# Patient Record
Sex: Male | Born: 1945 | Race: Black or African American | Hispanic: No | Marital: Single | Smoking: Current every day smoker
Health system: Southern US, Community
[De-identification: ages and names within clinical notes are randomized; demographics above are authoritative.]

## PROBLEM LIST (undated history)

## (undated) DIAGNOSIS — N170 Acute kidney failure with tubular necrosis: Secondary | ICD-10-CM

## (undated) DIAGNOSIS — R0602 Shortness of breath: Secondary | ICD-10-CM

## (undated) DIAGNOSIS — I469 Cardiac arrest, cause unspecified: Secondary | ICD-10-CM

## (undated) DIAGNOSIS — G43909 Migraine, unspecified, not intractable, without status migrainosus: Secondary | ICD-10-CM

## (undated) DIAGNOSIS — J9621 Acute and chronic respiratory failure with hypoxia: Secondary | ICD-10-CM

## (undated) DIAGNOSIS — S62109A Fracture of unspecified carpal bone, unspecified wrist, initial encounter for closed fracture: Secondary | ICD-10-CM

## (undated) DIAGNOSIS — B2790 Infectious mononucleosis, unspecified without complication: Secondary | ICD-10-CM

## (undated) DIAGNOSIS — D229 Melanocytic nevi, unspecified: Secondary | ICD-10-CM

## (undated) DIAGNOSIS — K635 Polyp of colon: Secondary | ICD-10-CM

## (undated) DIAGNOSIS — R413 Other amnesia: Secondary | ICD-10-CM

## (undated) DIAGNOSIS — I482 Chronic atrial fibrillation, unspecified: Secondary | ICD-10-CM

## (undated) DIAGNOSIS — R21 Rash and other nonspecific skin eruption: Secondary | ICD-10-CM

## (undated) DIAGNOSIS — I10 Essential (primary) hypertension: Secondary | ICD-10-CM

## (undated) DIAGNOSIS — G931 Anoxic brain damage, not elsewhere classified: Secondary | ICD-10-CM

## (undated) HISTORY — DX: Melanocytic nevi, unspecified: D22.9

## (undated) HISTORY — DX: Other amnesia: R41.3

## (undated) HISTORY — DX: Essential (primary) hypertension: I10

## (undated) HISTORY — DX: Infectious mononucleosis, unspecified without complication: B27.90

## (undated) HISTORY — DX: Fracture of unspecified carpal bone, unspecified wrist, initial encounter for closed fracture: S62.109A

## (undated) HISTORY — DX: Migraine, unspecified, not intractable, without status migrainosus: G43.909

## (undated) HISTORY — PX: TRIGGER FINGER RELEASE: SHX641

## (undated) HISTORY — DX: Shortness of breath: R06.02

## (undated) HISTORY — DX: Polyp of colon: K63.5

## (undated) HISTORY — DX: Rash and other nonspecific skin eruption: R21

---

## 2000-10-24 ENCOUNTER — Emergency Department (HOSPITAL_COMMUNITY): Admission: EM | Admit: 2000-10-24 | Discharge: 2000-10-24 | Payer: Self-pay | Admitting: Emergency Medicine

## 2000-10-25 ENCOUNTER — Ambulatory Visit (HOSPITAL_COMMUNITY): Admission: RE | Admit: 2000-10-25 | Discharge: 2000-10-25 | Payer: Self-pay

## 2002-06-22 ENCOUNTER — Ambulatory Visit (HOSPITAL_COMMUNITY): Admission: RE | Admit: 2002-06-22 | Discharge: 2002-06-22 | Payer: Self-pay | Admitting: Gastroenterology

## 2002-06-22 ENCOUNTER — Encounter (INDEPENDENT_AMBULATORY_CARE_PROVIDER_SITE_OTHER): Payer: Self-pay | Admitting: *Deleted

## 2004-06-05 ENCOUNTER — Encounter: Admission: RE | Admit: 2004-06-05 | Discharge: 2004-06-05 | Payer: Self-pay | Admitting: Gastroenterology

## 2005-01-13 ENCOUNTER — Ambulatory Visit (HOSPITAL_COMMUNITY): Admission: RE | Admit: 2005-01-13 | Discharge: 2005-01-13 | Payer: Self-pay | Admitting: Gastroenterology

## 2005-01-13 ENCOUNTER — Encounter (INDEPENDENT_AMBULATORY_CARE_PROVIDER_SITE_OTHER): Payer: Self-pay | Admitting: *Deleted

## 2007-04-02 ENCOUNTER — Emergency Department (HOSPITAL_COMMUNITY): Admission: EM | Admit: 2007-04-02 | Discharge: 2007-04-02 | Payer: Self-pay | Admitting: Emergency Medicine

## 2007-07-31 ENCOUNTER — Ambulatory Visit: Payer: Self-pay | Admitting: Pulmonary Disease

## 2007-08-15 ENCOUNTER — Ambulatory Visit (HOSPITAL_BASED_OUTPATIENT_CLINIC_OR_DEPARTMENT_OTHER): Admission: RE | Admit: 2007-08-15 | Discharge: 2007-08-15 | Payer: Self-pay | Admitting: Pulmonary Disease

## 2007-08-18 ENCOUNTER — Ambulatory Visit: Payer: Self-pay | Admitting: Pulmonary Disease

## 2009-07-25 ENCOUNTER — Encounter: Payer: Self-pay | Admitting: Internal Medicine

## 2009-08-23 DIAGNOSIS — E785 Hyperlipidemia, unspecified: Secondary | ICD-10-CM

## 2009-08-23 DIAGNOSIS — G43909 Migraine, unspecified, not intractable, without status migrainosus: Secondary | ICD-10-CM | POA: Insufficient documentation

## 2009-08-23 DIAGNOSIS — I10 Essential (primary) hypertension: Secondary | ICD-10-CM | POA: Insufficient documentation

## 2009-09-03 ENCOUNTER — Encounter (INDEPENDENT_AMBULATORY_CARE_PROVIDER_SITE_OTHER): Payer: Self-pay | Admitting: *Deleted

## 2010-12-28 ENCOUNTER — Encounter: Payer: Self-pay | Admitting: Family Medicine

## 2011-04-20 NOTE — Letter (Signed)
July 31, 2007    Gabriel Earing, M.D.  [address]  [place], [place] [address]   RE:  Christopher [name], Christopher [name]  MRN:  [id number]  /  DOB:  Dec 06, 1946   Dear Dr. Andi Devon:   Thank you for this referral.  As you are aware, Christopher [name] is a pleasant  65 year old African-American gentleman who works as a Administrator at the  [employer].  He suffers from terrible migraines for the past several  years and he recently had an episode where he almost passed out.  He  describes choking episodes that have, on occasion, woken him up from  sleep.  These have been witnessed by his girlfriend.  However, she has  never described apneas.  He reports that his Epworth sleepiness score is  12 to 15, which means that he is a very sleepy person.  His usual  bedtime is anywhere from 10 p.m. to midnight.  Sleep latency is about 15  minutes.  He awakens a couple of times during the night, including  bathroom visits without any post-void sleep latency.  He wakes up at  5:15 a.m. feeling tired and groggy, but is able to get going within an  hour's time.  On weekends, he will stay in bed until 7 a.m.  On  occasions, migraines have woken him up.  He drinks about 2 cups of  coffee per day and is trying to decrease this.  He describes vivid  dreams after he started taking the Neurontin and has now cut down to 1/2  tablet a day.  He says that he works outdoors all day.  He will come  home and take a nap for about an hour or 2 every day.  He has gained  about 5 pounds in the past 2 years.   PAST MEDICAL HISTORY:  Includes hypertension, hyperlipidemia, migraine  headaches that seem to have returned since he came back to reside in  [place] in 1997.   ALLERGIES:  PENICILLIN.   CURRENT MEDICATIONS:  1. Gabapentin 300 mg b.i.d.  2. Valtrex 400 mg daily.  3. Lisinopril hydrochlorothiazide 10/12.5 mg daily.  4. Omeprazole 20 mg daily.  5. Aspirin 81 mg daily.  6. Levitra 20 mg p.r.n.  7. Baclofen 10 mg daily.   SOCIAL HISTORY:  He now smokes about a pack per day.  Has smoked about  50 pack years.  He is divorced and lives with his girlfriend.   FAMILY HISTORY:  Heart disease in his mother, emphysema in his maternal  uncle.   REVIEW OF SYSTEMS:  Includes loud snoring, sleepiness during various  activities and during driving.  There is no indigestion, teeth grinding  or jaw tightening in his sleep.   PHYSICAL EXAM:  Weight 182 pounds, blood pressure 150/82.  Heart rate is  75 per minute, oxygen saturation is 94% on room air.  HEENT:  Normal oropharyngeal space.  NECK:  Circumference 17 inches.  CVS:  S1, S2 normal.  CHEST:  Faint rhonchi in the left upper lobe.  Otherwise, clear to  auscultation.  ABDOMEN:  Soft.  NEUROLOGIC:  Nonfocal.  EXTREMITIES:  No edema.   MRI showed small vessel ischemic changes.   IMPRESSION AND PLAN:  1. Given his hypersomnolence and chocking episodes in his sleep,      obstructive sleep apnea is probable and an overnight polysomnogram      will be scheduled.  The pathophysiology of sleep apnea, its      cardiovascular consequences,  and modes of treatment, including CPAP      were discussed with the patient in detail.  Some reading material      was provided.  2. Given his extensive smoking, spirometry will be scheduled at a      future date.  Smoking cessation was discussed.  He will try to      decrease by 1 cigarette every week, hoping to quit by Christmas.    Sincerely,      Christopher Milch, MD  Electronically Signed    RVA/MedQ  DD: 07/31/2007  DT: 08/01/2007  Job #: [id number]   CC:    Gabriel Earing, M.D.

## 2011-04-20 NOTE — Procedures (Signed)
NAMEANTHONEY, Christopher [name]              ACCOUNT NO.:  [id number]   MEDICAL RECORD NO.:  [id number]          PATIENT TYPE:  OUT   LOCATION:  SLEEP CENTER                 FACILITY:  [facility]   PHYSICIAN:  Oretha Milch, MD      DATE OF BIRTH:  [birth year]   DATE OF STUDY:  08/15/2007                            NOCTURNAL POLYSOMNOGRAM   REFERRING PHYSICIAN:   SUBJECTIVE DATA:  Christopher [name] is a 65 year old smoker with excessive  daytime somnolence, loud snoring and choking episodes during sleep.  Height is 5 feet 8 inches, weight is 176 pounds.   EPWORTH SLEEPINESS SCORE:  12 and neck size 16 inches.   MEDICATIONS:  At home include lisinopril, Valtrex, aspirin.   This intervention polysomnogram was performed with a sleep technologist  in attendance.  EEG, EOG, EMG, EKG, respiratory parameters, body  position and oximetry date were recorded.  Sleep stages, arousals and  limb movements were scored according to criteria from the American  Academy of Sleep Medicine.   SLEEP ARCHITECTURE:  Lights out was at 10:34 p.m., and lights on was  5:36 a.m.  Sleep period time was 363 minutes, with a total sleep time of  318 minutes, leading to a sleep maintenance efficiency of 88% and awake  during sleep of 46.5 minutes.  Sleep onset latency was 37.5 minutes.  REM latency was 48 minutes.  Sleep states as a percentage of total sleep  time was 9% in one sleep, 66% in two, zero percent in three and 25% of  REM sleep.  Cyclic REM periods were seen throughout the night.   AROUSAL DATA:  There were a total of 49 arousals, of which 42 were  nonspecific, and 7 were associated with a respiratory event.   RESPIRATORY PARAMETERS:  During the baseline period of 180.5 minutes,  132 minutes of sleep were recorded, including 26 minutes of REM sleep  and 121 minutes of supine sleep.  There were a total of 11 obstructive  apneas, 4 central apneas, zero mixed apneas and 34 hypopneas, leading to  a total AHI of 22.3 per  hour, with an arousal index of 14.5 per hour and  a lowest desaturation of 83% during non-REM sleep and 55% during REM  sleep.   Due to this degree of respiratory disturbance, CPAP was initiated at +5  centimeters, at +6 centimeters for 21.9 minutes of non-REM sleep.  The  patient did not have any events.  CPAP was titrated due to snoring, to  +9 centimeters.  At this level, he achieved 37 minutes of REM sleep and  52 minutes of non-REM sleep and spent 15.8 minutes supine.  No events  were recorded.   OXYGEN DATA:  The lowest saturation was 84% during REM sleep with a  respiratory event.  He spent a total 53.2 minutes, with a saturation  less than 90%.   CARDIAC DATA:  The lowest heart rate during non-REM sleep was 45 beats  per minute and 50 beats per minute during REM sleep.  Highest heart rate  was 83 beats per minutes during non-REM sleep and REM sleep.   PERIODIC LIMB MOVEMENT DATA:  Four limb  movements were noted, and these  are not associated with arousal.   DISCUSSION:  He seemed to tolerate the CPAP well, after an initial  period of restlessness.  A full facial Fisher Paykel mask was preferred.  ST depression was noted in the EKG lead.   IMPRESSION:  1. Moderate obstructive sleep apnea and hypopnea, causing arousals and      sleep fragmentation.  This was corrected with a CPAP of +9 cm,      which he seemed to tolerate quite well.  2. No significant periodic limb movements are seen.  3. No arrhythmias are noted.  ST depression was noted on the EKG lead.      Please correlate with history of cardiac ischemia and a 12-lead      EKG.   RECOMMENDATION:  1. CPAP of +9 centimeters with full face Fisher Paykel mask.  He      should be followed up to ensure compliance.  2. Maintain lean body weight.      Oretha Milch, MD  Electronically Signed     RVA/MEDQ  D:  08/18/2007 13:26:05  T:  08/19/2007 09:39:29  Job:  [id number]   cc:   Gabriel Earing, M.D.  Fax: [phone]

## 2011-04-23 NOTE — Op Note (Signed)
[name], [name]              ACCOUNT NO.:  [id number]   MEDICAL RECORD NO.:  [id number]          PATIENT TYPE:  AMB   LOCATION:  ENDO                         FACILITY:  [facility]   PHYSICIAN:  Anselmo Rod, M.D.  DATE OF BIRTH:  07-18-1946   DATE OF PROCEDURE:  01/13/2005  DATE OF DISCHARGE:                                 OPERATIVE REPORT   PROCEDURE PERFORMED:  Esophagogastroduodenoscopy with multiple biopsies.   ENDOSCOPIST:  Charna Elizabeth, M.D.   INSTRUMENT USED:  Olympus video panendoscope.   INDICATIONS FOR PROCEDURE:  The patient is a 65 year old African-American  male with a history of dysphagia and a history of reflux, rule out peptic  stricture, esophagitis, etc.  The patient had a barium swallow that showed  some decreased motility in the proximal esophagus.  Manometry today revealed  slightly elevated upper esophageal sphincter pressures but no evidence of  achalasia.   PREPROCEDURE PREPARATION:  Informed consent was procured from the patient.  The patient was fasted for eight hours prior to the procedure.   PREPROCEDURE PHYSICAL:  The patient had stable vital signs.  Neck supple,  chest clear to auscultation.  S1, S2 regular.  Abdomen soft with normal  bowel sounds.   DESCRIPTION OF PROCEDURE:  The patient was placed in the left lateral  decubitus position and sedated with 40 mg of Demerol and 4 mg of Versed in  slow incremental doses.  Once the patient was adequately sedated and  maintained on low-flow oxygen and continuous cardiac monitoring, the Olympus  video panendoscope was advanced through the mouth piece over the tongue into  the esophagus under direct vision.  The entire esophagus appeared widely  patent with no evidence of stricture, or esophagitis.  There was Barrett's  like mucosa noted in the distal esophagus.  It was biopsied for pathology.  There was diffuse gastritis noted throughout the gastric mucosa with more  prominent changes in the  proximal stomach that was biopsied for pathology as  well to rule out Helicobacter pylori.  Two small sessile  polyps were seen  in the antrum and were biopsied as well.  Proximal small bowel showed severe  duodenitis in the duodenal bulb with small linear ulceration.  Small bowel  distal to the bulb appeared normal up to 60 cm.   IMPRESSION:  1.  Widely patent esophagus.  2.  Barrett's like changes in the distal esophagus, biopsies done, results      pending.  3.  Diffuse gastritis, biopsies done to rule out Helicobacter pylori from      proximal stomach.  4.  Two small sessile polyps from the antrum biopsied for pathology.  5.  Severe duodenitis in the duodenal bulb.  6.  Normal small bowel distal to the bulb up to 60 cm.   RECOMMENDATIONS:  1.  Await pathology results.  2.  Avoid all nonsteroidals including aspirin for now.  3.  Outpatient followup in the next two weeks for further recommendation.      JNM/MEDQ  D:  01/13/2005  T:  01/13/2005  Job:  [id number]   cc:  Gabriel Earing, M.D.  [address]  [place]  [place] [address]  Fax: [phone]   Kathy Breach, M.D.  [address] [place]  [place] [address]  Fax: [phone]

## 2011-04-23 NOTE — Procedures (Signed)
[place]  Patient:    Christopher [name], Christopher [name] Visit Number: [id number] MRN: [id number]          Service Type: END Location: ENDO Attending Physician:  Charna Elizabeth Dictated by:   Anselmo Rod, M.D. Proc. Date: 06/22/02 Admit Date:  06/22/2002 Discharge Date: 06/22/2002   CC:         Earlene Plater L. Cloward, M.D.   Procedure Report  DATE OF BIRTH:  [birth year].  PROCEDURE:  Colonoscopy with snare polypectomy x6.  ENDOSCOPIST:  Anselmo Rod, M.D.  INSTRUMENT USED:  Olympus video colonoscope.  INDICATION FOR PROCEDURE:  A 65 year old African-American male with a history of rectal bleeding and a personal history of polyps removed in 1996.  Rule out recurrent polyps.  PREPROCEDURE PREPARATION:  Informed consent was procured from the patient. The patient was fasted for eight hours prior to the procedure and prepped with a bottle of magnesium citrate and a gallon of NuLytely the night prior to the procedure.  PREPROCEDURE PHYSICAL:  VITAL SIGNS:  The patient had stable vital signs.  NECK:  Supple.  CHEST:  Clear to auscultation.  S1, S2 regular.  ABDOMEN:  Soft with normal bowel sounds.  DESCRIPTION OF PROCEDURE:  The patient was placed in the left lateral decubitus position and sedated with 50 mg of Demerol and 6 mg of Versed intravenously.  Once the patient was adequately sedate and maintained on low-flow oxygen and continuous cardiac monitoring, the Olympus video colonoscope was advanced from the rectum to the cecum.  The patient had prominent bleeding internal hemorrhoids appreciated on retroflexion.  There was evidence of left-sided diverticulosis.  Multiple small sessile polyps were snared from 20-25 cm.  The rest of the colon up to the cecum appeared normal.  IMPRESSION: 1. Prominent bleeding internal hemorrhoids. 2. Left-sided diverticulosis. 3. Multiple polyps removed from 20-25 cm. 4. Otherwise normal-appearing colon,  including transverse colon, right    colon, cecum, and terminal ileum.  RECOMMENDATIONS: 1. Await pathology results. 2. Avoid all nonsteroidals, including aspirin, for the next three weeks. 3. Anusol-HC 2.5% suppositories one p.r. q.h.s., #30 with one refill have    been prescribed. 4. Sitz baths have been advocated. 5. Outpatient follow-up in the next seven to 10 days. Dictated by:   Anselmo Rod, M.D. Attending Physician:  Charna Elizabeth DD:  06/22/02 TD:  06/27/02 Job: [id number]

## 2011-06-07 ENCOUNTER — Encounter (INDEPENDENT_AMBULATORY_CARE_PROVIDER_SITE_OTHER): Payer: Self-pay | Admitting: General Surgery

## 2011-06-07 ENCOUNTER — Ambulatory Visit (INDEPENDENT_AMBULATORY_CARE_PROVIDER_SITE_OTHER): Payer: 59 | Admitting: General Surgery

## 2011-06-07 VITALS — BP 146/82 | HR 62 | Temp 96.3°F | Ht 68.0 in | Wt 183.6 lb

## 2011-06-07 DIAGNOSIS — D229 Melanocytic nevi, unspecified: Secondary | ICD-10-CM

## 2011-06-07 DIAGNOSIS — K649 Unspecified hemorrhoids: Secondary | ICD-10-CM

## 2011-06-07 DIAGNOSIS — D239 Other benign neoplasm of skin, unspecified: Secondary | ICD-10-CM

## 2011-06-07 NOTE — Progress Notes (Signed)
Subjective:     Patient ID: Christopher [name], male   DOB: [birth year], 65 y.o.   MRN: [id number]    BP 146/82  Pulse 62  Temp 96.3 F (35.7 C)  Ht 5\' 8"  (1.727 m)  Wt 183 lb 9.6 oz (83.28 kg)  BMI 27.92 kg/m2    HPI Comments: He is a 65 year old male referred by Dr. Andi Devon or evaluation of moles and hemorrhoids. He has a mole on his left shoulder that is getting larger. He has a mole on his left axilla is getting larger as well. He also noted a mole in his right groin area.  He has a history of hemorrhoidal disease. He had rubber band ligation of an internal hemorrhoid and 2009. He states when he strains to have a bowel movement he sometimes has bleeding, itching, and discomfort. If he does not strain they are asymptomatic.    Review of Systems  Constitutional: Negative.   Respiratory: Positive for shortness of breath.   Cardiovascular: Negative.   Genitourinary: Positive for genital sores.       Mole  Hematological: Negative.        Objective:   Physical Exam  Constitutional: He appears well-developed. No distress.       Overweight.  Genitourinary:       External hemorrhoid present with no thrombosis.  Anoscopy:  Small right anterolateral internal hemorrhoid; moderated size left internal hemorrhoid.  No fissure.  Skin: Skin is warm and dry.       1 cm dark irregular moles on left shoulder and axilla.  2 cm dark, irregular mole in right groin crease.       Assessment:     #1. Atypical skin moles.  #2. Hemorrhoidal disease only symptomatic when he has to strain to have a bowel movement.    Plan:     #1. Increase fiber in diet.  #2. Removal of atypical mole on left shoulder and axilla. Punch biopsy of atypical mole in the right groin. Will arrange for office surgery.

## 2011-07-01 ENCOUNTER — Ambulatory Visit (INDEPENDENT_AMBULATORY_CARE_PROVIDER_SITE_OTHER): Payer: 59 | Admitting: General Surgery

## 2014-05-27 ENCOUNTER — Encounter: Payer: Self-pay | Admitting: Podiatry

## 2014-05-27 ENCOUNTER — Ambulatory Visit: Payer: Medicare Other | Admitting: Podiatry

## 2014-05-27 VITALS — BP 138/60 | HR 70 | Resp 16 | Ht 68.0 in | Wt 178.0 lb

## 2014-05-27 DIAGNOSIS — L600 Ingrowing nail: Secondary | ICD-10-CM

## 2014-05-27 DIAGNOSIS — L84 Corns and callosities: Secondary | ICD-10-CM

## 2014-05-27 DIAGNOSIS — M216X9 Other acquired deformities of unspecified foot: Secondary | ICD-10-CM

## 2014-05-27 NOTE — Progress Notes (Signed)
Subjective:     Patient ID: Christopher [name], male   DOB: August 19, 1946, 68 y.o.   MRN: [id number]  Toe Pain    patient presents with ingrown toenail borders the medial and lateral hallux of both feet that can become painful and keratotic lesions distal lateral aspect of the fifth toes which she cannot cut and also can become tender along with the head of the fifth metatarsal bones   Review of Systems  All other systems reviewed and are negative.      Objective:   Physical Exam  Nursing note and vitals reviewed. Constitutional: He is oriented to person, place, and time.  Cardiovascular: Intact distal pulses.   Musculoskeletal: Normal range of motion.  Neurological: He is oriented to person, place, and time.  Skin: Skin is dry.   neurovascular status found to be intact with mild varicosities in the ankle region of both feet. Range of motion subtalar midtarsal joint adequate and muscle strength within normal limits. Patient is found to have incurvated hallux nails of both feet with procedure that looks like he was doing years ago with moderate spicule regrowth and also keratotic lesions fifth metatarsal both feet and distal lateral fifth toe both feet     Assessment:     Ingrown toenail deformity hallux of both feet along with plantarflexed metatarsal with keratotic lesion and distal lateral lesion secondary to digital rotation    Plan:     H&P and conditions. Patient wants to have procedures done ingrown toenails we are scheduling him next week and today I debrided all remaining tissue which she tolerated well and will be seen back as needed

## 2014-05-27 NOTE — Progress Notes (Signed)
   Subjective:    Patient ID: Christopher [name], male    DOB: 1946/07/18, 68 y.o.   MRN: [id number]  HPI Comments: "I have ingrown toenails"  Patient c/o tender 1st toes bilateral, both borders, right over left, for several months. Also, the 5th toes bilateral, lateral borders too. Usually gets regular pedicures to have them trim out the corners. Would like to have procedures to permanently fix, but maybe next week. Works at [employer] and has to work this whole week.  Toe Pain       Review of Systems  HENT: Positive for sinus pressure.   Respiratory: Positive for wheezing.   Skin: Positive for rash.  All other systems reviewed and are negative.      Objective:   Physical Exam        Assessment & Plan:

## 2014-06-24 ENCOUNTER — Encounter: Payer: Self-pay | Admitting: Podiatry

## 2014-06-24 ENCOUNTER — Ambulatory Visit (INDEPENDENT_AMBULATORY_CARE_PROVIDER_SITE_OTHER): Payer: Medicare Other | Admitting: Podiatry

## 2014-06-24 VITALS — BP 138/75 | HR 73 | Resp 16

## 2014-06-24 DIAGNOSIS — L600 Ingrowing nail: Secondary | ICD-10-CM

## 2014-06-24 MED ORDER — HYDROCODONE-ACETAMINOPHEN 10-325 MG PO TABS: 1.0000 | ORAL_TABLET | Freq: Three times a day (TID) | ORAL | Status: DC | PRN

## 2014-06-24 NOTE — Patient Instructions (Addendum)

## 2014-06-24 NOTE — Progress Notes (Signed)
Subjective:     Patient ID: Christopher [name], male   DOB: 22-Jun-1946, 68 y.o.   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 Patient wants procedures and today I infiltrated each hallux 60 mg Xylocaine Marcaine mixture remove the medial and lateral corners exposed matrix and apply chemical phenol 3 applications 30 seconds to each border followed by alcohol lavaged and sterile dressing. Reappoint to recheck

## 2016-07-15 ENCOUNTER — Other Ambulatory Visit: Payer: Self-pay | Admitting: Orthopedic Surgery

## 2016-07-15 DIAGNOSIS — M5417 Radiculopathy, lumbosacral region: Secondary | ICD-10-CM

## 2016-07-20 ENCOUNTER — Ambulatory Visit
Admission: RE | Admit: 2016-07-20 | Discharge: 2016-07-20 | Disposition: A | Discharge status: Home / Self Care | Payer: Medicare Other | Source: Ambulatory Visit | Attending: Orthopedic Surgery | Admitting: Orthopedic Surgery

## 2016-07-20 DIAGNOSIS — M5417 Radiculopathy, lumbosacral region: Secondary | ICD-10-CM

## 2016-12-09 ENCOUNTER — Ambulatory Visit: Payer: Medicare Other | Admitting: Neurology

## 2016-12-17 ENCOUNTER — Ambulatory Visit: Payer: Medicare Other | Admitting: Neurology

## 2017-01-04 ENCOUNTER — Ambulatory Visit (INDEPENDENT_AMBULATORY_CARE_PROVIDER_SITE_OTHER): Payer: Medicare Other | Admitting: Neurology

## 2017-01-04 ENCOUNTER — Encounter: Payer: Self-pay | Admitting: Neurology

## 2017-01-04 VITALS — BP 167/76 | HR 68 | Ht 68.0 in | Wt 181.5 lb

## 2017-01-04 DIAGNOSIS — R413 Other amnesia: Secondary | ICD-10-CM | POA: Insufficient documentation

## 2017-01-04 DIAGNOSIS — E538 Deficiency of other specified B group vitamins: Secondary | ICD-10-CM | POA: Diagnosis not present

## 2017-01-04 DIAGNOSIS — R5382 Chronic fatigue, unspecified: Secondary | ICD-10-CM | POA: Diagnosis not present

## 2017-01-04 HISTORY — DX: Other amnesia: R41.3

## 2017-01-04 NOTE — Patient Instructions (Signed)
   We will get blood work today, and get MRI of the brain. Let me know if you wish a medication for memory.

## 2017-01-04 NOTE — Progress Notes (Signed)
Reason for visit: Memory disturbance  Referring physician: Dr. Don Perking is a 71 y.o. male  History of present illness:  Christopher [name] is a 71 year old right-handed black male with a history of a memory disturbance that goes back 6-9 months. Christopher [name] has had increasing problems with repeating himself with questions. He has some short-term memory issues. Christopher [name] denies any issues with driving with directions, he is having some problems with focusing on where he is going, however. He does not get lost. Christopher [name] does have some mild word finding problems. He reports no issues with keeping up with finances. Sometimes he will have troubles remembering whether or not he took his medications, he denies any issues keeping up with appointments. Christopher [name] gets about 4 hours of sleep at night, he will often times wake up and cannot get back to sleep. Christopher [name] may sleep occasionally during Christopher daytime. Christopher [name] does not know whether or not he snores at night. He reports no numbness or weakness of Christopher extremities, he has some urgency of Christopher bladder, he denies problems controlling Christopher bowels. Both of his parents lived into Christopher late 35s or 78s with mild memory problems prior to their deaths. Otherwise, no significant history of memory issues are in Christopher family. He is sent to this office for an evaluation.  Past Medical History:  Diagnosis Date  . Colon polyp   . Hypertension   . 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 Migraine   . Mononucleosis 1990s  . Rash   . Shortness of breath ~1998   Pneumonia   . Skin moles    abnormal  . Wrist fracture    Childhood    Past Surgical History:  Procedure Laterality Date  . TRIGGER FINGER RELEASE Right     Family History  Problem Relation Age of Onset  . Heart disease Mother     Died at age 59  . Parkinson's disease Mother   . Parkinson's disease Brother     Social history:  reports that he has been smoking.  He has been smoking  about 0.75 packs per day. He has never used smokeless tobacco. He reports that he drinks alcohol. He reports that he does not use drugs.  Medications:  Prior to Admission medications   Medication Sig Start Date End Date Taking? Authorizing Provider  albuterol (PROAIR HFA) 108 (90 Base) MCG/ACT inhaler INHALE 2 PUFFS INTO Christopher LUNGS EVERY 6 (SIX) HOURS AS NEEDED FOR WHEEZING. 12/22/13  Yes Historical Provider, MD  aspirin 81 MG tablet Take 81 mg by mouth daily.     Yes Historical Provider, MD  Docusate Calcium (STOOL SOFTENER PO) Take by mouth.   Yes Historical Provider, MD  losartan-hydrochlorothiazide (HYZAAR) 100-12.5 MG per tablet Take 1 tablet by mouth daily.     Yes Historical Provider, MD  Multiple Vitamin (MULTIVITAMIN) tablet Take 1 tablet by mouth daily.   Yes Historical Provider, MD  Multiple Vitamins-Minerals ([employer]S SPECTRAVITE PO) Take by mouth.   Yes Historical Provider, MD  Omega-3 Fatty Acids (FISH OIL PO) Take by mouth.   Yes Historical Provider, MD  valACYclovir (VALTREX) 1000 MG tablet Take 500 mg by mouth daily.     Yes Historical Provider, MD  SYMBICORT 80-4.5 MCG/ACT inhaler INHALE TWO PUFFS INTO Christopher LUNGS 2 (TWO) TIMES DAILY. 12/05/16   Historical Provider, MD      Allergies  Allergen Reactions  . Penicillins Rash    ROS:  Out of a  complete 14 system review of symptoms, Christopher [name] complains only of Christopher following symptoms, and all other reviewed systems are negative.  Fatigue Ringing in Christopher ears Skin rash, moles Shortness of breath, cough, wheezing Increased thirst Joint pain  Blood pressure (!) 167/76, pulse 68, height 5\' 8"  (1.727 m), weight 181 lb 8 oz (82.3 kg).  Physical Exam  General: Christopher [name] is alert and cooperative at Christopher time of Christopher examination.  Eyes: Pupils are equal, round, and reactive to light. Discs are flat bilaterally.  Neck: Christopher neck is supple, no carotid bruits are noted.  Respiratory: Christopher respiratory examination is  clear.  Cardiovascular: Christopher cardiovascular examination reveals a regular rate and rhythm, no obvious murmurs or rubs are noted.  Skin: Extremities are without significant edema.  Neurologic Exam  Mental status: Christopher [name] is alert and oriented x 3 at Christopher time of Christopher examination. Christopher [name] has apparent normal recent and remote memory, with an apparently normal attention span and concentration ability. Mini-Mental Status Examination done today shows total score 28/30.  Cranial nerves: Facial symmetry is present. There is good sensation of Christopher face to pinprick and soft touch bilaterally. Christopher strength of Christopher facial muscles and Christopher muscles to head turning and shoulder shrug are normal bilaterally. Speech is well enunciated, no aphasia or dysarthria is noted. Extraocular movements are full. Visual fields are full. Christopher tongue is midline, and Christopher [name] has symmetric elevation of Christopher soft palate. No obvious hearing deficits are noted. A mild vocal tremor is noted.  Motor: Christopher motor testing reveals 5 over 5 strength of all 4 extremities. Good symmetric motor tone is noted throughout.  Sensory: Sensory testing is intact to pinprick, soft touch, vibration sensation, and position sense on all 4 extremities. No evidence of extinction is noted.  Coordination: Cerebellar testing reveals good finger-nose-finger and heel-to-shin bilaterally.  Gait and station: Gait is normal. Tandem gait is normal. Romberg is negative. No drift is seen.  Reflexes: Deep tendon reflexes are symmetric and normal bilaterally. Toes are downgoing bilaterally.   Assessment/Plan:  1. Mild memory disturbance  Christopher [name] will be evaluated for Christopher memory issue. Christopher [name] has noted changes in Christopher last 6-9 months. Christopher [name] does not wish to initiate medication for memory at this time. We will check MRI of Christopher brain, and have blood work done today. I will contact Christopher [name] regarding Christopher results. Christopher [name] will follow-up in  6 months.  Jill Alexanders MD 01/04/2017 9:11 AM  [facility] [address] [place], [place] [address]  Phone [phone] Fax [phone]

## 2017-01-05 LAB — HIV ANTIBODY (ROUTINE TESTING W REFLEX): HIV Screen 4th Generation wRfx: NONREACTIVE

## 2017-01-05 LAB — RPR: RPR Ser Ql: NONREACTIVE

## 2017-01-05 LAB — VITAMIN B12: Vitamin B-12: 947 pg/mL (ref 232–1245)

## 2017-01-05 LAB — TSH: TSH: 1.24 u[IU]/mL (ref 0.450–4.500)

## 2017-01-05 LAB — SEDIMENTATION RATE: Sed Rate: 7 mm/hr (ref 0–30)

## 2017-01-13 ENCOUNTER — Ambulatory Visit
Admission: RE | Admit: 2017-01-13 | Discharge: 2017-01-13 | Disposition: A | Discharge status: Home / Self Care | Payer: Medicare Other | Source: Ambulatory Visit | Attending: Neurology | Admitting: Neurology

## 2017-01-13 DIAGNOSIS — R413 Other amnesia: Secondary | ICD-10-CM

## 2017-01-15 ENCOUNTER — Telehealth: Payer: Self-pay | Admitting: Neurology

## 2017-01-15 NOTE — Telephone Encounter (Signed)
Mild SVD noted. If the patient wishes to go on aricept, he is to call.   MRI brain 01/12/17:  IMPRESSION:  This MRI of the brain without contrast shows the following: 1.   Scattered T2/FLAIR hyperintense foci in the pons and bilateral hemispheres consistent with chronic microvascular ischemic change.  This has progressed when compared to the 06/16/2007 MRI. 2.   Brain volume is normal for age.  3.   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   There are no acute findings.

## 2017-07-19 ENCOUNTER — Ambulatory Visit: Payer: Medicare Other | Admitting: Adult Health

## 2017-10-06 ENCOUNTER — Encounter: Payer: Self-pay | Admitting: Adult Health

## 2017-10-06 ENCOUNTER — Ambulatory Visit (INDEPENDENT_AMBULATORY_CARE_PROVIDER_SITE_OTHER): Payer: Medicare Other | Admitting: Adult Health

## 2017-10-06 ENCOUNTER — Ambulatory Visit: Payer: Medicare Other | Admitting: Adult Health

## 2017-10-06 VITALS — BP 158/67 | HR 78 | Wt 180.0 lb

## 2017-10-06 DIAGNOSIS — R413 Other amnesia: Secondary | ICD-10-CM

## 2017-10-06 NOTE — Progress Notes (Signed)
PATIENT: Christopher [name] DOB: 1946-04-24  REASON FOR VISIT: follow up- memory HISTORY FROM: patient  HISTORY OF PRESENT ILLNESS: Today 10/06/17 Christopher [name] is a 71 year old male with a history of memory disturbance.  He returns today for follow-up.  He reports that his memory has remained approximately the same.  He lives with his 3 year old daughter.  He is able to complete most ADLs independently.  He denies any difficulty driving with the exception that he has some trouble at night.  He states that he tends to use the GPS a lot.  Denies any trouble sleeping.  Denies any changes with his appetite.  Denies any changes in his mood or behavior.  He reports they recently went on a cruise and his fiance had bought tickets for an excursion however he did not recall her telling him this and he bought tickets to do something different with his daughters.  He reports in the past he would have never done anything like that.  He reports that this is one isolated event that he recalls.  He denies any new neurological symptoms he returns today for an evaluation peer  HISTORY: Christopher [name] is a 56 year old right-handed black male with a history of a memory disturbance that goes back 6-9 months. The patient has had increasing problems with repeating himself with questions. He has some short-term memory issues. The patient denies any issues with driving with directions, he is having some problems with focusing on where he is going, however. He does not get lost. The patient does have some mild word finding problems. He reports no issues with keeping up with finances. Sometimes he will have troubles remembering whether or not he took his medications, he denies any issues keeping up with appointments. The patient gets about 4 hours of sleep at night, he will often times wake up and cannot get back to sleep. The patient may sleep occasionally during the daytime. The patient does not know whether or not he snores at  night. He reports no numbness or weakness of the extremities, he has some urgency of the bladder, he denies problems controlling the bowels. Both of his parents lived into the late 2s or 20s with mild memory problems prior to their deaths. Otherwise, no significant history of memory issues are in the family. He is sent to this office for an evaluation.  REVIEW OF SYSTEMS: Out of a complete 14 system review of symptoms, the patient complains only of the following symptoms, and all other reviewed systems are negative.  Apnea, testicular pain, rash, moles, itching  ALLERGIES: Allergies  Allergen Reactions  . Penicillins Rash    HOME MEDICATIONS: Outpatient Medications Prior to Visit  Medication Sig Dispense Refill  . albuterol (PROAIR HFA) 108 (90 Base) MCG/ACT inhaler INHALE 2 PUFFS INTO THE LUNGS EVERY 6 (SIX) HOURS AS NEEDED FOR WHEEZING.    Marland Kitchen aspirin 81 MG tablet Take 81 mg by mouth daily.      Mariane Baumgarten Calcium (STOOL SOFTENER PO) Take by mouth.    . losartan-hydrochlorothiazide (HYZAAR) 100-12.5 MG per tablet Take 1 tablet by mouth daily.      . Multiple Vitamin (MULTIVITAMIN) tablet Take 1 tablet by mouth daily.    . Multiple Vitamins-Minerals (CVS SPECTRAVITE PO) Take by mouth.    . Omega-3 Fatty Acids (FISH OIL PO) Take by mouth.    . SYMBICORT 80-4.5 MCG/ACT inhaler INHALE TWO PUFFS INTO THE LUNGS 2 (TWO) TIMES DAILY.  3  . valACYclovir (VALTREX) 1000  MG tablet Take 500 mg by mouth daily.       No facility-administered medications prior to visit.     PAST MEDICAL HISTORY: Past Medical History:  Diagnosis Date  . Colon polyp   . Hypertension   . 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 Migraine   . Mononucleosis 1990s  . Rash   . Shortness of breath ~1998   Pneumonia   . Skin moles    abnormal  . Wrist fracture    Childhood    PAST SURGICAL HISTORY: Past Surgical History:  Procedure Laterality Date  . TRIGGER FINGER RELEASE Right     FAMILY HISTORY: Family History    Problem Relation Age of Onset  . Heart disease Mother        Died at age 5  . Parkinson's disease Mother   . Parkinson's disease Brother     SOCIAL HISTORY: Social History   Social History  . Marital status: Single    Spouse name: N/A  . Number of children: 2  . Years of education: 16   Occupational History  . N/A    Social History Main Topics  . Smoking status: Current Every Day Smoker    Packs/day: 0.75  . Smokeless tobacco: Never Used  . Alcohol use Yes     Comment: Occasional  . Drug use: No  . Sexual activity: Not on file   Other Topics Concern  . Not on file   Social History Narrative   Lives at home w/ his daughter   Right-handed   Caffeine: 1 cup of coffee, 1 tea and 1 soda per day      PHYSICAL EXAM  Vitals:   10/06/17 0817  BP: (!) 158/67  Pulse: 78  Weight: 180 lb (81.6 kg)   Body mass index is 27.37 kg/m.  MMSE - Mini Mental State Exam 10/06/2017 01/04/2017  Orientation to time 5 4  Orientation to Place 5 5  Registration 3 3  Attention/ Calculation 4 5  Recall 1 2  Language- name 2 objects 2 2  Language- repeat 0 1  Language- follow 3 step command 3 3  Language- read & follow direction 1 1  Write a sentence 1 1  Copy design 1 1  Total score 26 28     Generalized: Well developed, in no acute distress   Neurological examination  Mentation: Alert oriented to time, place, history taking. Follows all commands speech and language fluent Cranial nerve II-XII: Pupils were equal round reactive to light. Extraocular movements were full, visual field were full on confrontational test. Facial sensation and strength were normal. Uvula tongue midline. Head turning and shoulder shrug  were normal and symmetric. Motor: The motor testing reveals 5 over 5 strength of all 4 extremities. Good symmetric motor tone is noted throughout.  Sensory: Sensory testing is intact to soft touch on all 4 extremities. No evidence of extinction is noted.   Coordination: Cerebellar testing reveals good finger-nose-finger and heel-to-shin bilaterally.  Gait and station: Gait is normal. Tandem gait is normal. Romberg is negative. No drift is seen.  Reflexes: Deep tendon reflexes are symmetric and normal bilaterally.   DIAGNOSTIC DATA (LABS, IMAGING, TESTING) - I reviewed patient records, labs, notes, testing and imaging myself where available.   Lab Results  Component Value Date   [id number] 01/04/2017   Lab Results  Component Value Date   TSH 1.240 01/04/2017      ASSESSMENT AND PLAN 71 y.o. year old male  has  a past medical history of Colon polyp; Hypertension; Memory disorder (01/04/2017); Migraine; Mononucleosis (1990s); Rash; Shortness of breath (~1998); Skin moles; and Wrist fracture. here with :  1.  Memory disturbance  The patient's memory score has declined slightly.  We discussed potentially starting Aricept.  I reviewed the medication and potential side effects with the patient.  At this time he would like to wait.  He states that he will reviewed this medication with his daughter and sister.  He will follow-up in 4 months or sooner if needed.  I spent 15 minutes with the patient. 50% of this time was spent discussing his memory score and the medication Aricept.      Ward Givens, MSN, NP-C 10/06/2017, 7:49 AM [facility] [address] [place], [place] [address] [phone]

## 2017-10-06 NOTE — Patient Instructions (Signed)
Your Plan:  Continue to monitor memory  Score today is 26/30 Consider Aricept  If your symptoms worsen or you develop new symptoms please let us know.   Thank you for coming to see [place] at [facility]. I hope we have been able to provide you high quality care today.  You may receive a patient satisfaction survey over the next few weeks. We would appreciate your feedback and comments so that we may continue to improve ourselves and the health of our patients.  Donepezil tablets What is this medicine? DONEPEZIL (doe NEP e zil) is used to treat mild to moderate dementia caused by Alzheimer's disease. This medicine may be used for other purposes; ask your health care provider or pharmacist if you have questions. COMMON BRAND NAME(S): Aricept What should I tell my health care provider before I take this medicine? They need to know if you have any of these conditions: -asthma or other lung disease -difficulty passing urine -head injury -heart disease -history of irregular heartbeat -liver disease -seizures (convulsions) -stomach or intestinal disease, ulcers or stomach bleeding -an unusual or allergic reaction to donepezil, other medicines, foods, dyes, or preservatives -pregnant or trying to get pregnant -breast-feeding How should I use this medicine? Take this medicine by mouth with a glass of water. Follow the directions on the prescription label. You may take this medicine with or without food. Take this medicine at regular intervals. This medicine is usually taken before bedtime. Do not take it more often than directed. Continue to take your medicine even if you feel better. Do not stop taking except on your doctor's advice. If you are taking the 23 mg donepezil tablet, swallow it whole; do not cut, crush, or chew it. Talk to your pediatrician regarding the use of this medicine in children. Special care may be needed. Overdosage: If you think you have taken too much  of this medicine contact a poison control center or emergency room at once. NOTE: This medicine is only for you. Do not share this medicine with others. What if I miss a dose? If you miss a dose, take it as soon as you can. If it is almost time for your next dose, take only that dose, do not take double or extra doses. What may interact with this medicine? Do not take this medicine with any of the following medications: -certain medicines for fungal infections like itraconazole, fluconazole, posaconazole, and voriconazole -cisapride -dextromethorphan; quinidine -dofetilide -dronedarone -pimozide -quinidine -thioridazine -ziprasidone This medicine may also interact with the following medications: -antihistamines for allergy, cough and cold -atropine -bethanechol -carbamazepine -certain medicines for bladder problems like oxybutynin, tolterodine -certain medicines for Parkinson's disease like benztropine, trihexyphenidyl -certain medicines for stomach problems like dicyclomine, hyoscyamine -certain medicines for travel sickness like scopolamine -dexamethasone -ipratropium -NSAIDs, medicines for pain and inflammation, like ibuprofen or naproxen -other medicines for Alzheimer's disease -other medicines that prolong the QT interval (cause an abnormal heart rhythm) -phenobarbital -phenytoin -rifampin, rifabutin or rifapentine This list may not describe all possible interactions. Give your health care provider a list of all the medicines, herbs, non-prescription drugs, or dietary supplements you use. Also tell them if you smoke, drink alcohol, or use illegal drugs. Some items may interact with your medicine. What should I watch for while using this medicine? Visit your doctor or health care professional for regular checks on your progress. Check with your doctor or health care professional if your symptoms do not get better or if they get worse. You  may get drowsy or dizzy. Do not drive,  use machinery, or do anything that needs mental alertness until you know how this drug affects you. What side effects may I notice from receiving this medicine? Side effects that you should report to your doctor or health care professional as soon as possible: -allergic reactions like skin rash, itching or hives, swelling of the face, lips, or tongue -feeling faint or lightheaded, falls -loss of bladder control -seizures -signs and symptoms of a dangerous change in heartbeat or heart rhythm like chest pain; dizziness; fast or irregular heartbeat; palpitations; feeling faint or lightheaded, falls; breathing problems -signs and symptoms of infection like fever or chills; cough; sore throat; pain or trouble passing urine -signs and symptoms of liver injury like dark yellow or brown urine; general ill feeling or flu-like symptoms; light-colored stools; loss of appetite; nausea; right upper belly pain; unusually weak or tired; yellowing of the eyes or skin -slow heartbeat or palpitations -unusual bleeding or bruising -vomiting Side effects that usually do not require medical attention (report to your doctor or health care professional if they continue or are bothersome): -diarrhea, especially when starting treatment -headache -loss of appetite -muscle cramps -nausea -stomach upset This list may not describe all possible side effects. Call your doctor for medical advice about side effects. You may report side effects to FDA at 1-800-FDA-1088. Where should I keep my medicine? Keep out of reach of children. Store at room temperature between 15 and 30 degrees C (59 and 86 degrees F). Throw away any unused medicine after the expiration date. NOTE: This sheet is a summary. It may not cover all possible information. If you have questions about this medicine, talk to your doctor, pharmacist, or health care provider.  2018 Elsevier/Gold Standard (2016-05-10 21:00:42)

## 2017-10-06 NOTE — Progress Notes (Signed)
I have read the note, and I agree with the clinical assessment and plan.  Christopher [name]   

## 2018-02-06 ENCOUNTER — Ambulatory Visit: Payer: Medicare Other | Admitting: Adult Health

## 2018-02-06 ENCOUNTER — Encounter: Payer: Self-pay | Admitting: Adult Health

## 2018-02-06 ENCOUNTER — Telehealth: Payer: Self-pay | Admitting: *Deleted

## 2018-02-06 NOTE — Telephone Encounter (Signed)
Patient was no show for FU with NP today. 

## 2018-09-06 ENCOUNTER — Other Ambulatory Visit: Payer: Self-pay | Admitting: Gastroenterology

## 2018-09-06 DIAGNOSIS — R194 Change in bowel habit: Secondary | ICD-10-CM

## 2018-09-08 ENCOUNTER — Other Ambulatory Visit: Payer: Self-pay | Admitting: Gastroenterology

## 2018-09-08 DIAGNOSIS — K5730 Diverticulosis of large intestine without perforation or abscess without bleeding: Secondary | ICD-10-CM

## 2018-09-08 DIAGNOSIS — K5900 Constipation, unspecified: Secondary | ICD-10-CM

## 2018-10-02 ENCOUNTER — Ambulatory Visit
Admission: RE | Admit: 2018-10-02 | Discharge: 2018-10-02 | Disposition: A | Discharge status: Home / Self Care | Payer: Medicare Other | Source: Ambulatory Visit | Attending: Gastroenterology | Admitting: Gastroenterology

## 2018-10-02 DIAGNOSIS — K5900 Constipation, unspecified: Secondary | ICD-10-CM

## 2018-10-02 DIAGNOSIS — K5730 Diverticulosis of large intestine without perforation or abscess without bleeding: Secondary | ICD-10-CM

## 2019-12-27 ENCOUNTER — Ambulatory Visit: Payer: Medicare Other | Attending: Internal Medicine

## 2019-12-27 DIAGNOSIS — Z23 Encounter for immunization: Secondary | ICD-10-CM | POA: Insufficient documentation

## 2019-12-27 NOTE — Progress Notes (Signed)
   Covid-19 Vaccination Clinic  Name:  Christopher [name]    MRN: [id number] DOB: May 19, 1946  12/27/2019  [name] was observed post Covid-19 immunization for 15 minutes without incidence. He was provided with Vaccine Information Sheet and instruction to access the V-Safe system.   [name] was instructed to call 911 with any severe reactions post vaccine: Marland Kitchen Difficulty breathing  . Swelling of your face and throat  . A fast heartbeat  . A bad rash all over your body  . Dizziness and weakness    Immunizations Administered    Name Date Dose VIS Date Route   Pfizer COVID-19 Vaccine 12/27/2019  6:32 PM 0.3 mL 11/16/2019 Intramuscular   Manufacturer: [place]   Lot: [id number]   [place]: [id number]

## 2020-01-17 ENCOUNTER — Ambulatory Visit: Payer: Medicare Other | Attending: Internal Medicine

## 2020-01-17 DIAGNOSIS — Z23 Encounter for immunization: Secondary | ICD-10-CM

## 2020-01-17 NOTE — Progress Notes (Signed)
   Covid-19 Vaccination Clinic  Name:  Christopher [name]    MRN: [id number] DOB: [birth year]  01/17/2020  Christopher [name] was observed post Covid-19 immunization for 15 minutes without incidence. He was provided with Vaccine Information Sheet and instruction to access the V-Safe system.   Christopher [name] was instructed to call 911 with any severe reactions post vaccine: Marland Kitchen Difficulty breathing  . Swelling of your face and throat  . A fast heartbeat  . A bad rash all over your body  . Dizziness and weakness    Immunizations Administered    Name Date Dose VIS Date Route   Pfizer COVID-19 Vaccine 01/17/2020 11:57 AM 0.3 mL 11/16/2019 Intramuscular   Manufacturer: [place]   Lot: [id number]   [place]: [id number]

## 2020-06-28 ENCOUNTER — Inpatient Hospital Stay (HOSPITAL_COMMUNITY)
Admission: EM | Admit: 2020-06-28 | Discharge: 2020-07-16 | DRG: 004 | Disposition: A | Discharge status: Other Acute Inpatient Hospital | Payer: Medicare Other | Attending: Pulmonary Disease | Admitting: Pulmonary Disease

## 2020-06-28 DIAGNOSIS — N179 Acute kidney failure, unspecified: Secondary | ICD-10-CM

## 2020-06-28 DIAGNOSIS — R4020 Unspecified coma: Secondary | ICD-10-CM | POA: Diagnosis not present

## 2020-06-28 DIAGNOSIS — R1310 Dysphagia, unspecified: Secondary | ICD-10-CM

## 2020-06-28 DIAGNOSIS — E785 Hyperlipidemia, unspecified: Secondary | ICD-10-CM | POA: Diagnosis present

## 2020-06-28 DIAGNOSIS — I480 Paroxysmal atrial fibrillation: Secondary | ICD-10-CM

## 2020-06-28 DIAGNOSIS — J690 Pneumonitis due to inhalation of food and vomit: Secondary | ICD-10-CM | POA: Diagnosis not present

## 2020-06-28 DIAGNOSIS — Z20822 Contact with and (suspected) exposure to covid-19: Secondary | ICD-10-CM | POA: Diagnosis present

## 2020-06-28 DIAGNOSIS — Y92230 Patient room in hospital as the place of occurrence of the external cause: Secondary | ICD-10-CM | POA: Diagnosis not present

## 2020-06-28 DIAGNOSIS — Z79899 Other long term (current) drug therapy: Secondary | ICD-10-CM

## 2020-06-28 DIAGNOSIS — Z7982 Long term (current) use of aspirin: Secondary | ICD-10-CM

## 2020-06-28 DIAGNOSIS — I4901 Ventricular fibrillation: Secondary | ICD-10-CM | POA: Diagnosis not present

## 2020-06-28 DIAGNOSIS — I130 Hypertensive heart and chronic kidney disease with heart failure and stage 1 through stage 4 chronic kidney disease, or unspecified chronic kidney disease: Secondary | ICD-10-CM | POA: Diagnosis present

## 2020-06-28 DIAGNOSIS — I462 Cardiac arrest due to underlying cardiac condition: Secondary | ICD-10-CM | POA: Diagnosis present

## 2020-06-28 DIAGNOSIS — I469 Cardiac arrest, cause unspecified: Secondary | ICD-10-CM | POA: Diagnosis not present

## 2020-06-28 DIAGNOSIS — F17210 Nicotine dependence, cigarettes, uncomplicated: Secondary | ICD-10-CM | POA: Diagnosis present

## 2020-06-28 DIAGNOSIS — Z515 Encounter for palliative care: Secondary | ICD-10-CM

## 2020-06-28 DIAGNOSIS — Z8249 Family history of ischemic heart disease and other diseases of the circulatory system: Secondary | ICD-10-CM

## 2020-06-28 DIAGNOSIS — Z820 Family history of epilepsy and other diseases of the nervous system: Secondary | ICD-10-CM

## 2020-06-28 DIAGNOSIS — J189 Pneumonia, unspecified organism: Secondary | ICD-10-CM

## 2020-06-28 DIAGNOSIS — J9600 Acute respiratory failure, unspecified whether with hypoxia or hypercapnia: Secondary | ICD-10-CM

## 2020-06-28 DIAGNOSIS — G931 Anoxic brain damage, not elsewhere classified: Secondary | ICD-10-CM

## 2020-06-28 DIAGNOSIS — I255 Ischemic cardiomyopathy: Secondary | ICD-10-CM | POA: Diagnosis present

## 2020-06-28 DIAGNOSIS — E872 Acidosis: Secondary | ICD-10-CM | POA: Diagnosis present

## 2020-06-28 DIAGNOSIS — J80 Acute respiratory distress syndrome: Secondary | ICD-10-CM

## 2020-06-28 DIAGNOSIS — N170 Acute kidney failure with tubular necrosis: Secondary | ICD-10-CM | POA: Diagnosis present

## 2020-06-28 DIAGNOSIS — E7220 Disorder of urea cycle metabolism, unspecified: Secondary | ICD-10-CM | POA: Diagnosis not present

## 2020-06-28 DIAGNOSIS — Z4659 Encounter for fitting and adjustment of other gastrointestinal appliance and device: Secondary | ICD-10-CM

## 2020-06-28 DIAGNOSIS — G9341 Metabolic encephalopathy: Secondary | ICD-10-CM | POA: Diagnosis present

## 2020-06-28 DIAGNOSIS — Z880 Allergy status to penicillin: Secondary | ICD-10-CM

## 2020-06-28 DIAGNOSIS — G43909 Migraine, unspecified, not intractable, without status migrainosus: Secondary | ICD-10-CM | POA: Diagnosis present

## 2020-06-28 DIAGNOSIS — I214 Non-ST elevation (NSTEMI) myocardial infarction: Secondary | ICD-10-CM

## 2020-06-28 DIAGNOSIS — J9601 Acute respiratory failure with hypoxia: Secondary | ICD-10-CM

## 2020-06-28 DIAGNOSIS — Z7189 Other specified counseling: Secondary | ICD-10-CM

## 2020-06-28 DIAGNOSIS — N1831 Chronic kidney disease, stage 3a: Secondary | ICD-10-CM | POA: Diagnosis present

## 2020-06-28 DIAGNOSIS — I5043 Acute on chronic combined systolic (congestive) and diastolic (congestive) heart failure: Secondary | ICD-10-CM | POA: Diagnosis not present

## 2020-06-28 DIAGNOSIS — J440 Chronic obstructive pulmonary disease with acute lower respiratory infection: Secondary | ICD-10-CM | POA: Diagnosis not present

## 2020-06-28 DIAGNOSIS — J9811 Atelectasis: Secondary | ICD-10-CM

## 2020-06-28 DIAGNOSIS — Z930 Tracheostomy status: Secondary | ICD-10-CM

## 2020-06-28 DIAGNOSIS — Z7951 Long term (current) use of inhaled steroids: Secondary | ICD-10-CM

## 2020-06-28 DIAGNOSIS — Y288XXA Contact with other sharp object, undetermined intent, initial encounter: Secondary | ICD-10-CM | POA: Diagnosis not present

## 2020-06-28 DIAGNOSIS — J150 Pneumonia due to Klebsiella pneumoniae: Secondary | ICD-10-CM | POA: Diagnosis not present

## 2020-06-28 DIAGNOSIS — I452 Bifascicular block: Secondary | ICD-10-CM | POA: Diagnosis present

## 2020-06-28 DIAGNOSIS — E1122 Type 2 diabetes mellitus with diabetic chronic kidney disease: Secondary | ICD-10-CM | POA: Diagnosis present

## 2020-06-28 DIAGNOSIS — E876 Hypokalemia: Secondary | ICD-10-CM | POA: Diagnosis present

## 2020-06-29 ENCOUNTER — Inpatient Hospital Stay (HOSPITAL_COMMUNITY): Payer: Medicare Other

## 2020-06-29 ENCOUNTER — Emergency Department (HOSPITAL_COMMUNITY): Payer: Medicare Other

## 2020-06-29 ENCOUNTER — Other Ambulatory Visit: Payer: Self-pay

## 2020-06-29 ENCOUNTER — Encounter (HOSPITAL_COMMUNITY): Payer: Self-pay | Admitting: Emergency Medicine

## 2020-06-29 DIAGNOSIS — Z20822 Contact with and (suspected) exposure to covid-19: Secondary | ICD-10-CM | POA: Diagnosis present

## 2020-06-29 DIAGNOSIS — I480 Paroxysmal atrial fibrillation: Secondary | ICD-10-CM | POA: Diagnosis not present

## 2020-06-29 DIAGNOSIS — Z515 Encounter for palliative care: Secondary | ICD-10-CM | POA: Diagnosis not present

## 2020-06-29 DIAGNOSIS — J9601 Acute respiratory failure with hypoxia: Secondary | ICD-10-CM

## 2020-06-29 DIAGNOSIS — G931 Anoxic brain damage, not elsewhere classified: Secondary | ICD-10-CM | POA: Diagnosis not present

## 2020-06-29 DIAGNOSIS — J150 Pneumonia due to Klebsiella pneumoniae: Secondary | ICD-10-CM | POA: Diagnosis not present

## 2020-06-29 DIAGNOSIS — Y288XXA Contact with other sharp object, undetermined intent, initial encounter: Secondary | ICD-10-CM | POA: Diagnosis not present

## 2020-06-29 DIAGNOSIS — Y92230 Patient room in hospital as the place of occurrence of the external cause: Secondary | ICD-10-CM | POA: Diagnosis not present

## 2020-06-29 DIAGNOSIS — I255 Ischemic cardiomyopathy: Secondary | ICD-10-CM | POA: Diagnosis present

## 2020-06-29 DIAGNOSIS — J690 Pneumonitis due to inhalation of food and vomit: Secondary | ICD-10-CM | POA: Diagnosis not present

## 2020-06-29 DIAGNOSIS — F17210 Nicotine dependence, cigarettes, uncomplicated: Secondary | ICD-10-CM | POA: Diagnosis present

## 2020-06-29 DIAGNOSIS — N179 Acute kidney failure, unspecified: Secondary | ICD-10-CM

## 2020-06-29 DIAGNOSIS — E872 Acidosis: Secondary | ICD-10-CM | POA: Diagnosis present

## 2020-06-29 DIAGNOSIS — I469 Cardiac arrest, cause unspecified: Secondary | ICD-10-CM | POA: Diagnosis not present

## 2020-06-29 DIAGNOSIS — I462 Cardiac arrest due to underlying cardiac condition: Secondary | ICD-10-CM | POA: Diagnosis present

## 2020-06-29 DIAGNOSIS — N1831 Chronic kidney disease, stage 3a: Secondary | ICD-10-CM | POA: Diagnosis present

## 2020-06-29 DIAGNOSIS — R569 Unspecified convulsions: Secondary | ICD-10-CM | POA: Diagnosis not present

## 2020-06-29 DIAGNOSIS — Z930 Tracheostomy status: Secondary | ICD-10-CM | POA: Diagnosis not present

## 2020-06-29 DIAGNOSIS — R4020 Unspecified coma: Secondary | ICD-10-CM | POA: Diagnosis not present

## 2020-06-29 DIAGNOSIS — E1122 Type 2 diabetes mellitus with diabetic chronic kidney disease: Secondary | ICD-10-CM | POA: Diagnosis present

## 2020-06-29 DIAGNOSIS — I4901 Ventricular fibrillation: Secondary | ICD-10-CM | POA: Diagnosis not present

## 2020-06-29 DIAGNOSIS — J9600 Acute respiratory failure, unspecified whether with hypoxia or hypercapnia: Secondary | ICD-10-CM | POA: Diagnosis not present

## 2020-06-29 DIAGNOSIS — I214 Non-ST elevation (NSTEMI) myocardial infarction: Secondary | ICD-10-CM

## 2020-06-29 DIAGNOSIS — G43909 Migraine, unspecified, not intractable, without status migrainosus: Secondary | ICD-10-CM | POA: Diagnosis present

## 2020-06-29 DIAGNOSIS — J9621 Acute and chronic respiratory failure with hypoxia: Secondary | ICD-10-CM | POA: Diagnosis not present

## 2020-06-29 DIAGNOSIS — G9341 Metabolic encephalopathy: Secondary | ICD-10-CM | POA: Diagnosis present

## 2020-06-29 DIAGNOSIS — I5043 Acute on chronic combined systolic (congestive) and diastolic (congestive) heart failure: Secondary | ICD-10-CM | POA: Diagnosis not present

## 2020-06-29 DIAGNOSIS — J440 Chronic obstructive pulmonary disease with acute lower respiratory infection: Secondary | ICD-10-CM | POA: Diagnosis not present

## 2020-06-29 DIAGNOSIS — J9602 Acute respiratory failure with hypercapnia: Secondary | ICD-10-CM | POA: Diagnosis not present

## 2020-06-29 DIAGNOSIS — I130 Hypertensive heart and chronic kidney disease with heart failure and stage 1 through stage 4 chronic kidney disease, or unspecified chronic kidney disease: Secondary | ICD-10-CM | POA: Diagnosis present

## 2020-06-29 DIAGNOSIS — R1310 Dysphagia, unspecified: Secondary | ICD-10-CM | POA: Diagnosis not present

## 2020-06-29 DIAGNOSIS — N170 Acute kidney failure with tubular necrosis: Secondary | ICD-10-CM | POA: Diagnosis present

## 2020-06-29 DIAGNOSIS — E7220 Disorder of urea cycle metabolism, unspecified: Secondary | ICD-10-CM | POA: Diagnosis not present

## 2020-06-29 DIAGNOSIS — I471 Supraventricular tachycardia: Secondary | ICD-10-CM | POA: Diagnosis not present

## 2020-06-29 DIAGNOSIS — I452 Bifascicular block: Secondary | ICD-10-CM | POA: Diagnosis present

## 2020-06-29 DIAGNOSIS — I482 Chronic atrial fibrillation, unspecified: Secondary | ICD-10-CM | POA: Diagnosis not present

## 2020-06-29 LAB — I-STAT ARTERIAL BLOOD GAS, ED
Acid-base deficit: 2 mmol/L (ref 0.0–2.0)
Acid-base deficit: 7 mmol/L — ABNORMAL HIGH (ref 0.0–2.0)
Bicarbonate: 20.1 mmol/L (ref 20.0–28.0)
Bicarbonate: 26.8 mmol/L (ref 20.0–28.0)
Calcium, Ion: 1.09 mmol/L — ABNORMAL LOW (ref 1.15–1.40)
Calcium, Ion: 1.2 mmol/L (ref 1.15–1.40)
HCT: 48 % (ref 39.0–52.0)
HCT: 51 % (ref 39.0–52.0)
Hemoglobin: 16.3 g/dL (ref 13.0–17.0)
Hemoglobin: 17.3 g/dL — ABNORMAL HIGH (ref 13.0–17.0)
O2 Saturation: 100 %
O2 Saturation: 89 %
Patient temperature: 96.1
Patient temperature: 96.9
Potassium: 3.1 mmol/L — ABNORMAL LOW (ref 3.5–5.1)
Potassium: 3.1 mmol/L — ABNORMAL LOW (ref 3.5–5.1)
Sodium: 145 mmol/L (ref 135–145)
Sodium: 146 mmol/L — ABNORMAL HIGH (ref 135–145)
TCO2: 21 mmol/L — ABNORMAL LOW (ref 22–32)
TCO2: 29 mmol/L (ref 22–32)
pCO2 arterial: 41.5 mmHg (ref 32.0–48.0)
pCO2 arterial: 60.1 mmHg — ABNORMAL HIGH (ref 32.0–48.0)
pH, Arterial: 7.253 — ABNORMAL LOW (ref 7.350–7.450)
pH, Arterial: 7.286 — ABNORMAL LOW (ref 7.350–7.450)
pO2, Arterial: 411 mmHg — ABNORMAL HIGH (ref 83.0–108.0)
pO2, Arterial: 59 mmHg — ABNORMAL LOW (ref 83.0–108.0)

## 2020-06-29 LAB — GLUCOSE, CAPILLARY
Glucose-Capillary: 252 mg/dL — ABNORMAL HIGH (ref 70–99)
Glucose-Capillary: 257 mg/dL — ABNORMAL HIGH (ref 70–99)
Glucose-Capillary: 265 mg/dL — ABNORMAL HIGH (ref 70–99)
Glucose-Capillary: 116 mg/dL — ABNORMAL HIGH (ref 70–99)
Glucose-Capillary: 115 mg/dL — ABNORMAL HIGH (ref 70–99)
Glucose-Capillary: 135 mg/dL — ABNORMAL HIGH (ref 70–99)
Glucose-Capillary: 124 mg/dL — ABNORMAL HIGH (ref 70–99)

## 2020-06-29 LAB — COMPREHENSIVE METABOLIC PANEL
ALT: 101 U/L — ABNORMAL HIGH (ref 0–44)
AST: 102 U/L — ABNORMAL HIGH (ref 15–41)
Albumin: 3.1 g/dL — ABNORMAL LOW (ref 3.5–5.0)
Alkaline Phosphatase: 110 U/L (ref 38–126)
Anion gap: 12 (ref 5–15)
BUN: 19 mg/dL (ref 8–23)
CO2: 22 mmol/L (ref 22–32)
Calcium: 8.3 mg/dL — ABNORMAL LOW (ref 8.9–10.3)
Chloride: 108 mmol/L (ref 98–111)
Creatinine, Ser: 1.96 mg/dL — ABNORMAL HIGH (ref 0.61–1.24)
GFR calc Af Amer: 38 mL/min — ABNORMAL LOW (ref >60)
GFR calc non Af Amer: 33 mL/min — ABNORMAL LOW (ref >60)
Glucose, Bld: 221 mg/dL — ABNORMAL HIGH (ref 70–99)
Potassium: 3.2 mmol/L — ABNORMAL LOW (ref 3.5–5.1)
Sodium: 142 mmol/L (ref 135–145)
Total Bilirubin: 0.7 mg/dL (ref 0.3–1.2)
Total Protein: 6.1 g/dL — ABNORMAL LOW (ref 6.5–8.1)

## 2020-06-29 LAB — TROPONIN I (HIGH SENSITIVITY)
Troponin I (High Sensitivity): 344 ng/L (ref <18)
Troponin I (High Sensitivity): 1322 ng/L (ref <18)

## 2020-06-29 LAB — CBC WITH DIFFERENTIAL/PLATELET
Abs Immature Granulocytes: 1.01 10*3/uL — ABNORMAL HIGH (ref 0.00–0.07)
Basophils Absolute: 0.1 10*3/uL (ref 0.0–0.1)
Basophils Relative: 1 %
Eosinophils Absolute: 0.5 10*3/uL (ref 0.0–0.5)
Eosinophils Relative: 3 %
HCT: 49.6 % (ref 39.0–52.0)
Hemoglobin: 15.4 g/dL (ref 13.0–17.0)
Immature Granulocytes: 6 %
Lymphocytes Relative: 27 %
Lymphs Abs: 4.3 10*3/uL — ABNORMAL HIGH (ref 0.7–4.0)
MCH: 28.9 pg (ref 26.0–34.0)
MCHC: 31 g/dL (ref 30.0–36.0)
MCV: 93.1 fL (ref 80.0–100.0)
Monocytes Absolute: 0.5 10*3/uL (ref 0.1–1.0)
Monocytes Relative: 3 %
Neutro Abs: 9.4 10*3/uL — ABNORMAL HIGH (ref 1.7–7.7)
Neutrophils Relative %: 60 %
Platelets: 236 10*3/uL (ref 150–400)
RBC: 5.33 MIL/uL (ref 4.22–5.81)
RDW: 13.2 % (ref 11.5–15.5)
WBC: 15.8 10*3/uL — ABNORMAL HIGH (ref 4.0–10.5)
nRBC: 0 % (ref 0.0–0.2)

## 2020-06-29 LAB — HEPARIN LEVEL (UNFRACTIONATED)
Heparin Unfractionated: 0.15 IU/mL — ABNORMAL LOW (ref 0.30–0.70)
Heparin Unfractionated: 0.35 IU/mL (ref 0.30–0.70)

## 2020-06-29 LAB — CBC
HCT: 50.3 % (ref 39.0–52.0)
Hemoglobin: 15.9 g/dL (ref 13.0–17.0)
MCH: 29.2 pg (ref 26.0–34.0)
MCHC: 31.6 g/dL (ref 30.0–36.0)
MCV: 92.5 fL (ref 80.0–100.0)
Platelets: 249 10*3/uL (ref 150–400)
RBC: 5.44 MIL/uL (ref 4.22–5.81)
RDW: 13.5 % (ref 11.5–15.5)
WBC: 18.2 10*3/uL — ABNORMAL HIGH (ref 4.0–10.5)
nRBC: 0 % (ref 0.0–0.2)

## 2020-06-29 LAB — LACTIC ACID, PLASMA
Lactic Acid, Venous: 5.6 mmol/L (ref 0.5–1.9)
Lactic Acid, Venous: 4.5 mmol/L (ref 0.5–1.9)

## 2020-06-29 LAB — BASIC METABOLIC PANEL
Anion gap: 12 (ref 5–15)
BUN: 24 mg/dL — ABNORMAL HIGH (ref 8–23)
CO2: 18 mmol/L — ABNORMAL LOW (ref 22–32)
Calcium: 7.5 mg/dL — ABNORMAL LOW (ref 8.9–10.3)
Chloride: 108 mmol/L (ref 98–111)
Creatinine, Ser: 1.87 mg/dL — ABNORMAL HIGH (ref 0.61–1.24)
GFR calc Af Amer: 40 mL/min — ABNORMAL LOW (ref >60)
GFR calc non Af Amer: 35 mL/min — ABNORMAL LOW (ref >60)
Glucose, Bld: 292 mg/dL — ABNORMAL HIGH (ref 70–99)
Potassium: 3.9 mmol/L (ref 3.5–5.1)
Sodium: 138 mmol/L (ref 135–145)

## 2020-06-29 LAB — POCT I-STAT 7, (LYTES, BLD GAS, ICA,H+H)
Acid-base deficit: 6 mmol/L — ABNORMAL HIGH (ref 0.0–2.0)
Bicarbonate: 21.1 mmol/L (ref 20.0–28.0)
Calcium, Ion: 1.08 mmol/L — ABNORMAL LOW (ref 1.15–1.40)
HCT: 49 % (ref 39.0–52.0)
Hemoglobin: 16.7 g/dL (ref 13.0–17.0)
O2 Saturation: 100 %
Patient temperature: 35.7
Potassium: 3.9 mmol/L (ref 3.5–5.1)
Sodium: 142 mmol/L (ref 135–145)
TCO2: 22 mmol/L (ref 22–32)
pCO2 arterial: 42.3 mmHg (ref 32.0–48.0)
pH, Arterial: 7.298 — ABNORMAL LOW (ref 7.350–7.450)
pO2, Arterial: 250 mmHg — ABNORMAL HIGH (ref 83.0–108.0)

## 2020-06-29 LAB — RAPID URINE DRUG SCREEN, HOSP PERFORMED
Amphetamines: NOT DETECTED
Barbiturates: NOT DETECTED
Benzodiazepines: NOT DETECTED
Cocaine: NOT DETECTED
Opiates: NOT DETECTED
Tetrahydrocannabinol: NOT DETECTED

## 2020-06-29 LAB — SARS CORONAVIRUS 2 BY RT PCR (HOSPITAL ORDER, PERFORMED IN ~~LOC~~ HOSPITAL LAB): SARS Coronavirus 2: NEGATIVE

## 2020-06-29 LAB — PHOSPHORUS
Phosphorus: 4.9 mg/dL — ABNORMAL HIGH (ref 2.5–4.6)
Phosphorus: 3.4 mg/dL (ref 2.5–4.6)
Phosphorus: 4.8 mg/dL — ABNORMAL HIGH (ref 2.5–4.6)

## 2020-06-29 LAB — MAGNESIUM
Magnesium: 1.9 mg/dL (ref 1.7–2.4)
Magnesium: 2.2 mg/dL (ref 1.7–2.4)
Magnesium: 2.9 mg/dL — ABNORMAL HIGH (ref 1.7–2.4)
Magnesium: 2.5 mg/dL — ABNORMAL HIGH (ref 1.7–2.4)

## 2020-06-29 LAB — BRAIN NATRIURETIC PEPTIDE: B Natriuretic Peptide: 412 pg/mL — ABNORMAL HIGH (ref 0.0–100.0)

## 2020-06-29 LAB — HEMOGLOBIN A1C
Hgb A1c MFr Bld: 6.1 % — ABNORMAL HIGH (ref 4.8–5.6)
Mean Plasma Glucose: 128.37 mg/dL

## 2020-06-29 LAB — CBG MONITORING, ED: Glucose-Capillary: 215 mg/dL — ABNORMAL HIGH (ref 70–99)

## 2020-06-29 LAB — MRSA PCR SCREENING: MRSA by PCR: NEGATIVE

## 2020-06-29 MED ORDER — SODIUM CHLORIDE 0.9 % IV SOLN
INTRAVENOUS | Status: AC | PRN
  Administered 2020-06-29: 1000 mL via INTRAVENOUS

## 2020-06-29 MED ORDER — CHLORHEXIDINE GLUCONATE CLOTH 2 % EX PADS
6.0000 | MEDICATED_PAD | Freq: Every day | CUTANEOUS | Status: DC
  Administered 2020-06-30 – 2020-07-16 (×16): 6 via TOPICAL

## 2020-06-29 MED ORDER — LABETALOL HCL 5 MG/ML IV SOLN
5.0000 mg | INTRAVENOUS | Status: DC | PRN
  Administered 2020-06-29 – 2020-07-01 (×4): 5 mg via INTRAVENOUS
  Filled 2020-06-29 (×4): qty 4

## 2020-06-29 MED ORDER — POLYETHYLENE GLYCOL 3350 17 G PO PACK
17.0000 g | PACK | Freq: Every day | ORAL | Status: DC
  Administered 2020-06-29: 17 g via ORAL
  Filled 2020-06-29: qty 1

## 2020-06-29 MED ORDER — NOREPINEPHRINE 4 MG/250ML-% IV SOLN
0.0000 ug/min | INTRAVENOUS | Status: DC
  Administered 2020-06-29: 2 ug/min via INTRAVENOUS
  Administered 2020-06-29: 10 ug/min via INTRAVENOUS
  Filled 2020-06-29: qty 250

## 2020-06-29 MED ORDER — IPRATROPIUM-ALBUTEROL 0.5-2.5 (3) MG/3ML IN SOLN
3.0000 mL | Freq: Four times a day (QID) | RESPIRATORY_TRACT | Status: DC
  Administered 2020-06-29 – 2020-07-01 (×9): 3 mL via RESPIRATORY_TRACT
  Filled 2020-06-29 (×8): qty 3

## 2020-06-29 MED ORDER — ETOMIDATE 2 MG/ML IV SOLN
INTRAVENOUS | Status: AC | PRN
  Administered 2020-06-29: 20 mg via INTRAVENOUS

## 2020-06-29 MED ORDER — HEPARIN (PORCINE) 25000 UT/250ML-% IV SOLN
1600.0000 [IU]/h | INTRAVENOUS | Status: AC
  Administered 2020-06-29: 1000 [IU]/h via INTRAVENOUS
  Administered 2020-06-30: 1200 [IU]/h via INTRAVENOUS
  Administered 2020-07-01: 1500 [IU]/h via INTRAVENOUS
  Administered 2020-07-03: 2100 [IU]/h via INTRAVENOUS
  Administered 2020-07-03: 1500 [IU]/h via INTRAVENOUS
  Administered 2020-07-04: 2250 [IU]/h via INTRAVENOUS
  Administered 2020-07-04: 2100 [IU]/h via INTRAVENOUS
  Administered 2020-07-05 (×2): 2250 [IU]/h via INTRAVENOUS
  Administered 2020-07-06: 2100 [IU]/h via INTRAVENOUS
  Administered 2020-07-07 – 2020-07-09 (×5): 1600 [IU]/h via INTRAVENOUS
  Filled 2020-06-29 (×19): qty 250

## 2020-06-29 MED ORDER — DOCUSATE SODIUM 100 MG PO CAPS: 100.0000 mg | ORAL_CAPSULE | Freq: Two times a day (BID) | ORAL | Status: DC | PRN

## 2020-06-29 MED ORDER — FENTANYL 2500MCG IN NS 250ML (10MCG/ML) PREMIX INFUSION
0.0000 ug/h | INTRAVENOUS | Status: DC
  Administered 2020-06-29 – 2020-06-30 (×2): 50 ug/h via INTRAVENOUS
  Filled 2020-06-29 (×2): qty 250

## 2020-06-29 MED ORDER — PROPOFOL 1000 MG/100ML IV EMUL
INTRAVENOUS | Status: AC
  Administered 2020-06-29: 10 ug/kg/min via INTRAVENOUS
  Filled 2020-06-29: qty 100

## 2020-06-29 MED ORDER — ORAL CARE MOUTH RINSE
15.0000 mL | OROMUCOSAL | Status: DC
  Administered 2020-06-29 – 2020-07-16 (×167): 15 mL via OROMUCOSAL

## 2020-06-29 MED ORDER — SODIUM CHLORIDE 0.9 % IV BOLUS
500.0000 mL | Freq: Once | INTRAVENOUS | Status: AC
  Administered 2020-06-29: 500 mL via INTRAVENOUS

## 2020-06-29 MED ORDER — AMIODARONE HCL IN DEXTROSE 360-4.14 MG/200ML-% IV SOLN
30.0000 mg/h | INTRAVENOUS | Status: DC
  Administered 2020-06-29: 30 mg/h via INTRAVENOUS
  Filled 2020-06-29: qty 200

## 2020-06-29 MED ORDER — ACETAMINOPHEN 325 MG PO TABS: 650.0000 mg | ORAL_TABLET | Freq: Four times a day (QID) | ORAL | Status: DC | PRN

## 2020-06-29 MED ORDER — FUROSEMIDE 10 MG/ML IJ SOLN
80.0000 mg | Freq: Once | INTRAMUSCULAR | Status: AC
  Administered 2020-06-29: 80 mg via INTRAVENOUS
  Filled 2020-06-29: qty 8

## 2020-06-29 MED ORDER — FUROSEMIDE 10 MG/ML IJ SOLN
40.0000 mg | Freq: Once | INTRAMUSCULAR | Status: AC
  Administered 2020-06-29: 40 mg via INTRAVENOUS
  Filled 2020-06-29: qty 4

## 2020-06-29 MED ORDER — POLYETHYLENE GLYCOL 3350 17 G PO PACK: 17.0000 g | PACK | Freq: Every day | ORAL | Status: DC | PRN

## 2020-06-29 MED ORDER — HEPARIN BOLUS VIA INFUSION
2000.0000 [IU] | Freq: Once | INTRAVENOUS | Status: AC
  Administered 2020-06-29: 2000 [IU] via INTRAVENOUS
  Filled 2020-06-29: qty 2000

## 2020-06-29 MED ORDER — FENTANYL CITRATE (PF) 100 MCG/2ML IJ SOLN
INTRAMUSCULAR | Status: AC
  Administered 2020-06-29: 25 ug
  Filled 2020-06-29: qty 2

## 2020-06-29 MED ORDER — PANTOPRAZOLE SODIUM 40 MG IV SOLR
40.0000 mg | Freq: Every day | INTRAVENOUS | Status: DC
  Administered 2020-06-29 – 2020-06-30 (×2): 40 mg via INTRAVENOUS
  Filled 2020-06-29 (×2): qty 40

## 2020-06-29 MED ORDER — PHENYLEPHRINE HCL-NACL 10-0.9 MG/250ML-% IV SOLN
25.0000 ug/min | INTRAVENOUS | Status: DC
  Administered 2020-06-29 – 2020-06-30 (×2): 25 ug/min via INTRAVENOUS
  Filled 2020-06-29: qty 250

## 2020-06-29 MED ORDER — SODIUM CHLORIDE 0.9 % IV SOLN
250.0000 mL | INTRAVENOUS | Status: DC
  Administered 2020-06-29 – 2020-07-09 (×2): 250 mL via INTRAVENOUS

## 2020-06-29 MED ORDER — ACETAMINOPHEN 325 MG PO TABS
650.0000 mg | ORAL_TABLET | ORAL | Status: DC
  Administered 2020-06-29: 650 mg via ORAL
  Filled 2020-06-29: qty 2

## 2020-06-29 MED ORDER — PROPOFOL 1000 MG/100ML IV EMUL
5.0000 ug/kg/min | INTRAVENOUS | Status: DC
  Administered 2020-06-29: 11 ug/kg/min via INTRAVENOUS
  Filled 2020-06-29 (×2): qty 100

## 2020-06-29 MED ORDER — SODIUM CHLORIDE 0.9 % IV SOLN
INTRAVENOUS | Status: DC | PRN
  Administered 2020-06-29: 800 mL via INTRA_ARTERIAL

## 2020-06-29 MED ORDER — PROSOURCE TF PO LIQD
45.0000 mL | Freq: Two times a day (BID) | ORAL | Status: DC
  Administered 2020-06-29 – 2020-06-30 (×3): 45 mL
  Filled 2020-06-29 (×3): qty 45

## 2020-06-29 MED ORDER — ASPIRIN 300 MG RE SUPP
300.0000 mg | Freq: Once | RECTAL | Status: AC
  Administered 2020-06-29: 300 mg via RECTAL
  Filled 2020-06-29: qty 1

## 2020-06-29 MED ORDER — ARTIFICIAL TEARS OPHTHALMIC OINT
TOPICAL_OINTMENT | OPHTHALMIC | Status: DC | PRN
  Administered 2020-07-02: 1 via OPHTHALMIC
  Filled 2020-06-29: qty 3.5

## 2020-06-29 MED ORDER — INSULIN ASPART 100 UNIT/ML ~~LOC~~ SOLN
0.0000 [IU] | SUBCUTANEOUS | Status: DC
  Administered 2020-06-29: 8 [IU] via SUBCUTANEOUS
  Administered 2020-06-29: 2 [IU] via SUBCUTANEOUS
  Administered 2020-06-29: 5 [IU] via SUBCUTANEOUS
  Administered 2020-06-29 – 2020-07-03 (×14): 2 [IU] via SUBCUTANEOUS
  Administered 2020-07-03 (×2): 3 [IU] via SUBCUTANEOUS
  Administered 2020-07-04: 2 [IU] via SUBCUTANEOUS
  Administered 2020-07-04 (×3): 3 [IU] via SUBCUTANEOUS
  Administered 2020-07-04 – 2020-07-05 (×4): 2 [IU] via SUBCUTANEOUS
  Administered 2020-07-05: 3 [IU] via SUBCUTANEOUS
  Administered 2020-07-05 – 2020-07-06 (×4): 2 [IU] via SUBCUTANEOUS
  Administered 2020-07-06: 3 [IU] via SUBCUTANEOUS
  Administered 2020-07-06 (×2): 2 [IU] via SUBCUTANEOUS
  Administered 2020-07-06: 3 [IU] via SUBCUTANEOUS
  Administered 2020-07-06 – 2020-07-08 (×10): 2 [IU] via SUBCUTANEOUS
  Administered 2020-07-08: 3 [IU] via SUBCUTANEOUS
  Administered 2020-07-08 – 2020-07-09 (×7): 2 [IU] via SUBCUTANEOUS
  Administered 2020-07-10: 3 [IU] via SUBCUTANEOUS
  Administered 2020-07-10 (×3): 2 [IU] via SUBCUTANEOUS
  Administered 2020-07-10: 3 [IU] via SUBCUTANEOUS
  Administered 2020-07-11 (×4): 2 [IU] via SUBCUTANEOUS
  Administered 2020-07-12 (×3): 3 [IU] via SUBCUTANEOUS
  Administered 2020-07-12: 2 [IU] via SUBCUTANEOUS
  Administered 2020-07-12 – 2020-07-13 (×4): 3 [IU] via SUBCUTANEOUS
  Administered 2020-07-13 (×2): 2 [IU] via SUBCUTANEOUS
  Administered 2020-07-13 (×2): 3 [IU] via SUBCUTANEOUS
  Administered 2020-07-14 (×2): 2 [IU] via SUBCUTANEOUS
  Administered 2020-07-14: 3 [IU] via SUBCUTANEOUS
  Administered 2020-07-14 (×2): 2 [IU] via SUBCUTANEOUS
  Administered 2020-07-15: 3 [IU] via SUBCUTANEOUS
  Administered 2020-07-15 (×2): 2 [IU] via SUBCUTANEOUS
  Administered 2020-07-15: 3 [IU] via SUBCUTANEOUS
  Administered 2020-07-15: 2 [IU] via SUBCUTANEOUS
  Administered 2020-07-16: 3 [IU] via SUBCUTANEOUS
  Administered 2020-07-16 (×3): 2 [IU] via SUBCUTANEOUS

## 2020-06-29 MED ORDER — CHLORHEXIDINE GLUCONATE 0.12% ORAL RINSE (MEDLINE KIT)
15.0000 mL | Freq: Two times a day (BID) | OROMUCOSAL | Status: DC
  Administered 2020-06-29 – 2020-07-16 (×33): 15 mL via OROMUCOSAL

## 2020-06-29 MED ORDER — ACETAMINOPHEN 650 MG RE SUPP
650.0000 mg | RECTAL | Status: DC
  Administered 2020-06-29: 650 mg via RECTAL
  Filled 2020-06-29 (×3): qty 1

## 2020-06-29 MED ORDER — ROCURONIUM BROMIDE 50 MG/5ML IV SOLN
INTRAVENOUS | Status: AC | PRN
  Administered 2020-06-29: 100 mg via INTRAVENOUS

## 2020-06-29 MED ORDER — CHLORHEXIDINE GLUCONATE CLOTH 2 % EX PADS
6.0000 | MEDICATED_PAD | Freq: Every day | CUTANEOUS | Status: DC
  Administered 2020-06-30: 6 via TOPICAL

## 2020-06-29 MED ORDER — DOCUSATE SODIUM 50 MG/5ML PO LIQD
100.0000 mg | Freq: Two times a day (BID) | ORAL | Status: DC
  Administered 2020-06-29 (×2): 100 mg via ORAL
  Filled 2020-06-29 (×2): qty 10

## 2020-06-29 MED ORDER — FENTANYL CITRATE (PF) 100 MCG/2ML IJ SOLN: 25.0000 ug | INTRAMUSCULAR | Status: DC | PRN

## 2020-06-29 MED ORDER — ACETAMINOPHEN 160 MG/5ML PO SOLN
650.0000 mg | ORAL | Status: DC
  Administered 2020-06-29 – 2020-07-16 (×98): 650 mg
  Filled 2020-06-29 (×97): qty 20.3

## 2020-06-29 MED ORDER — AMIODARONE HCL IN DEXTROSE 360-4.14 MG/200ML-% IV SOLN
60.0000 mg/h | INTRAVENOUS | Status: AC
  Administered 2020-06-29: 60 mg/h via INTRAVENOUS
  Filled 2020-06-29: qty 200

## 2020-06-29 MED ORDER — VITAL HIGH PROTEIN PO LIQD
1000.0000 mL | ORAL | Status: DC
  Administered 2020-06-29 – 2020-06-30 (×2): 1000 mL

## 2020-06-29 MED ORDER — FENTANYL CITRATE (PF) 100 MCG/2ML IJ SOLN
25.0000 ug | INTRAMUSCULAR | Status: DC | PRN
  Administered 2020-06-29 – 2020-06-30 (×9): 50 ug via INTRAVENOUS
  Filled 2020-06-29: qty 2

## 2020-06-29 MED ORDER — POTASSIUM CHLORIDE 10 MEQ/100ML IV SOLN
10.0000 meq | INTRAVENOUS | Status: AC
  Administered 2020-06-29 (×4): 10 meq via INTRAVENOUS
  Filled 2020-06-29 (×4): qty 100

## 2020-06-29 MED ORDER — INSULIN ASPART 100 UNIT/ML ~~LOC~~ SOLN
2.0000 [IU] | SUBCUTANEOUS | Status: DC
  Administered 2020-06-29: 2 [IU] via SUBCUTANEOUS

## 2020-06-29 MED ORDER — MAGNESIUM SULFATE 4 GM/100ML IV SOLN
4.0000 g | Freq: Once | INTRAVENOUS | Status: AC
  Administered 2020-06-29: 4 g via INTRAVENOUS
  Filled 2020-06-29: qty 100

## 2020-06-29 MED ORDER — SODIUM CHLORIDE 0.9 % IV BOLUS
1000.0000 mL | Freq: Once | INTRAVENOUS | Status: AC
  Administered 2020-06-29: 1000 mL via INTRAVENOUS

## 2020-06-29 NOTE — H&P (Signed)
NAME:  Christopher [name], MRN:  [id number], DOB:  03-01-1946, LOS: 0 ADMISSION DATE:  06/28/2020, CONSULTATION DATE:  06/29/20 REFERRING MD:  EDP, CHIEF COMPLAINT:  Cardiac arrest   Brief History   74 y.o. M with PMH of memory loss, HTN who had a witness arrest while sitting on the couch, 5 minutes down time without CPR.  EMS found patient in Vfib and pt was defibrillated 6 times and received 4 rounds EPI.  Intubated in the ED and PCCM consulted for admission  History of present illness   74 y.o. M with PMH of HTN, tobacco use and COPD who was in his usual state of health today, friend at the bedside said they ate dinner together and patient was well with no complaints.  Later was sitting on the couch and had a witnessed arrest, daughter called 911, approximately 5 minutes without CPR then EMS EMS found patient in Vfib and pt was defibrillated 6 times and received 4 rounds EPI.    Pt was Intubated in the ED, CT head was negative for acute findings, he was initially hypotensive and started on Levophed.  Blood pressure improved and pressors were stopped and he became hypotensive.  Patient was not given any sedation or induction medications for intubation, he did not show signs of responsiveness throughout ED course.  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   Significant Hospital Events   7/25 Admit to PCCM  Consults:  Cardiology  Procedures:  7/25 ETT  Significant Diagnostic Tests:  7/25 CXR>>Diffuse interstitial prominence, of uncertain acuity. This could reflect mild interstitial edema versus chronic interstitial lung disease. 7/25 CT head>> no acute findings, white matter disease, greatest in the left frontal lobe  Micro Data:  7/25 SARS-CoV-2>> negative  Antimicrobials:     Interim history/subjective:  Patient  had a transient desaturation in the ED, O2 increased to 100% with improvement  Objective   Blood pressure (!) 180/101, pulse (!) 32, temperature (!) 96.2 F (35.7 C), resp. rate (!) 25, height 5\' 8"  (1.727 m), weight 81.6 kg, SpO2 (!) 88 %.    Vent Mode: PRVC FiO2 (%):  [100 %] 100 % Set Rate:  [18 bmp-22 bmp] 22 bmp Vt Set:  [550 mL] 550 mL PEEP:  [5 cmH20] 5 cmH20 Plateau Pressure:  [16 cmH20] 16 cmH20  No intake or output data in the 24 hours ending 06/29/20 0408 Filed Weights   06/29/20 0007  Weight: 81.6 kg   General:   Well-nourished male, intubated and nonresponsive HEENT: MM pink/moist ET tube in place Neuro: Unresponsive to pain, no spontaneous movement, not breathing over the ventilator, no gag, no corneal reflex, pupils 2 mm and responsive to light CV: s1s2 regular, no m/r/g PULM: Coarse breath sounds throughout GI: soft, bsx4 active  Extremities: warm/dry,  edema  Skin: no rashes or lesions   Resolved Hospital Problem list     Assessment & Plan:   Witnessed out of hospital V. fib cardiac arrest with hypoxic respiratory failure Approximately 5 minutes down time without CPR, then 15 minutes ACLS shocked 6 times and 4 doses EPI by EMS  -Per TTM2 trial target normothermia 36.5-37.5 with shivering avoidance -CT head negative for bleed -Evaluated by Cardiology, no STEMI on EKG, rising troponin, on heparin gtt, ASA and cards will consider LHC if pt shows signs of neurologic recovery -Echo -EEG --  Maintain full vent support with SAT/SBT as tolerated -titrate Vent setting to maintain SpO2 greater than or equal to 90%. -HOB elevated 30 degrees. -Plateau pressures less than 30 cm H20.  -Follow chest x-ray, ABG prn.   -Bronchial hygiene and RT/bronchodilator protocol. -Pt did not receive RSI medications or sedation and is unresponsive, poor initial prognosis, I updated pt's friend Elenore Rota at the bedside, pt's daughter in the hospital but was too upset to discuss patient's  status   Acute Kidney Injury Most recent creatinine 1.1 last month, now 1.9 likely secondary to hypoperfusion -place foley, trend UOP and renal indices  Hypokalemia -3.2 on arrival, given 27meq's and mag repleted    COPD and tobacco abuse Current smoker -Scheduled duonebs    Best practice:  Diet: NPO Pain/Anxiety/Delirium protocol (if indicated): Fentany; VAP protocol (if indicated): HOB  DVT prophylaxis: Heparin GI prophylaxis: protonix Glucose control: SSI Mobility: bed rest Code Status: full code Family Communication: friend called daughter while I was at the bedside, she did not want to discuss patient's prognosis Disposition: ICU  Labs   CBC: Recent Labs  Lab 06/29/20 0009 06/29/20 0037  WBC 15.8*  --   NEUTROABS 9.4*  --   HGB 15.4 16.3  HCT 49.6 48.0  MCV 93.1  --   PLT 236  --     Basic Metabolic Panel: Recent Labs  Lab 06/29/20 0009 06/29/20 0037 06/29/20 0314  NA 142 145  --   K 3.2* 3.1*  --   CL 108  --   --   CO2 22  --   --   GLUCOSE 221*  --   --   BUN 19  --   --   CREATININE 1.96*  --   --   CALCIUM 8.3*  --   --   MG  --   --  1.9   GFR: Estimated Creatinine Clearance: 32.5 mL/min (A) (by C-G formula based on SCr of 1.96 mg/dL (H)). Recent Labs  Lab 06/29/20 0009  WBC 15.8*  LATICACIDVEN 5.6*    Liver Function Tests: Recent Labs  Lab 06/29/20 0009  AST 102*  ALT 101*  ALKPHOS 110  BILITOT 0.7  PROT 6.1*  ALBUMIN 3.1*   No results for input(s): LIPASE, AMYLASE in the last 168 hours. No results for input(s): AMMONIA in the last 168 hours.  ABG    Component Value Date/Time   PHART 7.253 (L) 06/29/2020 0037   PCO2ART 60.1 (H) 06/29/2020 0037   PO2ART 411 (H) 06/29/2020 0037   HCO3 26.8 06/29/2020 0037   TCO2 29 06/29/2020 0037   ACIDBASEDEF 2.0 06/29/2020 0037   O2SAT 100.0 06/29/2020 0037     Coagulation Profile: No results for input(s): INR, PROTIME in the last 168 hours.  Cardiac Enzymes: No results  for input(s): CKTOTAL, CKMB, CKMBINDEX, TROPONINI in the last 168 hours.  HbA1C: No results found for: HGBA1C  CBG: Recent Labs  Lab 06/29/20 0340  GLUCAP 215*    Review of Systems:   Unable to obtain secondary to mental status  Past Medical History  He,  has a past medical history of Colon polyp, Hypertension, Memory disorder (01/04/2017), Migraine, Mononucleosis (1990s), Rash, Shortness of breath (~1998), Skin moles, and Wrist fracture.   Surgical History    Past Surgical History:  Procedure Laterality Date  . TRIGGER FINGER RELEASE Right      Social History   reports that he has been smoking. He has been smoking about 0.75 packs per day. He  has never used smokeless tobacco. He reports current alcohol use. He reports that he does not use drugs.   Family History   His family history includes Heart disease in his mother; Parkinson's disease in his brother and mother.   Allergies Allergies  Allergen Reactions  . Penicillins Rash     Home Medications  Prior to Admission medications   Medication Sig Start Date End Date Taking? Authorizing Provider  albuterol (PROAIR HFA) 108 (90 Base) MCG/ACT inhaler INHALE 2 PUFFS INTO THE LUNGS EVERY 6 (SIX) HOURS AS NEEDED FOR WHEEZING. 12/22/13   [provider]  aspirin 81 MG tablet Take 81 mg by mouth daily.      [provider]  Docusate Calcium (STOOL SOFTENER PO) Take by mouth.    [provider]  losartan-hydrochlorothiazide (HYZAAR) 100-12.5 MG per tablet Take 1 tablet by mouth daily.      [provider]  Multiple Vitamin (MULTIVITAMIN) tablet Take 1 tablet by mouth daily.    [provider]  Multiple Vitamins-Minerals ([employer] SPECTRAVITE PO) Take by mouth.    [provider]  Omega-3 Fatty Acids (FISH OIL PO) Take by mouth.    [provider]  SYMBICORT 80-4.5 MCG/ACT inhaler INHALE TWO PUFFS INTO THE LUNGS 2 (TWO) TIMES DAILY. 12/05/16   [provider]    valACYclovir (VALTREX) 1000 MG tablet Take 500 mg by mouth daily.      [provider]  varenicline (CHANTIX CONTINUING MONTH PAK) 1 MG tablet USE AS DIRECTED 09/07/17   [provider]     Critical care time: 65 minutes    CRITICAL CARE Performed by: Otilio Carpen Garald Rhew   Total critical care time: 65 minutes  Critical care time was exclusive of separately billable procedures and treating other patients.  Critical care was necessary to treat or prevent imminent or life-threatening deterioration.  Critical care was time spent personally by me on the following activities: development of treatment plan with patient and/or surrogate as well as nursing, discussions with consultants, evaluation of patient's response to treatment, examination of patient, obtaining history from patient or surrogate, ordering and performing treatments and interventions, ordering and review of laboratory studies, ordering and review of radiographic studies, pulse oximetry and re-evaluation of patient's condition.  Otilio Carpen Albertus Chiarelli, PA-C

## 2020-06-29 NOTE — Progress Notes (Addendum)
LTM EEG hooked up and running - no initial skin breakdown - push button tested - neuro notified.  

## 2020-06-29 NOTE — Progress Notes (Signed)
ANTICOAGULATION CONSULT NOTE   Pharmacy Consult for heparin Indication: r/o ACS s/p cardiac arrest  Allergies  Allergen Reactions  . Penicillins Rash    Patient Measurements: Height: 5\' 8"  (172.7 cm) Weight: 80.1 kg (176 lb 9.4 oz) IBW/kg (Calculated) : 68.4  Vital Signs: Temp: 100 F (37.8 C) (07/25 1800) BP: 106/63 (07/25 1927) Pulse Rate: 84 (07/25 1927)  Labs: Recent Labs    06/29/20 0009 06/29/20 0037 06/29/20 5621 06/29/20 0427 06/29/20 0427 06/29/20 0637 06/29/20 0805 06/29/20 0932 06/29/20 2000  HGB 15.4   < >  --  17.3*   < > 15.9 16.7  --   --   HCT 49.6   < >  --  51.0  --  50.3 49.0  --   --   PLT 236  --   --   --   --  249  --   --   --   HEPARINUNFRC  --   --   --   --   --   --   --  0.15* 0.35  CREATININE 1.96*  --   --   --   --  1.87*  --   --   --   TROPONINIHS 344*  --  1,322*  --   --   --   --   --   --    < > = values in this interval not displayed.    Estimated Creatinine Clearance: 34 mL/min (A) (by C-G formula based on SCr of 1.87 mg/dL (H)).   Medical History: Past Medical History:  Diagnosis Date  . Colon polyp   . Hypertension   . 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 Migraine   . Mononucleosis 1990s  . Rash   . Shortness of breath ~1998   Pneumonia   . Skin moles    abnormal  . Wrist fracture    Childhood    Assessment: 74yo male had witnessed arrest at home, EMS found pt to be in Vfib and gave shocks, epi, and amiodarone with ROSC, now w/ troponin elevated, to begin heparin.  Heparin level therapeutic at 0.35. No reported bleeding.   Goal of Therapy:  Heparin level 0.3-0.7 units/ml Monitor platelets by anticoagulation protocol: Yes   Plan:  Continue heparin infusion to 1200 units/hr  Monitor HL, CBC, and for s/sx of bleeding  Benetta Spar, PharmD, BCPS, BCCP Clinical Pharmacist  Please check AMION for all [place] phone numbers After 10:00 PM, call Anon Raices [phone]

## 2020-06-29 NOTE — ED Notes (Signed)
Provider to update family.  RN and Respiratory to CT w/ PT

## 2020-06-29 NOTE — Progress Notes (Signed)
NAME:  Christopher [name], MRN:  [id number], DOB:  10-Apr-1946, LOS: 0 ADMISSION DATE:  06/28/2020, CONSULTATION DATE:  06/29/20 REFERRING MD:  EDP, CHIEF COMPLAINT:  Cardiac arrest   Brief History   74 y.o. M with PMH of memory loss, HTN who had a witness arrest while sitting on the couch, 5 minutes down time without CPR.  EMS found patient in Vfib and pt was defibrillated 6 times and received 4 rounds EPI.  Intubated in the ED and PCCM consulted for admission  History of present illness   74 y.o. M with PMH of HTN, tobacco use and COPD who was in his usual state of health today, friend at the bedside said they ate dinner together and patient was well with no complaints.  Later was sitting on the couch and had a witnessed arrest, daughter called 911, approximately 5 minutes without CPR then EMS EMS found patient in Vfib and pt was defibrillated 6 times and received 4 rounds EPI.    Pt was Intubated in the ED, CT head was negative for acute findings, he was initially hypotensive and started on Levophed.  Blood pressure improved and pressors were stopped and he became hypotensive.  Patient was not given any sedation or induction medications for intubation, he did not show signs of responsiveness throughout ED course.  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   Significant Hospital Events   7/25 Admit to PCCM  Consults:  Cardiology  Procedures:  7/25 ETT  Significant Diagnostic Tests:  7/25 CXR>>Diffuse interstitial prominence, of uncertain acuity. This could reflect mild interstitial edema versus chronic interstitial lung disease. 7/25 CT head>> no acute findings, white matter disease, greatest in the left frontal lobe  Micro Data:  7/25 SARS-CoV-2>> negative  Antimicrobials:     Interim history/subjective:   Remains  normothermic.   Objective   Blood pressure (!) 163/69, pulse 82, temperature 98.4 F (36.9 C), resp. rate (!) 35, height _0  (1.727 m), weight 80.1 kg, SpO2 96 %.    Vent Mode: PRVC FiO2 (%):  [40 %-100 %] 40 % Set Rate:  [18 bmp-22 bmp] 22 bmp Vt Set:  [550 mL] 550 mL PEEP:  [5 cmH20] 5 cmH20 Plateau Pressure:  [16 cmH20-20 cmH20] 20 cmH20   Intake/Output Summary (Last 24 hours) at 06/29/2020 1414 Last data filed at 06/29/2020 1200 Gross per 24 hour  Intake 1000.77 ml  Output 1000 ml  Net 0.77 ml   Filed Weights   06/29/20 0007 06/29/20 0521  Weight: 81.6 kg 80.1 kg   General:   Well-nourished male, intubated and nonresponsive HEENT: MM pink/moist ET tube in place Neuro: Unresponsive to pain, no spontaneous movement, not breathing over the ventilator, no gag, no corneal reflex, pupils 2 mm and responsive to light. Winces to central pain.  CV: s1s2 regular, no m/r/g PULM: Coarse breath sounds throughout GI: soft, bsx4 active  Extremities: warm/dry,  edema  Skin: no rashes or lesions  Bedside echo 7/25:  LVH, LV normal size, LV systolic function reduced with EF 30% and lateral wall hypokinesis. Elevated left sided filling pressures. Mild AV calcification without stenosis. Mild MAC with mild regurgitation. Normal RV size and function. Was not able to calculate RVSP. IVC dilated with no respiratory variation. RAP estimated at [facility] Problem list     Assessment & Plan:  Critically ill due to Witnessed out of hospital V. fib cardiac arrest due to ischemic cardiomyopathy.  No further VF. - Stop amiodarone per cardiology - TTM to 37.5 -Continue sedation for 48h - Neuroprognostication at 96h  Acute decompensated systolic heart failure  Elevated filling pressures.  - Diuresis - Secondary prevention.   Critically ill due to hypoxic respiratory failure - Daily SBT but mental status will preclude extubation.   Acute Kidney Injury Most recent creatinine 1.1  last month, now 1.9 likely secondary to hypoperfusion -place foley, trend UOP and renal indices  COPD and tobacco abuse Current smoker -Scheduled duonebs  Best practice:  Diet: NPO - started tube feeds Pain/Anxiety/Delirium protocol (if indicated): Fentany and propofol infusions.  VAP protocol (if indicated): HOB  DVT prophylaxis: Heparin GI prophylaxis: protonix Glucose control: SSI Mobility: bed rest Code Status: full code Family Communication: will update today. Disposition: ICU  Labs   CBC: Recent Labs  Lab 06/29/20 0009 06/29/20 0037 06/29/20 0427 06/29/20 0637 06/29/20 0805  WBC 15.8*  --   --  18.2*  --   NEUTROABS 9.4*  --   --   --   --   HGB 15.4 16.3 17.3* 15.9 16.7  HCT 49.6 48.0 51.0 50.3 49.0  MCV 93.1  --   --  92.5  --   PLT 236  --   --  249  --     Basic Metabolic Panel: Recent Labs  Lab 06/29/20 0009 06/29/20 0037 06/29/20 0314 06/29/20 0427 06/29/20 0636 06/29/20 0637 06/29/20 0805 06/29/20 0932  NA 142 145  --  146*  --  138 142  --   K 3.2* 3.1*  --  3.1*  --  3.9 3.9  --   CL 108  --   --   --   --  108  --   --   CO2 22  --   --   --   --  18*  --   --   GLUCOSE 221*  --   --   --   --  292*  --   --   BUN 19  --   --   --   --  24*  --   --   CREATININE 1.96*  --   --   --   --  1.87*  --   --   CALCIUM 8.3*  --   --   --   --  7.5*  --   --   MG  --   --  1.9  --  2.2  --   --  2.9*  PHOS  --   --   --   --   --  4.9*  --  3.4   GFR: Estimated Creatinine Clearance: 34 mL/min (A) (by C-G formula based on SCr of 1.87 mg/dL (H)). Recent Labs  Lab 06/29/20 0009 06/29/20 0314 06/29/20 0637  WBC 15.8*  --  18.2*  LATICACIDVEN 5.6* 4.5*  --     Liver Function Tests: Recent Labs  Lab 06/29/20 0009  AST 102*  ALT 101*  ALKPHOS 110  BILITOT 0.7  PROT 6.1*  ALBUMIN 3.1*   No results for input(s): LIPASE, AMYLASE in the last 168 hours. No results for input(s): AMMONIA in the last 168 hours.  ABG    Component Value  Date/Time   PHART 7.298 (L) 06/29/2020 0805   PCO2ART 42.3 06/29/2020 0805   PO2ART 250 (H) 06/29/2020 0805   HCO3  21.1 06/29/2020 0805   TCO2 22 06/29/2020 0805   ACIDBASEDEF 6.0 (H) 06/29/2020 0805   O2SAT 100.0 06/29/2020 0805     Coagulation Profile: No results for input(s): INR, PROTIME in the last 168 hours.  Cardiac Enzymes: No results for input(s): CKTOTAL, CKMB, CKMBINDEX, TROPONINI in the last 168 hours.  HbA1C: Hgb A1c MFr Bld  Date/Time Value Ref Range Status  06/29/2020 03:24 AM 6.1 (H) 4.8 - 5.6 % Final    Comment:    (NOTE) Pre diabetes:          5.7%-6.4%  Diabetes:              >6.4%  Glycemic control for   <7.0% adults with diabetes     CBG: Recent Labs  Lab 06/29/20 0340 06/29/20 0529 06/29/20 0655 06/29/20 0729 06/29/20 1216  GLUCAP 215* 252* 265* 257* 116*     CRITICAL CARE Performed by: Kipp Brood   Total critical care time: 40 minutes of additional critical care time.  Critical care time was exclusive of separately billable procedures and treating other patients.  Critical care was necessary to treat or prevent imminent or life-threatening deterioration.  Critical care was time spent personally by me on the following activities: development of treatment plan with patient and/or surrogate as well as nursing, discussions with consultants, evaluation of patient's response to treatment, examination of patient, obtaining history from patient or surrogate, ordering and performing treatments and interventions, ordering and review of laboratory studies, ordering and review of radiographic studies, pulse oximetry and re-evaluation of patient's condition.  Kipp Brood, MD

## 2020-06-29 NOTE — ED Notes (Signed)
RN stopped levo, provider aware of blood pressure/bedside.

## 2020-06-29 NOTE — Progress Notes (Signed)
EEG complete - results pending.  Informed by nurse pt is TTE, per doctor will be 24hr

## 2020-06-29 NOTE — ED Notes (Addendum)
Provider notified of critical lab values and blood pressure

## 2020-06-29 NOTE — Progress Notes (Signed)
   06/29/20 1814  Vitals  ECG Heart Rate 88  Resp (!) 35  Oxygen Therapy  SpO2 96 %  Art Line  Arterial Line BP 217/98  Arterial Line MAP (mmHg) 132 mmHg  MEWS Score  MEWS Temp 0  MEWS Systolic 0  MEWS Pulse 0  MEWS RR 2  MEWS LOC 3  MEWS Score 5  MEWS Score Color Red   Labile blood pressure SBP 90-200. Higher pressures correlate to increased respiration rate, mild increase in heart rate (from 50s at rest to 80s during these episodes), and possible posturing (pt's head was difficult to lift off the pillow when assisting in EEG lead placement). Concern for sympathetic storming communicated to CCM.  Picked pt up around 1300; sister at bedside. Visitor list updated to include pt's sister, [place], and his daughter, Orson Slick. Core temperature slowly increasing (cool extremities); tylenol ineffective. Arctic sun initiated. Copious oral secretions

## 2020-06-29 NOTE — Consult Note (Signed)
Cardiology Consultation:   Patient ID: Christopher [name]; [id number]; Mar 28, 1946   Admit date: 06/28/2020 Date of Consult: 06/29/2020  Primary Care Provider: Thurman Coyer, MD Primary Cardiologist: No primary care provider on file. Primary Electrophysiologist:  None  Chief Complaint: cardiac arrest  Patient Profile:   Christopher [name] is a 74 y.o. male with a hx of COPD who presents with cardiac arrest.  History of Present Illness:   Patient presents to the emergency department after collapsing at home.  Family report that he became unresponsive while sitting on the couch.  There was approximately a 5-minute delay before first responders arrived on the scene and found him to be pulseless.  He had CPR initiated by the first responders upon their arrival.  EMS placed a King's airway, continued CPR.  He was found to be in V. fib, was shocked 6 times and given 4 doses of epi before pulses were regained.  Patient administered amiodarone by EMS.  CPR lasted for 15 minutes total.  On arrival to the ED, patient was intubated for airway protection.  ECG demonstrated sinus rhythm without STEMI.  Labs with troponin 344 -> 1322, lactate 5.6 -> 4.5, WBC 15.8. Cr 2.0.  CT head negative for bleed.  Discussed with Dr. Martinique who agreed no indication for [facility] given lack of STEMI on ECG and uncertain neurologic status.   Past Medical History:  Diagnosis Date  . Colon polyp   . Hypertension   . 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 Migraine   . Mononucleosis 1990s  . Rash   . Shortness of breath ~1998   Pneumonia   . Skin moles    abnormal  . Wrist fracture    Childhood    Past Surgical History:  Procedure Laterality Date  . TRIGGER FINGER RELEASE Right      Inpatient Medications: Scheduled Meds: . acetaminophen  650 mg Oral Q4H   Or  . acetaminophen (TYLENOL) oral liquid 160 mg/5 mL  650 mg Per Tube Q4H   Or  . acetaminophen  650 mg Rectal Q4H  . insulin aspart  0-15 Units Subcutaneous Q4H    . ipratropium-albuterol  3 mL Nebulization Q6H  . pantoprazole (PROTONIX) IV  40 mg Intravenous QHS   Continuous Infusions: . sodium chloride    . amiodarone 60 mg/hr (06/29/20 0039)  . amiodarone    . heparin 1,000 Units/hr (06/29/20 0237)  . magnesium sulfate bolus IVPB    . norepinephrine (LEVOPHED) Adult infusion 2 mcg/min (06/29/20 0025)  . potassium chloride 10 mEq (06/29/20 0357)  . propofol (DIPRIVAN) infusion    . propofol     PRN Meds: Place/Maintain arterial line **AND** sodium chloride, acetaminophen, docusate sodium, labetalol, polyethylene glycol  Home Meds: Prior to Admission medications   Medication Sig Start Date End Date Taking? Authorizing Provider  albuterol (PROAIR HFA) 108 (90 Base) MCG/ACT inhaler INHALE 2 PUFFS INTO THE LUNGS EVERY 6 (SIX) HOURS AS NEEDED FOR WHEEZING. 12/22/13   [provider]  aspirin 81 MG tablet Take 81 mg by mouth daily.      [provider]  Docusate Calcium (STOOL SOFTENER PO) Take by mouth.    [provider]  losartan-hydrochlorothiazide (HYZAAR) 100-12.5 MG per tablet Take 1 tablet by mouth daily.      [provider]  Multiple Vitamin (MULTIVITAMIN) tablet Take 1 tablet by mouth daily.    [provider]  Multiple Vitamins-Minerals ([employer] SPECTRAVITE PO) Take by mouth.    [provider]  Omega-3 Fatty Acids (FISH OIL PO) Take by mouth.    [provider]  SYMBICORT 80-4.5 MCG/ACT inhaler INHALE TWO PUFFS INTO THE LUNGS 2 (TWO) TIMES DAILY. 12/05/16   [provider]  valACYclovir (VALTREX) 1000 MG tablet Take 500 mg by mouth daily.      [provider]  varenicline (CHANTIX CONTINUING MONTH PAK) 1 MG tablet USE AS DIRECTED 09/07/17   [provider]    Allergies:    Allergies  Allergen Reactions  . Penicillins Rash    Social History:   Social History   Socioeconomic History  . Marital status: Single    Spouse name: Not on file  .  Number of children: 2  . Years of education: 61  . Highest education level: Not on file  Occupational History  . Occupation: N/A  Tobacco Use  . Smoking status: Current Every Day Smoker    Packs/day: 0.75  . Smokeless tobacco: Never Used  . Tobacco comment: 10/06/17 1 PPD  Substance and Sexual Activity  . Alcohol use: Yes    Comment: Occasional  . Drug use: No  . Sexual activity: Not on file  Other Topics Concern  . Not on file  Social History Narrative   Lives at home w/ his daughter   Right-handed   Caffeine: 1 cup of coffee, 1 tea and 1 soda per day   Social Determinants of Health   Financial Resource Strain:   . Difficulty of Paying Living Expenses:   Food Insecurity:   . Worried About Charity fundraiser in the Last Year:   . Arboriculturist in the Last Year:   Transportation Needs:   . Film/video editor (Medical):   Marland Kitchen Lack of Transportation (Non-Medical):   Physical Activity:   . Days of Exercise per Week:   . Minutes of Exercise per Session:   Stress:   . Feeling of Stress :   Social Connections:   . Frequency of Communication with Friends and Family:   . Frequency of Social Gatherings with Friends and Family:   . Attends Religious Services:   . Active Member of Clubs or Organizations:   . Attends Archivist Meetings:   Marland Kitchen Marital Status:   Intimate Partner Violence:   . Fear of Current or Ex-Partner:   . Emotionally Abused:   Marland Kitchen Physically Abused:   . Sexually Abused:      Family History:    Family History  Problem Relation Age of Onset  . Heart disease Mother        Died at age 60  . Parkinson's disease Mother   . Parkinson's disease Brother       ROS:  Please see the history of present illness.   All other ROS reviewed and negative.     Physical Exam/Data:   Vitals:   06/29/20 0315 06/29/20 0330 06/29/20 0345 06/29/20 0425  BP: (!) 196/101 (!) 196/103 (!) 180/101   Pulse:    78  Resp: (!) 24 (!) 26 (!) 25 (!) 24  Temp:  (!) 96.7 F (35.9 C) (!) 96.3 F (35.7 C) (!) 96.2 F (35.7 C) (!) 96.1 F (35.6 C)  TempSrc:      SpO2: (!) 88%   90%  Weight:      Height:       No intake or output data in the 24 hours ending 06/29/20 0426 Last 3 Weights 06/29/2020 10/06/2017 01/04/2017  Weight (lbs) 179 lb  14.3 oz 180 lb 181 lb 8 oz  Weight (kg) 81.6 kg 81.647 kg 82.328 kg     Body mass index is 27.35 kg/m.  General: intubated Head: Normocephalic, atraumatic, sclera non-icteric, no xanthomas, nares are without discharge.  Neck: Negative for carotid bruits. JVD not elevated. Lungs: Clear bilaterally to auscultation without wheezes, rales, or rhonchi. Heart: RRR with S1 S2. No murmurs, rubs, or gallops appreciated. Abdomen: Soft, non-tender, non-distended with normoactive bowel sounds. No hepatomegaly. No rebound/guarding. No obvious abdominal masses. Msk:  Strength and tone appear normal for age. Extremities: No clubbing or cyanosis. No edema.  Distal pedal pulses are 2+ and equal bilaterally. Neuro: intubated/sedated   EKG:  The EKG was personally reviewed and demonstrates:  Sinus rhythm, RBBB Telemetry:  Telemetry was personally reviewed and demonstrates:  Sinus rhythm  Relevant CV Studies: None  Laboratory Data:  High Sensitivity Troponin:   Recent Labs  Lab 06/29/20 0009 06/29/20 0209  TROPONINIHS 344* 1,322*     Cardiac EnzymesNo results for input(s): TROPONINI in the last 168 hours. No results for input(s): TROPIPOC in the last 168 hours.  Chemistry Recent Labs  Lab 06/29/20 0009 06/29/20 0037  NA 142 145  K 3.2* 3.1*  CL 108  --   CO2 22  --   GLUCOSE 221*  --   BUN 19  --   CREATININE 1.96*  --   CALCIUM 8.3*  --   GFRNONAA 33*  --   GFRAA 38*  --   ANIONGAP 12  --     Recent Labs  Lab 06/29/20 0009  PROT 6.1*  ALBUMIN 3.1*  AST 102*  ALT 101*  ALKPHOS 110  BILITOT 0.7   Hematology Recent Labs  Lab 06/29/20 0009 06/29/20 0037  WBC 15.8*  --   RBC 5.33  --   HGB  15.4 16.3  HCT 49.6 48.0  MCV 93.1  --   MCH 28.9  --   MCHC 31.0  --   RDW 13.2  --   PLT 236  --    BNP Recent Labs  Lab 06/29/20 0009  BNP 412.0*    DDimer No results for input(s): DDIMER in the last 168 hours.   Radiology/Studies:  DG Abdomen 1 View  Result Date: 06/29/2020 CLINICAL DATA:  Tube placement EXAM: ABDOMEN - 1 VIEW COMPARISON:  None. FINDINGS: The enteric tube tip projects over the gastric body. The visualized bowel gas pattern is unremarkable. IMPRESSION: Enteric tube tip projects over the gastric body. Electronically Signed   By: Constance Holster M.D.   On: 06/29/2020 00:31   CT HEAD WO CONTRAST  Result Date: 06/29/2020 CLINICAL DATA:  Mental status change with unknown cause. EXAM: CT HEAD WITHOUT CONTRAST TECHNIQUE: Contiguous axial images were obtained from the base of the skull through the vertex without intravenous contrast. COMPARISON:  MRI dated 01/13/2017 FINDINGS: Brain: No hemorrhage. No extraaxial collection.No midline shift. There is mild atrophy.The basal cisterns are unremarkable. Asymmetric relatively confluent white matter disease is noted, greatest in the left frontal lobe. This has progressed since the patient's prior MRI given differences and technique. 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 The brainstem is unremarkable. The cerebellum is unremarkable. The sella is unremarkable. Vascular: No hyperdense vessel or unexpected calcification. Skull: The calvarium is unremarkable. The skull base is unremarkable. The visualized upper cervical spine is unremarkable. Sinuses/Orbits: The visualized orbits are unremarkable. The paranasal sinuses are unremarkable. The mastoid air cells are clear. Other: The visualized parotid gland  is unremarkable. There is no scalp soft tissue swelling. IMPRESSION: 1. No acute intracranial abnormality. 2. Asymmetric relatively confluent white matter disease, greatest in the left frontal lobe. This has progressed since  the patient's prior MRI given differences and technique. This is nonspecific but can be seen in the setting of chronic microvascular ischemia. Electronically Signed   By: Constance Holster M.D.   On: 06/29/2020 01:47   DG Chest Portable 1 View  Result Date: 06/29/2020 CLINICAL DATA:  Hypoxia, decreased breath sounds on left EXAM: PORTABLE CHEST 1 VIEW COMPARISON:  06/29/2020 at 12:11 a.m. FINDINGS: Single frontal view of the chest demonstrates stable endotracheal tube, enteric catheter, and external defibrillator pads. The cardiac silhouette is stable. There is persistent diffuse interstitial prominence. There is developing consolidation within the left lung base, which may reflect airspace disease or atelectasis. No effusion or pneumothorax. No acute bony abnormality. IMPRESSION: 1. Stable support devices. 2. Developing left basilar consolidation which may reflect airspace disease, aspiration, or atelectasis. Electronically Signed   By: Randa Ngo M.D.   On: 06/29/2020 03:15   DG Chest Portable 1 View  Result Date: 06/29/2020 CLINICAL DATA:  Intubated status post CPR EXAM: PORTABLE CHEST 1 VIEW COMPARISON:  04/02/2007 FINDINGS: Single frontal view of the chest demonstrates endotracheal tube overlying tracheal air column tip just below thoracic inlet. Enteric catheter passes below diaphragm tip excluded by collimation. Defibrillator pads overlie the left lower chest. Cardiac silhouette is unremarkable. There is diffuse interstitial prominence without airspace disease, effusion, or pneumothorax. No acute bony abnormalities. IMPRESSION: 1. Support devices as above. 2. Diffuse interstitial prominence, of uncertain acuity. This could reflect mild interstitial edema versus chronic interstitial lung disease. Electronically Signed   By: Randa Ngo M.D.   On: 06/29/2020 00:30    Assessment and Plan:   1. VF Arrest 2. NSTEMI VF arrest without STEMI on ECG.  Troponin elevated, which may be due to CPR  or type 1 NSTEMI.  Currently intubated/sedated and hemodynamically stable. -- IV amiodarone for VT/VF suppression -- IV heparin for NSTEMI -- echocardiogram today -- will consider LHC if patient has neurologic recovery      For questions or updates, please contact [facility] Please consult [email or web address] for contact info under     Signed, Arika Mainer S, MD  06/29/2020 4:26 AM

## 2020-06-29 NOTE — ED Notes (Signed)
Ice applied to pt. Per provider bedside

## 2020-06-29 NOTE — Progress Notes (Signed)
Pharmacy Note Magnesium level 1.9mg /dl  Will give magn 4gm IV x 1 then recheck mag level at 10:30 Excell Seltzer, PharmD

## 2020-06-29 NOTE — Progress Notes (Signed)
[facility] Progress Note Patient Name: Christopher [name] DOB: 04/06/1946 MRN: [id number]   Date of Service  06/29/2020  HPI/Events of Note  Neurostorm - Complicated by hypotension when Propofol IV infusion is increased. No CVL or CVP.   eICU Interventions  Plan: 1. Bolus with 0.9 NaCl 500 mL IV over 30 minutes now.  2. Phenylephrine IV infusion via PIV. Titrate to MAP > 70.  3. Titrate Propofol as tolerated to control neurostorm.         Vollie Brunty Cornelia Copa 06/29/2020, 7:53 PM

## 2020-06-29 NOTE — ED Triage Notes (Signed)
Per EMS, pt had a witnessed arrest as he was on the couch.  Estimated 5 minute down time before fire arrived started CPR.  He was found in VFIB, shocked 6 times, 4 epi, ameo.  15 minutes of CPR before ROS at 2325. EMS started EPI drip due to bp in the 70s, stopped before arrival.  [facility] airway was in place, pt breathing on own.    CBG120 114/78 upon arrival 90 heart rate 40cap.

## 2020-06-29 NOTE — ED Provider Notes (Signed)
[place] EMERGENCY DEPARTMENT Provider Note   CSN: [id number] Arrival date & time: 06/28/20  2359     History Chief Complaint  Patient presents with  . POST CPR    SHLOME BALDREE is a 74 y.o. male.  Patient presents to the emergency department after collapsing at home.  Family report that he became unresponsive while sitting on the couch.  There was approximately a 5-minute delay before first responders arrived on the scene and found him to be pulseless.  He had CPR initiated by the first responders upon their arrival.  EMS placed a King's airway, continued CPR.  He was found to be in V. fib, was shocked 6 times and given 4 doses of epi before pulses were regained.  Patient administered amiodarone by EMS.  CPR lasted for 15 minutes total. Level V Caveat due to acuity.        Past Medical History:  Diagnosis Date  . Colon polyp   . Hypertension   . 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 Migraine   . Mononucleosis 1990s  . Rash   . Shortness of breath ~1998   Pneumonia   . Skin moles    abnormal  . Wrist fracture    Childhood    Patient Active Problem List   Diagnosis Date Noted  . 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 HYPERLIPIDEMIA 08/23/2009  . MIGRAINE HEADACHE 08/23/2009  . HYPERTENSION 08/23/2009    Past Surgical History:  Procedure Laterality Date  . TRIGGER FINGER RELEASE Right        Family History  Problem Relation Age of Onset  . Heart disease Mother        Died at age 3  . Parkinson's disease Mother   . Parkinson's disease Brother     Social History   Tobacco Use  . Smoking status: Current Every Day Smoker    Packs/day: 0.75  . Smokeless tobacco: Never Used  . Tobacco comment: 10/06/17 1 PPD  Substance Use Topics  . Alcohol use: Yes    Comment: Occasional  . Drug use: No    Home Medications Prior to Admission medications   Medication Sig Start Date End Date Taking? Authorizing Provider  albuterol (PROAIR HFA) 108 (90 Base)  MCG/ACT inhaler INHALE 2 PUFFS INTO THE LUNGS EVERY 6 (SIX) HOURS AS NEEDED FOR WHEEZING. 12/22/13   [provider]  aspirin 81 MG tablet Take 81 mg by mouth daily.      [provider]  Docusate Calcium (STOOL SOFTENER PO) Take by mouth.    [provider]  losartan-hydrochlorothiazide (HYZAAR) 100-12.5 MG per tablet Take 1 tablet by mouth daily.      [provider]  Multiple Vitamin (MULTIVITAMIN) tablet Take 1 tablet by mouth daily.    [provider]  Multiple Vitamins-Minerals ([employer] SPECTRAVITE PO) Take by mouth.    [provider]  Omega-3 Fatty Acids (FISH OIL PO) Take by mouth.    [provider]  SYMBICORT 80-4.5 MCG/ACT inhaler INHALE TWO PUFFS INTO THE LUNGS 2 (TWO) TIMES DAILY. 12/05/16   [provider]  valACYclovir (VALTREX) 1000 MG tablet Take 500 mg by mouth daily.      [provider]  varenicline (CHANTIX CONTINUING MONTH PAK) 1 MG tablet USE AS DIRECTED 09/07/17   [provider]    Allergies    Penicillins  Review of Systems   Review of Systems  Unable to perform ROS: Acuity of condition    Physical Exam  Updated Vital Signs BP (!) 180/80   Pulse (!) 32   Temp (!) 97 F (36.1 C)   Resp 14   Ht 5\' 8"  (1.727 m)   Wt 81.6 kg   SpO2 100%   BMI 27.35 kg/m   Physical Exam Constitutional:      Appearance: He is ill-appearing.  HENT:     Head: Normocephalic and atraumatic.  Cardiovascular:     Rate and Rhythm: Normal rate and regular rhythm.     Heart sounds: Normal heart sounds.  Pulmonary:     Breath sounds: Wheezing present.     Comments: Bagged via King's airway, diminished bilaterally Abdominal:     General: There is distension.  Musculoskeletal:     Cervical back: Neck supple.     Right lower leg: Edema present.     Left lower leg: Edema present.  Skin:    General: Skin is dry.  Neurological:     Comments: Unresponsive     ED Results / Procedures /  Treatments   Labs (all labs ordered are listed, but only abnormal results are displayed) Labs Reviewed  CBC WITH DIFFERENTIAL/PLATELET - Abnormal; Notable for the following components:      Result Value   WBC 15.8 (*)    Neutro Abs 9.4 (*)    Lymphs Abs 4.3 (*)    Abs Immature Granulocytes 1.01 (*)    All other components within normal limits  COMPREHENSIVE METABOLIC PANEL - Abnormal; Notable for the following components:   Potassium 3.2 (*)    Glucose, Bld 221 (*)    Creatinine, Ser 1.96 (*)    Calcium 8.3 (*)    Total Protein 6.1 (*)    Albumin 3.1 (*)    AST 102 (*)    ALT 101 (*)    GFR calc non Af Amer 33 (*)    GFR calc Af Amer 38 (*)    All other components within normal limits  BRAIN NATRIURETIC PEPTIDE - Abnormal; Notable for the following components:   B Natriuretic Peptide 412.0 (*)    All other components within normal limits  LACTIC ACID, PLASMA - Abnormal; Notable for the following components:   Lactic Acid, Venous 5.6 (*)    All other components within normal limits  I-STAT ARTERIAL BLOOD GAS, ED - Abnormal; Notable for the following components:   pH, Arterial 7.253 (*)    pCO2 arterial 60.1 (*)    pO2, Arterial 411 (*)    Potassium 3.1 (*)    All other components within normal limits  TROPONIN I (HIGH SENSITIVITY) - Abnormal; Notable for the following components:   Troponin I (High Sensitivity) 344 (*)    All other components within normal limits  SARS CORONAVIRUS 2 BY RT PCR (HOSPITAL ORDER, [facility])  BLOOD GAS, ARTERIAL  HEPARIN LEVEL (UNFRACTIONATED)  LACTIC ACID, PLASMA  TROPONIN I (HIGH SENSITIVITY)    EKG EKG Interpretation  Date/Time:  Sunday June 29 2020 00:06:08 EDT Ventricular Rate:  115 PR Interval:    QRS Duration: 146 QT Interval:  364 QTC Calculation: 504 R Axis:   -86 Text Interpretation: Sinus tachycardia Probable left atrial enlargement RBBB and LAFB Nonspecific ST elevation Reconfirmed by Orpah Greek [id number]) on 06/29/2020 12:55:08 AM   Radiology DG Abdomen 1 View  Result Date: 06/29/2020 CLINICAL DATA:  Tube placement EXAM: ABDOMEN - 1 VIEW COMPARISON:  None. FINDINGS: The enteric tube tip projects over the gastric body. The visualized bowel  gas pattern is unremarkable. IMPRESSION: Enteric tube tip projects over the gastric body. Electronically Signed   By: Constance Holster M.D.   On: 06/29/2020 00:31   CT HEAD WO CONTRAST  Result Date: 06/29/2020 CLINICAL DATA:  Mental status change with unknown cause. EXAM: CT HEAD WITHOUT CONTRAST TECHNIQUE: Contiguous axial images were obtained from the base of the skull through the vertex without intravenous contrast. COMPARISON:  MRI dated 01/13/2017 FINDINGS: Brain: No hemorrhage. No extraaxial collection.No midline shift. There is mild atrophy.The basal cisterns are unremarkable. Asymmetric relatively confluent white matter disease is noted, greatest in the left frontal lobe. This has progressed since the patient's prior MRI given differences and technique. 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 The brainstem is unremarkable. The cerebellum is unremarkable. The sella is unremarkable. Vascular: No hyperdense vessel or unexpected calcification. Skull: The calvarium is unremarkable. The skull base is unremarkable. The visualized upper cervical spine is unremarkable. Sinuses/Orbits: The visualized orbits are unremarkable. The paranasal sinuses are unremarkable. The mastoid air cells are clear. Other: The visualized parotid gland is unremarkable. There is no scalp soft tissue swelling. IMPRESSION: 1. No acute intracranial abnormality. 2. Asymmetric relatively confluent white matter disease, greatest in the left frontal lobe. This has progressed since the patient's prior MRI given differences and technique. This is nonspecific but can be seen in the setting of chronic microvascular ischemia. Electronically Signed   By: Constance Holster  M.D.   On: 06/29/2020 01:47   DG Chest Portable 1 View  Result Date: 06/29/2020 CLINICAL DATA:  Intubated status post CPR EXAM: PORTABLE CHEST 1 VIEW COMPARISON:  04/02/2007 FINDINGS: Single frontal view of the chest demonstrates endotracheal tube overlying tracheal air column tip just below thoracic inlet. Enteric catheter passes below diaphragm tip excluded by collimation. Defibrillator pads overlie the left lower chest. Cardiac silhouette is unremarkable. There is diffuse interstitial prominence without airspace disease, effusion, or pneumothorax. No acute bony abnormalities. IMPRESSION: 1. Support devices as above. 2. Diffuse interstitial prominence, of uncertain acuity. This could reflect mild interstitial edema versus chronic interstitial lung disease. Electronically Signed   By: Randa Ngo M.D.   On: 06/29/2020 00:30    Procedures Procedure Name: Intubation Date/Time: 06/29/2020 1:38 AM Performed by: Orpah Greek, MD Pre-anesthesia Checklist: Patient identified, Patient being monitored, Emergency Drugs available, Timeout performed and Suction available Oxygen Delivery Method: Non-rebreather mask Preoxygenation: Pre-oxygenation with 100% oxygen Induction Type: Rapid sequence Ventilation: Mask ventilation without difficulty Laryngoscope Size: Glidescope and 3 Grade View: Grade I Tube size: 7.5 mm Number of attempts: 1 Placement Confirmation: ETT inserted through vocal cords under direct vision,  CO2 detector and Breath sounds checked- equal and bilateral Secured at: 24 cm Tube secured with: ETT holder Dental Injury: Teeth and Oropharynx as per pre-operative assessment     .Critical Care Performed by: Orpah Greek, MD Authorized by: Orpah Greek, MD   Critical care provider statement:    Critical care time (minutes):  40   Critical care was necessary to treat or prevent imminent or life-threatening deterioration of the following conditions:   Cardiac failure, CNS failure or compromise and respiratory failure   Critical care was time spent personally by me on the following activities:  Ordering and performing treatments and interventions, ordering and review of laboratory studies, development of treatment plan with patient or surrogate, discussions with consultants, ordering and review of radiographic studies, pulse oximetry, re-evaluation of patient's condition, evaluation of patient's response to treatment, examination of patient and review  of old charts   I assumed direction of critical care for this patient from another provider in my specialty: no     (including critical care time)  Medications Ordered in ED Medications  propofol (DIPRIVAN) 1000 MG/100ML infusion (has no administration in time range)  amiodarone (NEXTERONE PREMIX) 360-4.14 MG/200ML-% (1.8 mg/mL) IV infusion (60 mg/hr Intravenous New Bag/Given 06/29/20 0039)  amiodarone (NEXTERONE PREMIX) 360-4.14 MG/200ML-% (1.8 mg/mL) IV infusion (has no administration in time range)  norepinephrine (LEVOPHED) 4mg  in 299mL premix infusion (2 mcg/min Intravenous New Bag/Given 06/29/20 0025)  propofol (DIPRIVAN) 1000 MG/100ML infusion (has no administration in time range)  sodium chloride 0.9 % bolus 1,000 mL (has no administration in time range)  heparin bolus via infusion 2,000 Units (has no administration in time range)  heparin ADULT infusion 100 units/mL (25000 units/259mL sodium chloride 0.45%) (has no administration in time range)  aspirin suppository 300 mg (300 mg Rectal Given 06/29/20 0034)  etomidate (AMIDATE) injection (20 mg Intravenous Given 06/29/20 0001)  rocuronium (ZEMURON) injection (100 mg Intravenous Given 06/29/20 0001)  0.9 %  sodium chloride infusion (1,000 mLs Intravenous New Bag/Given 06/29/20 0005)    ED Course  I have reviewed the triage vital signs and the nursing notes.  Pertinent labs & imaging results that were available during my care of the patient  were reviewed by me and considered in my medical decision making (see chart for details).    MDM Rules/Calculators/A&P                          Patient arrived via EMS after cardiac arrest at home.  Patient had an estimated 5 minutes of downtime prior to onset of CPR.  He then went on to have 15 minutes of CPR by EMS before pulses were regained.  EMS report V. fib requiring a total of 6 shocks and 4 doses of epi.  He was briefly started on an epi drip but it was stopped prior to arrival because blood pressures improved.  At arrival patient is initiating breaths on his own but not doing anything else neurologically.  King's airway was exchanged for endotracheal tube without difficulty.  EKG was nonspecific at arrival, does have a right bundle branch block with some nonspecific ST elevations in inferior lateral leads.  This was discussed with Dr. Martinique, on-call for interventional cardiology.  He did not recommend taking the patient to the Cath Lab at this time.  Agrees with amnio drip, heparinization, aspirin.  Recommends following along neurologically before making any decisions on cardiac interventions.  Patient did start to become hypotensive here in the ER.  He was given IV fluids and started on Levophed.  He did not require titration of the Levophed as his blood pressure became normal and then somewhat hypertensive and Levophed was stopped.  Patient does have an elevated lactic acid.  Sepsis is not considered.  This is secondary to downtime from CPR.  Troponin also elevated at 344.  Will monitor.  Cooling initiated in the emergency department.  Discussed with critical care on-call, will see patient for admission.   Final Clinical Impression(s) / ED Diagnoses Final diagnoses:  Cardiac arrest [facility])    Rx / DC Orders ED Discharge Orders    None       Josslynn Mentzer, Gwenyth Allegra, MD 06/29/20 [phone]

## 2020-06-29 NOTE — Procedures (Signed)
Patient Name: Christopher [name]  MRN: [id number]  Epilepsy Attending: Lora Havens  Referring Physician/Provider: Elease Etienne, PA Date: 06/29/2020 Duration: 22.46 mins  Patient history: 74yo M s/p cardiac arrest. EEG to evaluate for seizure  Level of alertness:  Lethargic/sedated  AEDs during EEG study: Propofol  Technical aspects: This EEG study was done with scalp electrodes positioned according to the 10-20 International system of electrode placement. Electrical activity was acquired at a sampling rate of 500Hz  and reviewed with a high frequency filter of 70Hz  and a low frequency filter of 1Hz . EEG data were recorded continuously and digitally stored.   Description: No clear posterior dominant rhythm was seen. EEG showed continuous generalized 6-9hz Hz theta-alpha activity. Hyperventilation and photic stimulation were not performed.     ABNORMALITY -Continuous slow, generalized  IMPRESSION: This study is suggestive of severe diffuse encephalopathy, nonspecific etiology but likely related to sedation. No seizures or epileptiform discharges were seen throughout the recording.  [place] Holian Barbra Sarks

## 2020-06-29 NOTE — Progress Notes (Signed)
Per Dr. Lynetta Mare: temperature intervention only if febrile. [place] Administrator, sports are not to be placed.

## 2020-06-29 NOTE — Progress Notes (Signed)
ANTICOAGULATION CONSULT NOTE - Initial Consult  Pharmacy Consult for heparin Indication: r/o ACS s/p cardiac arrest  Allergies  Allergen Reactions  . Penicillins Rash    Patient Measurements: Height: 5\' 8"  (172.7 cm) Weight: 81.6 kg (179 lb 14.3 oz) IBW/kg (Calculated) : 68.4  Vital Signs: Temp: 97.1 F (36.2 C) (07/25 0145) Temp Source: Temporal (07/25 0006) BP: 190/91 (07/25 0145) Pulse Rate: 32 (07/25 0100)  Labs: Recent Labs    06/29/20 0009 06/29/20 0037  HGB 15.4 16.3  HCT 49.6 48.0  PLT 236  --   CREATININE 1.96*  --   TROPONINIHS 344*  --     Estimated Creatinine Clearance: 32.5 mL/min (A) (by C-G formula based on SCr of 1.96 mg/dL (H)).   Medical History: Past Medical History:  Diagnosis Date  . Colon polyp   . Hypertension   . 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 Migraine   . Mononucleosis 1990s  . Rash   . Shortness of breath ~1998   Pneumonia   . Skin moles    abnormal  . Wrist fracture    Childhood    Assessment: 74yo male had witnessed arrest at home, EMS found pt to be in Vfib and gave shocks, epi, and amiodarone with ROSC, now w/ troponin elevated, to begin heparin.  Goal of Therapy:  Heparin level 0.3-0.7 units/ml Monitor platelets by anticoagulation protocol: Yes   Plan:  Will give small bolus of heparin 2000 units followed by gtt at 1000 units/hr and monitor heparin levels and CBC.  Wynona Neat, PharmD, BCPS  06/29/2020,2:02 AM

## 2020-06-29 NOTE — Progress Notes (Signed)
[place] Progress Note Patient Name: ERDEM NAAS DOB: 10-30-1946 MRN: [id number]   Date of Service  06/29/2020  HPI/Events of Note  Nursing request for BMP in AM.  eICU Interventions  Will order BMP at 5 AM.     Intervention Category Major Interventions: Other:  Lysle Dingwall 06/29/2020, 10:55 PM

## 2020-06-29 NOTE — Progress Notes (Signed)
Patient transported from ED Trauma C to 1G52 with no complications.

## 2020-06-29 NOTE — Progress Notes (Addendum)
Progress Note  Patient Name: Christopher [name] Date of Encounter: 06/29/2020  [facility] Cardiologist: No primary care provider on file.     Patient Profile     74 y.o. male admitted with cardiac arrest, syncope found in VF shocked x 6  Hypotension requiring levo.  No prior cardiac history  ECG sinus RBBB./LAFB  No STEMI so no emergent cath  Echo prelim EF 30-35% w multiple WMA  Subjective   *intubated and non responsive  Inpatient Medications    Scheduled Meds:  acetaminophen  650 mg Oral Q4H   Or   acetaminophen (TYLENOL) oral liquid 160 mg/5 mL  650 mg Per Tube Q4H   Or   acetaminophen  650 mg Rectal Q4H   docusate  100 mg Oral BID   insulin aspart  0-15 Units Subcutaneous Q4H   ipratropium-albuterol  3 mL Nebulization Q6H   pantoprazole (PROTONIX) IV  40 mg Intravenous QHS   polyethylene glycol  17 g Oral Daily   Continuous Infusions:  sodium chloride     amiodarone 30 mg/hr (06/29/20 0800)   heparin 1,000 Units/hr (06/29/20 0800)   norepinephrine (LEVOPHED) Adult infusion Stopped (06/29/20 0639)   propofol (DIPRIVAN) infusion 11 mcg/kg/min (06/29/20 0800)   PRN Meds: Place/Maintain arterial line **AND** sodium chloride, acetaminophen, docusate sodium, fentaNYL (SUBLIMAZE) injection, fentaNYL (SUBLIMAZE) injection, labetalol, polyethylene glycol   Vital Signs    Vitals:   06/29/20 0630 06/29/20 0700 06/29/20 0753 06/29/20 0800  BP: 104/79 (!) 151/91 (!) 109/55 (!) 80/58  Pulse:   61   Resp: 19 18 22 22   Temp: (!) 96.1 F (35.6 C) (!) 92.3 F (33.5 C)  (!) 96.3 F (35.7 C)  TempSrc:      SpO2: 99% 99% 99% 98%  Weight:      Height:        Intake/Output Summary (Last 24 hours) at 06/29/2020 0858 Last data filed at 06/29/2020 0800 Gross per 24 hour  Intake 738.92 ml  Output 610 ml  Net 128.92 ml   Last 3 Weights 06/29/2020 06/29/2020 10/06/2017  Weight (lbs) 176 lb 9.4 oz 179 lb 14.3 oz 180 lb  Weight (kg) 80.1 kg 81.6 kg 81.647 kg        Telemetry    Sinus  - Personally Reviewed  ECG    Sinus with deep T wave inversions and QTc > 550 msec - Personally Reviewed  Physical Exam    GEN: intubated and sedated.   Neck:  JVD> 10 Cardiac: RRR, no murmurs, rubs, or gallops.  Respiratory: Clear to auscultation bilaterally. GI: Soft, nontender, non-distended  MS: No edema; No deformity. Neuro:  As above  Psych: Normal affect   Labs    High Sensitivity Troponin:   Recent Labs  Lab 06/29/20 0009 06/29/20 0209  TROPONINIHS 344* 1,322*      Chemistry Recent Labs  Lab 06/29/20 0009 06/29/20 0037 06/29/20 0427 06/29/20 0637 06/29/20 0805  NA 142   < > 146* 138 142  K 3.2*   < > 3.1* 3.9 3.9  CL 108  --   --  108  --   CO2 22  --   --  18*  --   GLUCOSE 221*  --   --  292*  --   BUN 19  --   --  24*  --   CREATININE 1.96*  --   --  1.87*  --   CALCIUM 8.3*  --   --  7.5*  --  PROT 6.1*  --   --   --   --   ALBUMIN 3.1*  --   --   --   --   AST 102*  --   --   --   --   ALT 101*  --   --   --   --   ALKPHOS 110  --   --   --   --   BILITOT 0.7  --   --   --   --   GFRNONAA 33*  --   --  35*  --   GFRAA 38*  --   --  40*  --   ANIONGAP 12  --   --  12  --    < > = values in this interval not displayed.     Hematology Recent Labs  Lab 06/29/20 0009 06/29/20 0037 06/29/20 0427 06/29/20 0637 06/29/20 0805  WBC 15.8*  --   --  18.2*  --   RBC 5.33  --   --  5.44  --   HGB 15.4   < > 17.3* 15.9 16.7  HCT 49.6   < > 51.0 50.3 49.0  MCV 93.1  --   --  92.5  --   MCH 28.9  --   --  29.2  --   MCHC 31.0  --   --  31.6  --   RDW 13.2  --   --  13.5  --   PLT 236  --   --  249  --    < > = values in this interval not displayed.    BNP Recent Labs  Lab 06/29/20 0009  BNP 412.0*     DDimer No results for input(s): DDIMER in the last 168 hours.   Radiology    DG Abdomen 1 View  Result Date: 06/29/2020 CLINICAL DATA:  Tube placement EXAM: ABDOMEN - 1 VIEW COMPARISON:  None. FINDINGS:  The enteric tube tip projects over the gastric body. The visualized bowel gas pattern is unremarkable. IMPRESSION: Enteric tube tip projects over the gastric body. Electronically Signed   By: Constance Holster M.D.   On: 06/29/2020 00:31   CT HEAD WO CONTRAST  Result Date: 06/29/2020 CLINICAL DATA:  Mental status change with unknown cause. EXAM: CT HEAD WITHOUT CONTRAST TECHNIQUE: Contiguous axial images were obtained from the base of the skull through the vertex without intravenous contrast. COMPARISON:  MRI dated 01/13/2017 FINDINGS: Brain: No hemorrhage. No extraaxial collection.No midline shift. There is mild atrophy.The basal cisterns are unremarkable. Asymmetric relatively confluent white matter disease is noted, greatest in the left frontal lobe. This has progressed since the patient's prior MRI given differences and technique. 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 The brainstem is unremarkable. The cerebellum is unremarkable. The sella is unremarkable. Vascular: No hyperdense vessel or unexpected calcification. Skull: The calvarium is unremarkable. The skull base is unremarkable. The visualized upper cervical spine is unremarkable. Sinuses/Orbits: The visualized orbits are unremarkable. The paranasal sinuses are unremarkable. The mastoid air cells are clear. Other: The visualized parotid gland is unremarkable. There is no scalp soft tissue swelling. IMPRESSION: 1. No acute intracranial abnormality. 2. Asymmetric relatively confluent white matter disease, greatest in the left frontal lobe. This has progressed since the patient's prior MRI given differences and technique. This is nonspecific but can be seen in the setting of chronic microvascular ischemia. Electronically Signed   By: Constance Holster M.D.   On: 06/29/2020  01:47   DG Chest Portable 1 View  Result Date: 06/29/2020 CLINICAL DATA:  Hypoxia, decreased breath sounds on left EXAM: PORTABLE CHEST 1 VIEW COMPARISON:   06/29/2020 at 12:11 a.m. FINDINGS: Single frontal view of the chest demonstrates stable endotracheal tube, enteric catheter, and external defibrillator pads. The cardiac silhouette is stable. There is persistent diffuse interstitial prominence. There is developing consolidation within the left lung base, which may reflect airspace disease or atelectasis. No effusion or pneumothorax. No acute bony abnormality. IMPRESSION: 1. Stable support devices. 2. Developing left basilar consolidation which may reflect airspace disease, aspiration, or atelectasis. Electronically Signed   By: Randa Ngo M.D.   On: 06/29/2020 03:15   DG Chest Portable 1 View  Result Date: 06/29/2020 CLINICAL DATA:  Intubated status post CPR EXAM: PORTABLE CHEST 1 VIEW COMPARISON:  04/02/2007 FINDINGS: Single frontal view of the chest demonstrates endotracheal tube overlying tracheal air column tip just below thoracic inlet. Enteric catheter passes below diaphragm tip excluded by collimation. Defibrillator pads overlie the left lower chest. Cardiac silhouette is unremarkable. There is diffuse interstitial prominence without airspace disease, effusion, or pneumothorax. No acute bony abnormalities. IMPRESSION: 1. Support devices as above. 2. Diffuse interstitial prominence, of uncertain acuity. This could reflect mild interstitial edema versus chronic interstitial lung disease. Electronically Signed   By: Randa Ngo M.D.   On: 06/29/2020 00:30    Cardiac Studies   Pending   Assessment & Plan    Cardiac arrest with lactic acidosis  QT prolongation   TW inversion pseudonormalizing  RBBB transient   Cardiomyopathy ? Cause   Renal insufficiency question acute versus chronic  Diabetes Patient presents with a cardiac arrest in the setting of a known cardiomyopathic process as suggested by remote diffuse T wave inversions and QT prolongation.  The trigger of this event and a myocardial context of this event remains to be  elucidated.  Hemodynamics are currently stable.  He was previously variable but now stabilizing blood pressures.  Echocardiogram pending.  We will continue to cycle troponin.  In this regard, the pseudonormalization of his T waves and intermittent presence of his right bundle raise first the question of whether this is the right patient is ECG and if so, it is labeled correctly, and the possibility is ischemia.  Continue IV heparin  We will discontinue IV amiodarone     For questions or updates, please contact [facility] Please consult [email or web address] for contact info under        Signed, Virl Axe, MD  06/29/2020, 8:58 AM

## 2020-06-29 NOTE — Progress Notes (Signed)
ANTICOAGULATION CONSULT NOTE   Pharmacy Consult for heparin Indication: r/o ACS s/p cardiac arrest  Allergies  Allergen Reactions  . Penicillins Rash    Patient Measurements: Height: 5\' 8"  (172.7 cm) Weight: 80.1 kg (176 lb 9.4 oz) IBW/kg (Calculated) : 68.4  Vital Signs: Temp: 96.6 F (35.9 C) (07/25 0900) Temp Source: Bladder (07/25 0530) BP: 104/81 (07/25 0900) Pulse Rate: 61 (07/25 0753)  Labs: Recent Labs    06/29/20 0009 06/29/20 0037 06/29/20 8921 06/29/20 0427 06/29/20 0427 06/29/20 0637 06/29/20 0805 06/29/20 0932  HGB 15.4   < >  --  17.3*   < > 15.9 16.7  --   HCT 49.6   < >  --  51.0  --  50.3 49.0  --   PLT 236  --   --   --   --  249  --   --   HEPARINUNFRC  --   --   --   --   --   --   --  0.15*  CREATININE 1.96*  --   --   --   --  1.87*  --   --   TROPONINIHS 344*  --  1,322*  --   --   --   --   --    < > = values in this interval not displayed.    Estimated Creatinine Clearance: 34 mL/min (A) (by C-G formula based on SCr of 1.87 mg/dL (H)).   Medical History: Past Medical History:  Diagnosis Date  . Colon polyp   . Hypertension   . 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 Migraine   . Mononucleosis 1990s  . Rash   . Shortness of breath ~1998   Pneumonia   . Skin moles    abnormal  . Wrist fracture    Childhood    Assessment: 74yo male had witnessed arrest at home, EMS found pt to be in Vfib and gave shocks, epi, and amiodarone with ROSC, now w/ troponin elevated, to begin heparin.  Heparin level came back subtherapeutic at 0.15, on 1000 units/hr. Hgb 15.9, plt 249 on last check. Trop up to 1322. No s/sx of bleeding or infusion issues.  Goal of Therapy:  Heparin level 0.3-0.7 units/ml Monitor platelets by anticoagulation protocol: Yes   Plan:  Will increase heparin infusion to 1200 units/hr  Order heparin level in 6 hours  Monitor HL, CBC, and for s/sx of bleeding  Antonietta Jewel, PharmD, [place] Pharmacist  Phone:  [phone] 06/29/2020 11:43 AM  Please check AMION for all [place] phone numbers After 10:00 PM, call [place] [phone]

## 2020-06-29 NOTE — ED Notes (Signed)
Paged Stemi for Dr. Betsey Holiday by Pollie Friar

## 2020-06-29 NOTE — ED Notes (Signed)
Critical Care @ bedside °

## 2020-06-29 NOTE — Progress Notes (Signed)
   06/29/20 0140  Clinical Encounter Type  Visited With Patient;Health care provider;Family  Visit Type Initial;Critical Care;ED;Spiritual support  Referral From Nurse  Stress Factors  Family Stress Factors Health changes   Chaplain responded to a post-CPR situation in the ED. Chaplain met with patient's daughter, ex-wife, and best friend. Chaplain offered prayer and helped transition family from the consultation room to the patient's room. Chaplain extended hospitality.Chaplain introduced spiritual care services. Spiritual care services available as needed.   Jeri Lager, Chaplain

## 2020-06-29 NOTE — Procedures (Signed)
Arterial Catheter Insertion Procedure Note  Christopher [name]  [id number]  [birth year]  Date:06/29/20  Time:5:20 AM    Provider Performing: Alvera Singh    Procedure: Insertion of Arterial Line [phone]) without US guidance  Indication(s) Blood pressure monitoring and/or need for frequent ABGs  Consent Risks of the procedure as well as the alternatives and risks of each were explained to the patient and/or caregiver.  Consent for the procedure was obtained and is signed in the bedside chart  Anesthesia None   Time Out Verified patient identification, verified procedure, site/side was marked, verified correct patient position, special equipment/implants available, medications/allergies/relevant history reviewed, required imaging and test results available.   Sterile Technique Maximal sterile technique including full sterile barrier drape, hand hygiene, sterile gown, sterile gloves, mask, hair covering, sterile ultrasound probe cover (if used).   Procedure Description Area of catheter insertion was cleaned with chlorhexidine and draped in sterile fashion. Without real-time ultrasound guidance an arterial catheter was placed into the right radial artery.  Appropriate arterial tracings confirmed on monitor.     Complications/Tolerance None; patient tolerated the procedure well.   EBL Minimal   Specimen(s) None

## 2020-06-29 NOTE — Progress Notes (Signed)
Patient transported from ED Trauma C to CT and back with no complications.

## 2020-06-29 NOTE — ED Notes (Signed)
Family bedside, RN answered questions.  Gave pt's necklace to daughter.

## 2020-06-30 ENCOUNTER — Other Ambulatory Visit: Payer: Self-pay

## 2020-06-30 ENCOUNTER — Encounter (HOSPITAL_COMMUNITY): Payer: Self-pay | Admitting: Pulmonary Disease

## 2020-06-30 ENCOUNTER — Inpatient Hospital Stay (HOSPITAL_COMMUNITY): Payer: Medicare Other

## 2020-06-30 DIAGNOSIS — R569 Unspecified convulsions: Secondary | ICD-10-CM

## 2020-06-30 DIAGNOSIS — I469 Cardiac arrest, cause unspecified: Secondary | ICD-10-CM

## 2020-06-30 LAB — CBC
HCT: 47.2 % (ref 39.0–52.0)
Hemoglobin: 14.9 g/dL (ref 13.0–17.0)
MCH: 28.7 pg (ref 26.0–34.0)
MCHC: 31.6 g/dL (ref 30.0–36.0)
MCV: 90.9 fL (ref 80.0–100.0)
Platelets: 199 10*3/uL (ref 150–400)
RBC: 5.19 MIL/uL (ref 4.22–5.81)
RDW: 13.9 % (ref 11.5–15.5)
WBC: 18.8 10*3/uL — ABNORMAL HIGH (ref 4.0–10.5)
nRBC: 0 % (ref 0.0–0.2)

## 2020-06-30 LAB — ECHOCARDIOGRAM COMPLETE
Area-P 1/2: 2.93 cm2
Height: 68 in
S' Lateral: 4.15 cm
Weight: 2825.42 oz

## 2020-06-30 LAB — BASIC METABOLIC PANEL
Anion gap: 10 (ref 5–15)
BUN: 37 mg/dL — ABNORMAL HIGH (ref 8–23)
CO2: 21 mmol/L — ABNORMAL LOW (ref 22–32)
Calcium: 8.4 mg/dL — ABNORMAL LOW (ref 8.9–10.3)
Chloride: 108 mmol/L (ref 98–111)
Creatinine, Ser: 2.4 mg/dL — ABNORMAL HIGH (ref 0.61–1.24)
GFR calc Af Amer: 30 mL/min — ABNORMAL LOW (ref >60)
GFR calc non Af Amer: 26 mL/min — ABNORMAL LOW (ref >60)
Glucose, Bld: 128 mg/dL — ABNORMAL HIGH (ref 70–99)
Potassium: 3.6 mmol/L (ref 3.5–5.1)
Sodium: 139 mmol/L (ref 135–145)

## 2020-06-30 LAB — PHOSPHORUS
Phosphorus: 4.9 mg/dL — ABNORMAL HIGH (ref 2.5–4.6)
Phosphorus: 4.2 mg/dL (ref 2.5–4.6)

## 2020-06-30 LAB — GLUCOSE, CAPILLARY
Glucose-Capillary: 106 mg/dL — ABNORMAL HIGH (ref 70–99)
Glucose-Capillary: 110 mg/dL — ABNORMAL HIGH (ref 70–99)
Glucose-Capillary: 113 mg/dL — ABNORMAL HIGH (ref 70–99)
Glucose-Capillary: 118 mg/dL — ABNORMAL HIGH (ref 70–99)
Glucose-Capillary: 129 mg/dL — ABNORMAL HIGH (ref 70–99)
Glucose-Capillary: 142 mg/dL — ABNORMAL HIGH (ref 70–99)

## 2020-06-30 LAB — MAGNESIUM
Magnesium: 2.3 mg/dL (ref 1.7–2.4)
Magnesium: 2.2 mg/dL (ref 1.7–2.4)

## 2020-06-30 LAB — HEPARIN LEVEL (UNFRACTIONATED): Heparin Unfractionated: 0.4 IU/mL (ref 0.30–0.70)

## 2020-06-30 MED ORDER — VITAL AF 1.2 CAL PO LIQD
1000.0000 mL | ORAL | Status: DC
  Administered 2020-06-30 – 2020-07-06 (×8): 1000 mL

## 2020-06-30 MED ORDER — ATORVASTATIN CALCIUM 10 MG PO TABS
20.0000 mg | ORAL_TABLET | Freq: Every day | ORAL | Status: DC
  Administered 2020-06-30 – 2020-07-16 (×17): 20 mg
  Filled 2020-06-30 (×18): qty 2

## 2020-06-30 MED ORDER — DEXMEDETOMIDINE HCL IN NACL 400 MCG/100ML IV SOLN
0.4000 ug/kg/h | INTRAVENOUS | Status: DC
  Administered 2020-06-30: 1.2 ug/kg/h via INTRAVENOUS
  Administered 2020-06-30: 0.4 ug/kg/h via INTRAVENOUS
  Administered 2020-07-01: 0.7 ug/kg/h via INTRAVENOUS
  Administered 2020-07-01: 0.8 ug/kg/h via INTRAVENOUS
  Administered 2020-07-01 – 2020-07-03 (×6): 0.7 ug/kg/h via INTRAVENOUS
  Administered 2020-07-04: 0.9 ug/kg/h via INTRAVENOUS
  Filled 2020-06-30: qty 100
  Filled 2020-06-30: qty 200
  Filled 2020-06-30 (×13): qty 100

## 2020-06-30 MED ORDER — POLYETHYLENE GLYCOL 3350 17 G PO PACK: 17.0000 g | PACK | Freq: Every day | ORAL | Status: DC | PRN

## 2020-06-30 MED ORDER — POLYETHYLENE GLYCOL 3350 17 G PO PACK
17.0000 g | PACK | Freq: Every day | ORAL | Status: DC
  Administered 2020-07-01 – 2020-07-14 (×4): 17 g
  Filled 2020-06-30 (×6): qty 1

## 2020-06-30 MED ORDER — ACETAMINOPHEN 160 MG/5ML PO SOLN
650.0000 mg | Freq: Four times a day (QID) | ORAL | Status: DC | PRN
  Administered 2020-07-01 – 2020-07-05 (×2): 650 mg

## 2020-06-30 MED ORDER — DOCUSATE SODIUM 50 MG/5ML PO LIQD
100.0000 mg | Freq: Two times a day (BID) | ORAL | Status: DC
  Administered 2020-06-30 – 2020-07-14 (×19): 100 mg
  Filled 2020-06-30 (×21): qty 10

## 2020-06-30 MED ORDER — PROSOURCE TF PO LIQD
45.0000 mL | Freq: Every day | ORAL | Status: DC
  Administered 2020-07-01 – 2020-07-07 (×7): 45 mL
  Filled 2020-06-30 (×7): qty 45

## 2020-06-30 MED ORDER — FENTANYL CITRATE (PF) 100 MCG/2ML IJ SOLN
25.0000 ug | INTRAMUSCULAR | Status: DC | PRN
  Administered 2020-06-30: 100 ug via INTRAVENOUS
  Administered 2020-06-30 (×2): 50 ug via INTRAVENOUS
  Administered 2020-06-30: 75 ug via INTRAVENOUS
  Administered 2020-06-30: 100 ug via INTRAVENOUS
  Administered 2020-06-30: 50 ug via INTRAVENOUS
  Administered 2020-06-30 – 2020-07-02 (×8): 100 ug via INTRAVENOUS
  Filled 2020-06-30 (×6): qty 2

## 2020-06-30 MED ORDER — HYDRALAZINE HCL 25 MG PO TABS
25.0000 mg | ORAL_TABLET | Freq: Three times a day (TID) | ORAL | Status: DC
  Administered 2020-06-30 – 2020-07-01 (×3): 25 mg
  Filled 2020-06-30 (×2): qty 1

## 2020-06-30 MED ORDER — POTASSIUM CHLORIDE 20 MEQ/15ML (10%) PO SOLN
20.0000 meq | Freq: Once | ORAL | Status: AC
  Administered 2020-06-30: 20 meq
  Filled 2020-06-30: qty 15

## 2020-06-30 MED ORDER — ASPIRIN 81 MG PO CHEW
81.0000 mg | CHEWABLE_TABLET | Freq: Every day | ORAL | Status: DC
  Administered 2020-06-30 – 2020-07-16 (×17): 81 mg
  Filled 2020-06-30 (×17): qty 1

## 2020-06-30 NOTE — Progress Notes (Addendum)
NAME:  Christopher [name], MRN:  [id number], DOB:  Nov 15, 1946, LOS: 1 ADMISSION DATE:  06/28/2020, CONSULTATION DATE:  06/30/20 REFERRING MD:  EDP, CHIEF COMPLAINT:  Cardiac arrest   Brief History   74 y.o. M with PMH of memory loss, HTN who had a witness arrest while sitting on the couch, 5 minutes down time without CPR.  EMS found patient in Vfib and pt was defibrillated 6 times and received 4 rounds EPI.  Intubated in the ED and PCCM consulted for admission  History of present illness   74 y.o. M with PMH of HTN, tobacco use and COPD who was in his usual state of health today, friend at the bedside said they ate dinner together and patient was well with no complaints.  Later was sitting on the couch and had a witnessed arrest, daughter called 911, approximately 5 minutes without CPR then EMS EMS found patient in Vfib and pt was defibrillated 6 times and received 4 rounds EPI.    Pt was Intubated in the ED, CT head was negative for acute findings, he was initially hypotensive and started on Levophed.  Blood pressure improved and pressors were stopped and he became hypotensive.  Patient was not given any sedation or induction medications for intubation, he did not show signs of responsiveness throughout ED course.  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   Significant Hospital Events   7/25 Admit to PCCM  Consults:  Cardiology  Procedures:  7/25 ETT >> Echo 7/26 >>  Significant Diagnostic Tests:  7/25 CXR>>Diffuse interstitial prominence, of uncertain acuity. This could reflect mild interstitial edema versus chronic interstitial lung disease. 7/25 CT head>> no acute findings, white matter disease, greatest in the left frontal lobe  Micro Data:  7/25 SARS-CoV-2>> negative  Antimicrobials:     Interim  history/subjective:   Off pressors Sedated on propofol and fentanyl Some posturing per RN. Being actively cooled on Arctic sun pads with goal normothermia Good urine output  Objective   Blood pressure (!) 139/58, pulse 105, temperature 98.8 F (37.1 C), temperature source Bladder, resp. rate 22, height _0  (1.727 m), weight 80.1 kg, SpO2 95 %.    Vent Mode: PRVC FiO2 (%):  [40 %-50 %] 40 % Set Rate:  [22 bmp] 22 bmp Vt Set:  [550 mL] 550 mL PEEP:  [5 cmH20-8 cmH20] 8 cmH20 Plateau Pressure:  [18 cmH20-28 cmH20] 28 cmH20   Intake/Output Summary (Last 24 hours) at 06/30/2020 3818 Last data filed at 06/30/2020 0900 Gross per 24 hour  Intake 1296.47 ml  Output 1240 ml  Net 56.47 ml   Filed Weights   06/29/20 0007 06/29/20 0521 06/30/20 0500  Weight: 81.6 kg 80.1 kg 80.1 kg   General:   Well-nourished male, intubated and nonresponsive HEENT: MM pink/moist ET tube in place Neuro: Unresponsive to pain, no spontaneous movement, pupils pinpoint nonreactive to light, sedated on propofol and fentanyl CV: s1s2 regular, no m/r/g PULM: Coarse breath sounds throughout GI: soft, bsx4 active  Extremities: warm/dry,  edema  Skin: no rashes or lesions , Arctic sun pads  Bedside echo 7/25:  LVH, LV normal size, LV systolic function reduced with EF 30% and lateral wall hypokinesis. Elevated left sided filling pressures. Mild AV calcification without stenosis. Mild MAC with mild regurgitation. Normal RV size and function. Was not able to calculate RVSP. IVC  dilated with no respiratory variation. RAP estimated at >20  Labs showed rising creatinine to 2.4, mild hypokalemia , stable leukocytosis  Chest x-ray shows prominent pulmonary artery as prior, no new infiltrates  Resolved Hospital Problem list     Assessment & Plan:   Critically ill due to Witnessed out of hospital V. fib cardiac arrest due to ischemic cardiomyopathy.  No further VF. -Anterolateral T wave inversion on admit EKG -  Stop amiodarone per cardiology  -Cardiac cath if recovers neurologically -Stop heparin after 48 hours Cardiogenic shock resolved  Acute encephalopathy,?  Post anoxic  - TTM to 37.5 -Change to Precedex and fentanyl as needed - Neuroprognostication at 96h  Acute decompensated systolic heart failure  Elevated filling pressures.  -Await official echo -Consider adding hydralazine and nitrates  Acute hypoxic respiratory failure - Daily SBT but mental status will preclude extubation.   Acute Kidney Injury Most recent creatinine 1.1 last month, now 2.4 , likely ATN postarrest -place foley, trend UOP and renal indices  COPD and tobacco abuse Current smoker -Scheduled duonebs  Best practice:  Diet: NPO -TFs Pain/Anxiety/Delirium protocol (if indicated): Precedex, goal RASS 0 to -1 VAP protocol (if indicated): HOB  DVT prophylaxis: Heparin GI prophylaxis: protonix Glucose control: SSI Mobility: bed rest Code Status: full code Family Communication: Daughter Disposition: ICU   The patient is critically ill with multiple organ systems failure and requires high complexity decision making for assessment and support, frequent evaluation and titration of therapies, application of advanced monitoring technologies and extensive interpretation of multiple databases. Critical Care Time devoted to patient care services described in this note independent of APP/resident  time is 35 minutes.    Kara Mead MD. [place] Flood. [facility]  If no response to pager , please call [phone]   06/30/2020

## 2020-06-30 NOTE — Progress Notes (Signed)
Initial Nutrition Assessment  DOCUMENTATION CODES:   Not applicable  INTERVENTION:   Tube Feeding:  Vital AF 1.2 at 60 ml/hr Pro-Source TF 45 mL daily Provides 1768 kcals, 108 g of protein and 1166 mL of free water Meets 100% estimated calorie and protein needs   NUTRITION DIAGNOSIS:   Inadequate oral intake related to acute illness as evidenced by NPO status.  GOAL:   Patient will meet greater than or equal to 90% of their needs  MONITOR:   Vent status, TF tolerance, Weight trends, Labs  REASON FOR ASSESSMENT:   Consult Enteral/tube feeding initiation and management  ASSESSMENT:   74 yo male admitted post witnessed cardiac arrest requiring intubation, NSTEMI, AKI. PMH includes HTN, tobacco abuse, COPD  TTM at 36/37 degrees Patient is currently intubated on ventilator support, sedated with precedex MV: 10.8 L/min Temp (24hrs), Avg:99.2 F (37.3 C), Min:98.1 F (36.7 C), Max:100.2 F (37.9 C)  Propofol: OFF  Adult TF protocol started yesterday and pt currently tolerating Vital High Protein at 40 ml/hr via OG tube  OG tip projects over gastric body per abd xray  Unable to obtain diet and weight history from patient at this time  Weight appears stable per weight encounters  Labs: phosphorus 4.9, Creatinine 2.40, BUN 37 Meds: ss novolog   NUTRITION - FOCUSED PHYSICAL EXAM:    Most Recent Value  Orbital Region No depletion  Upper Arm Region No depletion  Thoracic and Lumbar Region Unable to assess  Buccal Region Unable to assess  Temple Region No depletion  Clavicle Bone Region No depletion  Clavicle and Acromion Bone Region No depletion  Scapular Bone Region No depletion  Dorsal Hand No depletion  Patellar Region No depletion  Anterior Thigh Region No depletion  Posterior Calf Region Unable to assess       Diet Order:   Diet Order            Diet NPO time specified  Diet effective now                 EDUCATION NEEDS:   Not  appropriate for education at this time  Skin:  Skin Assessment: Reviewed RN Assessment  Last BM:  7/25  Height:   Ht Readings from Last 1 Encounters:  06/29/20 5\' 8"  (1.727 m)    Weight:   Wt Readings from Last 1 Encounters:  06/30/20 80.1 kg   BMI:  Body mass index is 26.85 kg/m.  Estimated Nutritional Needs:   Kcal:  1765 kcals  Protein:  96-128 g  Fluid:  >/= 1.8 L   Kerman Passey MS, RDN, LDN, CNSC Registered Dietitian III Clinical Nutrition RD Pager and On-Call Pager Number Located in [place]

## 2020-06-30 NOTE — Progress Notes (Signed)
Pt in and out of asymptomatic Afib. Rate controlled 70-80a, irregular. PA [facility] notified.

## 2020-06-30 NOTE — Procedures (Addendum)
Patient Name: ALEPH NICKSON  MRN: [id number]  Epilepsy Attending: Lora Havens  Referring Physician/Provider: Dr Kipp Brood Duration: 06/29/2020 1803 to 06/30/2020 1028  Patient history: 74yo M s/p cardiac arrest. EEG to evaluate for seizure  Level of alertness:  Lethargic/sedated  AEDs during EEG study: Propofol  Technical aspects: This EEG study was done with scalp electrodes positioned according to the 10-20 International system of electrode placement. Electrical activity was acquired at a sampling rate of 500Hz  and reviewed with a high frequency filter of 70Hz  and a low frequency filter of 1Hz . EEG data were recorded continuously and digitally stored.   Description: No clear posterior dominant rhythm was seen. EEG showed continuous generalized 3-6Hz  theta-delta slowing EEG was reactive to verbal stimularion.. Hyperventilation and photic stimulation were not performed.     ABNORMALITY -Continuous slow, generalized  IMPRESSION: This study is suggestive of severe diffuse encephalopathy, nonspecific etiology but likely related to sedation. No seizures or epileptiform discharges were seen throughout the recording.  Ponce Skillman Barbra Sarks

## 2020-06-30 NOTE — Progress Notes (Signed)
Oropharyngeal airway placed to act as bite block.

## 2020-06-30 NOTE — Progress Notes (Addendum)
Progress Note  Patient Name: Christopher [name] Date of Encounter: 06/30/2020  [facility] Cardiologist: Dr. Jolyn Nap  Subjective   Patient admitted 36 hours ago with witnessed sudden cardiac death secondary to VF.  Currently intubated and sedated and cooled with "normothermia".  Inpatient Medications    Scheduled Meds: . acetaminophen  650 mg Oral Q4H   Or  . acetaminophen (TYLENOL) oral liquid 160 mg/5 mL  650 mg Per Tube Q4H   Or  . acetaminophen  650 mg Rectal Q4H  . chlorhexidine gluconate (MEDLINE KIT)  15 mL Mouth Rinse BID  . Chlorhexidine Gluconate Cloth  6 each Topical Daily  . docusate  100 mg Oral BID  . feeding supplement (PROSource TF)  45 mL Per Tube BID  . feeding supplement (VITAL HIGH PROTEIN)  1,000 mL Per Tube Q24H  . insulin aspart  0-15 Units Subcutaneous Q4H  . ipratropium-albuterol  3 mL Nebulization Q6H  . mouth rinse  15 mL Mouth Rinse 10 times per day  . pantoprazole (PROTONIX) IV  40 mg Intravenous QHS  . polyethylene glycol  17 g Oral Daily   Continuous Infusions: . sodium chloride 40 mL/hr at 06/30/20 0700  . sodium chloride 250 mL (06/29/20 2124)  . fentaNYL infusion INTRAVENOUS 50 mcg/hr (06/30/20 0700)  . heparin 1,200 Units/hr (06/30/20 0700)  . phenylephrine (NEO-SYNEPHRINE) Adult infusion Stopped (06/30/20 0535)  . propofol (DIPRIVAN) infusion 12 mcg/kg/min (06/30/20 0700)   PRN Meds: Place/Maintain arterial line **AND** sodium chloride, acetaminophen, artificial tears, docusate sodium, fentaNYL (SUBLIMAZE) injection, fentaNYL (SUBLIMAZE) injection, labetalol, polyethylene glycol   Vital Signs    Vitals:   06/30/20 0630 06/30/20 0645 06/30/20 0700 06/30/20 0728  BP: (!) 112/50 (!) 118/51 (!) 139/58   Pulse:    105  Resp: '22 15 19 22  ' Temp:      TempSrc:      SpO2: 96% 95% 92% 95%  Weight:      Height:        Intake/Output Summary (Last 24 hours) at 06/30/2020 0753 Last data filed at 06/30/2020 0700 Gross per 24 hour    Intake 1449.73 ml  Output 1280 ml  Net 169.73 ml   Last 3 Weights 06/30/2020 06/29/2020 06/29/2020  Weight (lbs) 176 lb 9.4 oz 176 lb 9.4 oz 179 lb 14.3 oz  Weight (kg) 80.1 kg 80.1 kg 81.6 kg      Telemetry    Sinus rhythm- Personally Reviewed  ECG    None today, EKG yesterday shows sinus rhythm with anterolateral T wave inversion- Personally Reviewed  Physical Exam   GEN: No acute distress.   Neck: No JVD Cardiac: RRR, no murmurs, rubs, or gallops.  Respiratory: Clear to auscultation bilaterally. GI: Soft, nontender, non-distended  MS: No edema; No deformity. Neuro:  Intubated and sedated Psych: Normal affect   Labs    High Sensitivity Troponin:   Recent Labs  Lab 06/29/20 0009 06/29/20 0209  TROPONINIHS 344* 1,322*      Chemistry Recent Labs  Lab 06/29/20 0009 06/29/20 0037 06/29/20 0637 06/29/20 0805 06/30/20 0252  NA 142   < > 138 142 139  K 3.2*   < > 3.9 3.9 3.6  CL 108  --  108  --  108  CO2 22  --  18*  --  21*  GLUCOSE 221*  --  292*  --  128*  BUN 19  --  24*  --  37*  CREATININE 1.96*  --  1.87*  --  2.40*  CALCIUM 8.3*  --  7.5*  --  8.4*  PROT 6.1*  --   --   --   --   ALBUMIN 3.1*  --   --   --   --   AST 102*  --   --   --   --   ALT 101*  --   --   --   --   ALKPHOS 110  --   --   --   --   BILITOT 0.7  --   --   --   --   GFRNONAA 33*  --  35*  --  26*  GFRAA 38*  --  40*  --  30*  ANIONGAP 12  --  12  --  10   < > = values in this interval not displayed.     Hematology Recent Labs  Lab 06/29/20 0009 06/29/20 0037 06/29/20 0388 06/29/20 0805 06/30/20 0252  WBC 15.8*  --  18.2*  --  18.8*  RBC 5.33  --  5.44  --  5.19  HGB 15.4   < > 15.9 16.7 14.9  HCT 49.6   < > 50.3 49.0 47.2  MCV 93.1  --  92.5  --  90.9  MCH 28.9  --  29.2  --  28.7  MCHC 31.0  --  31.6  --  31.6  RDW 13.2  --  13.5  --  13.9  PLT 236  --  249  --  199   < > = values in this interval not displayed.    BNP Recent Labs  Lab 06/29/20 0009  BNP  412.0*     DDimer No results for input(s): DDIMER in the last 168 hours.   Radiology    DG Abdomen 1 View  Result Date: 06/29/2020 CLINICAL DATA:  Tube placement EXAM: ABDOMEN - 1 VIEW COMPARISON:  None. FINDINGS: The enteric tube tip projects over the gastric body. The visualized bowel gas pattern is unremarkable. IMPRESSION: Enteric tube tip projects over the gastric body. Electronically Signed   By: Constance Holster M.D.   On: 06/29/2020 00:31   CT HEAD WO CONTRAST  Result Date: 06/29/2020 CLINICAL DATA:  Mental status change with unknown cause. EXAM: CT HEAD WITHOUT CONTRAST TECHNIQUE: Contiguous axial images were obtained from the base of the skull through the vertex without intravenous contrast. COMPARISON:  MRI dated 01/13/2017 FINDINGS: Brain: No hemorrhage. No extraaxial collection.No midline shift. There is mild atrophy.The basal cisterns are unremarkable. Asymmetric relatively confluent white matter disease is noted, greatest in the left frontal lobe. This has progressed since the patient's prior MRI given differences and technique. 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 The brainstem is unremarkable. The cerebellum is unremarkable. The sella is unremarkable. Vascular: No hyperdense vessel or unexpected calcification. Skull: The calvarium is unremarkable. The skull base is unremarkable. The visualized upper cervical spine is unremarkable. Sinuses/Orbits: The visualized orbits are unremarkable. The paranasal sinuses are unremarkable. The mastoid air cells are clear. Other: The visualized parotid gland is unremarkable. There is no scalp soft tissue swelling. IMPRESSION: 1. No acute intracranial abnormality. 2. Asymmetric relatively confluent white matter disease, greatest in the left frontal lobe. This has progressed since the patient's prior MRI given differences and technique. This is nonspecific but can be seen in the setting of chronic microvascular ischemia. Electronically  Signed   By: Constance Holster M.D.   On: 06/29/2020 01:47   DG Chest Portable  1 View  Result Date: 06/29/2020 CLINICAL DATA:  Hypoxia, decreased breath sounds on left EXAM: PORTABLE CHEST 1 VIEW COMPARISON:  06/29/2020 at 12:11 a.m. FINDINGS: Single frontal view of the chest demonstrates stable endotracheal tube, enteric catheter, and external defibrillator pads. The cardiac silhouette is stable. There is persistent diffuse interstitial prominence. There is developing consolidation within the left lung base, which may reflect airspace disease or atelectasis. No effusion or pneumothorax. No acute bony abnormality. IMPRESSION: 1. Stable support devices. 2. Developing left basilar consolidation which may reflect airspace disease, aspiration, or atelectasis. Electronically Signed   By: Randa Ngo M.D.   On: 06/29/2020 03:15   DG Chest Portable 1 View  Result Date: 06/29/2020 CLINICAL DATA:  Intubated status post CPR EXAM: PORTABLE CHEST 1 VIEW COMPARISON:  04/02/2007 FINDINGS: Single frontal view of the chest demonstrates endotracheal tube overlying tracheal air column tip just below thoracic inlet. Enteric catheter passes below diaphragm tip excluded by collimation. Defibrillator pads overlie the left lower chest. Cardiac silhouette is unremarkable. There is diffuse interstitial prominence without airspace disease, effusion, or pneumothorax. No acute bony abnormalities. IMPRESSION: 1. Support devices as above. 2. Diffuse interstitial prominence, of uncertain acuity. This could reflect mild interstitial edema versus chronic interstitial lung disease. Electronically Signed   By: Randa Ngo M.D.   On: 06/29/2020 00:30   EEG adult  Result Date: 06/29/2020 Lora Havens, MD     06/29/2020  6:49 PM Patient Name: Christopher [name] MRN: [id number] Epilepsy Attending: Lora Havens Referring Physician/Provider: Elease Etienne, PA Date: 06/29/2020 Duration: 22.46 mins Patient history: 74yo M s/p cardiac  arrest. EEG to evaluate for seizure Level of alertness:  Lethargic/sedated AEDs during EEG study: Propofol Technical aspects: This EEG study was done with scalp electrodes positioned according to the 10-20 International system of electrode placement. Electrical activity was acquired at a sampling rate of '500Hz'  and reviewed with a high frequency filter of '70Hz'  and a low frequency filter of '1Hz' . EEG data were recorded continuously and digitally stored. Description: No clear posterior dominant rhythm was seen. EEG showed continuous generalized 6-'9hz' Hz theta-alpha activity. Hyperventilation and photic stimulation were not performed.   ABNORMALITY -Continuous slow, generalized IMPRESSION: This study is suggestive of severe diffuse encephalopathy, nonspecific etiology but likely related to sedation. No seizures or epileptiform discharges were seen throughout the recording. Lora Havens    Cardiac Studies   None  Patient Profile     74 y.o. male admitted 36 hours ago with sudden cardiac death.  This was witnessed.  The patient had 5 minutes without CPR and then had CPR and defibrillation x6.  He has been ventilated on pressors up until last night.  He has cooling pads on although he is normothermic.  Assessment & Plan    1.  Sudden cardiac death-witnessed sudden cardiac death 36 hours ago, intubated sedated and on cooling protocol with "normothermia".  Etiology unclear.  There is anterolateral T wave inversion suggesting an ischemic etiology.  Troponins only rose to 1300.  He is on IV heparin.  Await neurologic recovery to decide whether or not to pursue an invasive evaluation.  2D echo pending for LV function.  2: Right bundle branch block-transient  3: Diabetes-followed by primary care service  4: Ventricular fibrillation-no longer on IV amnio, no arrhythmias noted  5: Cardiogenic shock-blood pressures better off pressors  6: Anterolateral T wave inversion-question repolarization phenomenon  versus ischemically mediated.  If the patient has neurologic recovery will pursue invasive evaluation with diagnostic  coronary angiography.  7: Renal insufficiency-serum creatinine 2.4.  Unknown baseline.  Question related to ATN from hypotension shock.  We will continue to follow      For questions or updates, please contact [place] Please consult [email or web address] for contact info under        Signed, Quay Burow, MD  06/30/2020, 7:53 AM

## 2020-06-30 NOTE — Progress Notes (Signed)
Pt foley leaking, unsure of how much urine unaccounted for. Removed and replaced water in balloon. RN will continue to monitor.

## 2020-06-30 NOTE — Progress Notes (Signed)
[facility] Progress Note Patient Name: Christopher [name] DOB: 1946-05-13 MRN: [id number]   Date of Service  06/30/2020  HPI/Events of Note  Hypokalemia - K+ = 3.6 and Creatinine = 2.4.  eICU Interventions  Will cautiously replace K+.      Intervention Category Major Interventions: Electrolyte abnormality - evaluation and management  Nickholas Goldston [place] 06/30/2020, 5:06 AM

## 2020-06-30 NOTE — Progress Notes (Signed)
°  Echocardiogram 2D Echocardiogram has been performed.  Jennette Dubin 06/30/2020, 10:41 AM

## 2020-06-30 NOTE — Progress Notes (Signed)
LTM EEG discontinued - no skin breakdown at unhook.   

## 2020-06-30 NOTE — Progress Notes (Signed)
ANTICOAGULATION CONSULT NOTE   Pharmacy Consult for heparin Indication: r/o ACS s/p cardiac arrest  Allergies  Allergen Reactions  . Penicillins Rash    Patient Measurements: Height: 5\' 8"  (172.7 cm) Weight: 80.1 kg (176 lb 9.4 oz) IBW/kg (Calculated) : 68.4  Vital Signs: Temp: 98.8 F (37.1 C) (07/26 0400) Temp Source: Bladder (07/26 0400) BP: 139/58 (07/26 0700) Pulse Rate: 105 (07/26 0728)  Labs: Recent Labs    06/29/20 0009 06/29/20 0037 06/29/20 6045 06/29/20 0427 06/29/20 4098 06/29/20 1191 06/29/20 0805 06/29/20 0932 06/29/20 2000 06/30/20 0252  HGB 15.4   < >  --    < > 15.9   < > 16.7  --   --  14.9  HCT 49.6   < >  --    < > 50.3  --  49.0  --   --  47.2  PLT 236  --   --   --  249  --   --   --   --  199  HEPARINUNFRC  --   --   --   --   --   --   --  0.15* 0.35 0.40  CREATININE 1.96*  --   --   --  1.87*  --   --   --   --  2.40*  TROPONINIHS 344*  --  1,322*  --   --   --   --   --   --   --    < > = values in this interval not displayed.    Estimated Creatinine Clearance: 26.5 mL/min (A) (by C-G formula based on SCr of 2.4 mg/dL (H)).   Medical History: Past Medical History:  Diagnosis Date  . Colon polyp   . Hypertension   . 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 Migraine   . Mononucleosis 1990s  . Rash   . Shortness of breath ~1998   Pneumonia   . Skin moles    abnormal  . Wrist fracture    Childhood    Assessment: 74yo male had witnessed arrest at home, EMS found pt to be in Vfib and gave shocks, epi, and amiodarone with ROSC, now w/ troponin elevated, to begin heparin.  Heparin level at goal 0.4, on 1200 units/hr.  No s/sx of bleeding or infusion issues. CBC stable.   Goal of Therapy:  Heparin level 0.3-0.7 units/ml Monitor platelets by anticoagulation protocol: Yes   Plan:  Continue heparin at 1200 units/hr   Monitor HL, CBC, and for s/sx of bleeding  Erin Hearing PharmD., BCPS Clinical Pharmacist 06/30/2020 8:25 AM

## 2020-07-01 ENCOUNTER — Inpatient Hospital Stay (HOSPITAL_COMMUNITY): Payer: Medicare Other

## 2020-07-01 LAB — BASIC METABOLIC PANEL
Anion gap: 8 (ref 5–15)
BUN: 52 mg/dL — ABNORMAL HIGH (ref 8–23)
CO2: 20 mmol/L — ABNORMAL LOW (ref 22–32)
Calcium: 8.6 mg/dL — ABNORMAL LOW (ref 8.9–10.3)
Chloride: 111 mmol/L (ref 98–111)
Creatinine, Ser: 2.25 mg/dL — ABNORMAL HIGH (ref 0.61–1.24)
GFR calc Af Amer: 32 mL/min — ABNORMAL LOW (ref >60)
GFR calc non Af Amer: 28 mL/min — ABNORMAL LOW (ref >60)
Glucose, Bld: 134 mg/dL — ABNORMAL HIGH (ref 70–99)
Potassium: 4.2 mmol/L (ref 3.5–5.1)
Sodium: 139 mmol/L (ref 135–145)

## 2020-07-01 LAB — CBC
HCT: 45 % (ref 39.0–52.0)
Hemoglobin: 14.2 g/dL (ref 13.0–17.0)
MCH: 29.3 pg (ref 26.0–34.0)
MCHC: 31.6 g/dL (ref 30.0–36.0)
MCV: 92.8 fL (ref 80.0–100.0)
Platelets: 180 10*3/uL (ref 150–400)
RBC: 4.85 MIL/uL (ref 4.22–5.81)
RDW: 14.2 % (ref 11.5–15.5)
WBC: 14.9 10*3/uL — ABNORMAL HIGH (ref 4.0–10.5)
nRBC: 0 % (ref 0.0–0.2)

## 2020-07-01 LAB — HEPARIN LEVEL (UNFRACTIONATED)
Heparin Unfractionated: 0.27 IU/mL — ABNORMAL LOW (ref 0.30–0.70)
Heparin Unfractionated: 0.22 IU/mL — ABNORMAL LOW (ref 0.30–0.70)

## 2020-07-01 LAB — GLUCOSE, CAPILLARY
Glucose-Capillary: 120 mg/dL — ABNORMAL HIGH (ref 70–99)
Glucose-Capillary: 125 mg/dL — ABNORMAL HIGH (ref 70–99)
Glucose-Capillary: 135 mg/dL — ABNORMAL HIGH (ref 70–99)
Glucose-Capillary: 144 mg/dL — ABNORMAL HIGH (ref 70–99)
Glucose-Capillary: 144 mg/dL — ABNORMAL HIGH (ref 70–99)
Glucose-Capillary: 80 mg/dL (ref 70–99)

## 2020-07-01 LAB — MAGNESIUM: Magnesium: 2.3 mg/dL (ref 1.7–2.4)

## 2020-07-01 LAB — PHOSPHORUS: Phosphorus: 4.2 mg/dL (ref 2.5–4.6)

## 2020-07-01 MED ORDER — PANTOPRAZOLE SODIUM 40 MG PO PACK
40.0000 mg | PACK | Freq: Every day | ORAL | Status: DC
  Administered 2020-07-01 – 2020-07-16 (×16): 40 mg
  Filled 2020-07-01 (×18): qty 20

## 2020-07-01 MED ORDER — IPRATROPIUM-ALBUTEROL 0.5-2.5 (3) MG/3ML IN SOLN
3.0000 mL | Freq: Three times a day (TID) | RESPIRATORY_TRACT | Status: DC
  Administered 2020-07-01 – 2020-07-16 (×45): 3 mL via RESPIRATORY_TRACT
  Filled 2020-07-01 (×47): qty 3

## 2020-07-01 MED ORDER — FENTANYL 2500MCG IN NS 250ML (10MCG/ML) PREMIX INFUSION: 0.0000 ug/h | INTRAVENOUS | Status: DC

## 2020-07-01 MED ORDER — HYDRALAZINE HCL 50 MG PO TABS
50.0000 mg | ORAL_TABLET | Freq: Three times a day (TID) | ORAL | Status: DC
  Administered 2020-07-01 – 2020-07-07 (×18): 50 mg
  Filled 2020-07-01 (×17): qty 1

## 2020-07-01 MED ORDER — LABETALOL HCL 5 MG/ML IV SOLN
10.0000 mg | INTRAVENOUS | Status: DC | PRN
  Administered 2020-07-01: 20 mg via INTRAVENOUS
  Administered 2020-07-02 – 2020-07-03 (×3): 10 mg via INTRAVENOUS
  Administered 2020-07-04 – 2020-07-07 (×8): 20 mg via INTRAVENOUS
  Administered 2020-07-11: 10 mg via INTRAVENOUS
  Filled 2020-07-01 (×11): qty 4

## 2020-07-01 NOTE — Progress Notes (Signed)
NAME:  Christopher [name], MRN:  [id number], DOB:  1946/06/04, LOS: 2 ADMISSION DATE:  06/28/2020, CONSULTATION DATE:  07/01/20 REFERRING MD:  EDP, CHIEF COMPLAINT:  Cardiac arrest   Brief History   74 y.o. M with PMH of memory loss, HTN who had a witness arrest while sitting on the couch, 5 minutes down time without CPR.  EMS found patient in Vfib and pt was defibrillated 6 times and received 4 rounds EPI.  Intubated in the ED and PCCM consulted for admission  History of present illness   74 y.o. M with PMH of HTN, tobacco use and COPD who was in his usual state of health today, friend at the bedside said they ate dinner together and patient was well with no complaints.  Later was sitting on the couch and had a witnessed arrest, daughter called 911, approximately 5 minutes without CPR then EMS EMS found patient in Vfib and pt was defibrillated 6 times and received 4 rounds EPI.    Pt was Intubated in the ED, CT head was negative for acute findings, he was initially hypotensive and started on Levophed.  Blood pressure improved and pressors were stopped and he became hypotensive.  Patient was not given any sedation or induction medications for intubation, he did not show signs of responsiveness throughout ED course.  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   Significant Hospital Events   7/25 Admit to PCCM 7/26 Being actively cooled on Arctic sun pads with goal normothermia  Consults:  Cardiology  Procedures:  7/25 ETT >> Echo 7/26 >> EF 25 to 30%, global hypokinesis, reduced RV SF  Significant Diagnostic Tests:  7/25 CXR>>Diffuse interstitial prominence, of uncertain acuity. This could reflect mild interstitial edema versus chronic interstitial lung disease. 7/25 CT head>> no acute findings, white matter disease, greatest  in the left frontal lobe  Micro Data:  7/25 SARS-CoV-2>> negative  Antimicrobials:     Interim history/subjective:   Fentanyl added yesterday for breakthrough agitation. Posturing and biting down ET tube noted Urine output leaking and Foley replaced  Objective   Blood pressure (!) 107/52, pulse 62, temperature 98.6 F (37 C), temperature source Bladder, resp. rate 15, height 5\' 8"  (1.727 m), weight 80.8 kg, SpO2 96 %.    Vent Mode: PRVC FiO2 (%):  [40 %] 40 % Set Rate:  [22 bmp] 22 bmp Vt Set:  [550 mL] 550 mL PEEP:  [5 cmH20-8 cmH20] 5 cmH20 Plateau Pressure:  [18 cmH20-30 cmH20] 18 cmH20   Intake/Output Summary (Last 24 hours) at 07/01/2020 0815 Last data filed at 07/01/2020 0700 Gross per 24 hour  Intake 1771.78 ml  Output 625 ml  Net 1146.78 ml   Filed Weights   06/29/20 0521 06/30/20 0500 07/01/20 0500  Weight: 80.1 kg 80.1 kg 80.8 kg   General:   Well-nourished male, intubated and nonresponsive HEENT: MM pink/moist ET tube in place Neuro: Unresponsive to pain, no spontaneous movement, pupils pinpoint nonreactive to light, sedated on propofol and fentanyl, RASS -4 CV: s1s2 regular, no m/r/g PULM: Coarse breath sounds throughout GI: soft, bsx4 active  Extremities: warm/dry,  edema  Skin: no rashes or lesions , Arctic sun pads  Labs showed slight drop in creatinine to 2.2, normal electrolytes, decrease leukocytosis  Chest x-ray shows prominent pulmonary artery as prior, mild bibasilar atelectasis  Resolved Hospital Problem list  Assessment & Plan:   Critically ill due to Witnessed out of hospital V. fib cardiac arrest due to ischemic cardiomyopathy.  No further VF. -Anterolateral T wave inversion on admit EKG - Stop amiodarone per cardiology  -Cardiac cath if recovers neurologically  Atrial fibrillation -continue IV heparin  Acute encephalopathy,?  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   -Increase hydralazine 50 every 8  Acute hypoxic respiratory failure - Daily SBT but mental status will preclude extubation.   Acute Kidney Injury Most recent creatinine 1.1 last month, now  ATN postarrest -Creatinine plateaued and decreasing, expect to improve  COPD and tobacco abuse Current smoker -Scheduled duonebs  Best practice:  Diet: NPO -TFs Pain/Anxiety/Delirium protocol (if indicated): Precedex, goal RASS 0 to -1 VAP protocol (if indicated): HOB  DVT prophylaxis: Heparin GI prophylaxis: protonix Glucose control: SSI Mobility: bed rest Code Status: full code Family Communication: Sister and brother at bedside , guarded prognosis given for neurological recovery Disposition: ICU   The patient is critically ill with multiple organ systems failure and requires high complexity decision making for assessment and support, frequent evaluation and titration of therapies, application of advanced monitoring technologies and extensive interpretation of multiple databases. Critical Care Time devoted to patient care services described in this note independent of APP/resident  time is 32 minutes.    Kara Mead MD. [place] Flood. [facility]  If no response to pager , please call [phone]   07/01/2020

## 2020-07-01 NOTE — Progress Notes (Signed)
[facility] Progress Note Patient Name: Christopher [name] DOB: 14-Feb-1946 MRN: [id number]   Date of Service  07/01/2020  HPI/Events of Note  Agitation and ventilator asynchrony - Can't go up on Precedex IV infusion d/t HR = 60.   eICU Interventions  Plan: 1. Fentanyl IV infusion. Titrate to RASS = 0 to -1.     Intervention Category Major Interventions: Other:;Delirium, psychosis, severe agitation - evaluation and management  [name] 07/01/2020, 1:41 AM

## 2020-07-01 NOTE — Progress Notes (Signed)
ANTICOAGULATION CONSULT NOTE - Follow Up Consult  Pharmacy Consult for heparin Indication: ACS  Labs: Recent Labs    06/29/20 0009 06/29/20 0037 06/29/20 0209 06/29/20 0427 06/29/20 9326 06/29/20 7124 06/29/20 0805 06/29/20 0805 06/29/20 0932 06/29/20 2000 06/30/20 0252 07/01/20 0219  HGB 15.4   < >  --    < > 15.9   < > 16.7   < >  --   --  14.9 14.2  HCT 49.6   < >  --    < > 50.3   < > 49.0  --   --   --  47.2 45.0  PLT 236   < >  --   --  249  --   --   --   --   --  199 180  HEPARINUNFRC  --   --   --   --   --   --   --   --    < > 0.35 0.40 0.27*  CREATININE 1.96*  --   --   --  1.87*  --   --   --   --   --  2.40*  --   TROPONINIHS 344*  --  1,322*  --   --   --   --   --   --   --   --   --    < > = values in this interval not displayed.    Assessment: 74yo male subtherapeutic on heparin after two levels at lower end of goal; no gtt issues per RN but she does note some minor bleeding at gums.  Goal of Therapy:  Heparin level 0.3-0.7 units/ml   Plan:  Will increase heparin gtt by 1 unit/kg/hr to 1300 units/hr and check level in 8 hours.    Wynona Neat, PharmD, BCPS  07/01/2020,2:59 AM

## 2020-07-01 NOTE — Progress Notes (Signed)
Pt has triggered the vent alarms multiple times during his weaning period. The writer of this note provided pt with suctioning without much observed improvement. Vent settings flipped back to [facility] (previous mode). RT notified of this change. Ventilator compliance observed. Will continue to monitor.

## 2020-07-01 NOTE — Progress Notes (Signed)
ANTICOAGULATION CONSULT NOTE   Pharmacy Consult for heparin Indication: r/o ACS s/p cardiac arrest  Allergies  Allergen Reactions  . Penicillins Rash    Patient Measurements: Height: 5\' 8"  (172.7 cm) Weight: 80.8 kg (178 lb 2.1 oz) IBW/kg (Calculated) : 68.4  Vital Signs: Temp: 98 F (36.7 C) (07/27 0800) Temp Source: Bladder (07/27 0800) BP: 150/77 (07/27 1300) Pulse Rate: 99 (07/27 1110)  Labs: Recent Labs    06/29/20 0009 06/29/20 0037 06/29/20 4166 06/29/20 0427 06/29/20 0630 06/29/20 1601 06/29/20 0805 06/29/20 0932 06/30/20 0252 07/01/20 0219 07/01/20 1201  HGB 15.4   < >  --    < > 15.9   < > 16.7   < > 14.9 14.2  --   HCT 49.6   < >  --    < > 50.3   < > 49.0  --  47.2 45.0  --   PLT 236   < >  --   --  249  --   --   --  199 180  --   HEPARINUNFRC  --   --   --   --   --   --   --    < > 0.40 0.27* 0.22*  CREATININE 1.96*   < >  --   --  1.87*  --   --   --  2.40* 2.25*  --   TROPONINIHS 344*  --  1,322*  --   --   --   --   --   --   --   --    < > = values in this interval not displayed.    Estimated Creatinine Clearance: 28.3 mL/min (A) (by C-G formula based on SCr of 2.25 mg/dL (H)).   Medical History: Past Medical History:  Diagnosis Date  . Colon polyp   . Hypertension   . 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 Migraine   . Mononucleosis 1990s  . Rash   . Shortness of breath ~1998   Pneumonia   . Skin moles    abnormal  . Wrist fracture    Childhood    Assessment: 75yo male had witnessed arrest at home, EMS found pt to be in Vfib and gave shocks, epi, and amiodarone with ROSC, now w/ troponin elevated, to begin heparin.  Heparin level low this afternoon on 1300 units/hr.  No s/sx of bleeding or infusion issues. CBC stable.   Goal of Therapy:  Heparin level 0.3-0.7 units/ml Monitor platelets by anticoagulation protocol: Yes   Plan:  Increase heparin to 1500 units/hr   Recheck level tonight Monitor HL, CBC, and for s/sx of  bleeding  Erin Hearing PharmD., BCPS Clinical Pharmacist 07/01/2020 1:41 PM

## 2020-07-02 DIAGNOSIS — J9601 Acute respiratory failure with hypoxia: Secondary | ICD-10-CM | POA: Diagnosis not present

## 2020-07-02 DIAGNOSIS — I469 Cardiac arrest, cause unspecified: Secondary | ICD-10-CM | POA: Diagnosis not present

## 2020-07-02 LAB — HEPARIN LEVEL (UNFRACTIONATED)
Heparin Unfractionated: 0.2 IU/mL — ABNORMAL LOW (ref 0.30–0.70)
Heparin Unfractionated: 0.19 IU/mL — ABNORMAL LOW (ref 0.30–0.70)
Heparin Unfractionated: 0.43 IU/mL (ref 0.30–0.70)

## 2020-07-02 LAB — BASIC METABOLIC PANEL
Anion gap: 9 (ref 5–15)
BUN: 51 mg/dL — ABNORMAL HIGH (ref 8–23)
CO2: 23 mmol/L (ref 22–32)
Calcium: 8.7 mg/dL — ABNORMAL LOW (ref 8.9–10.3)
Chloride: 108 mmol/L (ref 98–111)
Creatinine, Ser: 1.87 mg/dL — ABNORMAL HIGH (ref 0.61–1.24)
GFR calc Af Amer: 40 mL/min — ABNORMAL LOW (ref >60)
GFR calc non Af Amer: 35 mL/min — ABNORMAL LOW (ref >60)
Glucose, Bld: 169 mg/dL — ABNORMAL HIGH (ref 70–99)
Potassium: 3.4 mmol/L — ABNORMAL LOW (ref 3.5–5.1)
Sodium: 140 mmol/L (ref 135–145)

## 2020-07-02 LAB — CBC
HCT: 39.6 % (ref 39.0–52.0)
Hemoglobin: 12.6 g/dL — ABNORMAL LOW (ref 13.0–17.0)
MCH: 29 pg (ref 26.0–34.0)
MCHC: 31.8 g/dL (ref 30.0–36.0)
MCV: 91 fL (ref 80.0–100.0)
Platelets: 181 10*3/uL (ref 150–400)
RBC: 4.35 MIL/uL (ref 4.22–5.81)
RDW: 14 % (ref 11.5–15.5)
WBC: 15.3 10*3/uL — ABNORMAL HIGH (ref 4.0–10.5)
nRBC: 0 % (ref 0.0–0.2)

## 2020-07-02 LAB — MAGNESIUM: Magnesium: 2 mg/dL (ref 1.7–2.4)

## 2020-07-02 LAB — GLUCOSE, CAPILLARY
Glucose-Capillary: 134 mg/dL — ABNORMAL HIGH (ref 70–99)
Glucose-Capillary: 127 mg/dL — ABNORMAL HIGH (ref 70–99)
Glucose-Capillary: 131 mg/dL — ABNORMAL HIGH (ref 70–99)
Glucose-Capillary: 125 mg/dL — ABNORMAL HIGH (ref 70–99)
Glucose-Capillary: 108 mg/dL — ABNORMAL HIGH (ref 70–99)

## 2020-07-02 MED ORDER — FENTANYL CITRATE (PF) 100 MCG/2ML IJ SOLN
25.0000 ug | INTRAMUSCULAR | Status: DC | PRN
  Administered 2020-07-02 – 2020-07-04 (×8): 100 ug via INTRAVENOUS
  Administered 2020-07-05: 50 ug via INTRAVENOUS
  Administered 2020-07-07 – 2020-07-08 (×4): 100 ug via INTRAVENOUS
  Filled 2020-07-02 (×17): qty 2

## 2020-07-02 MED ORDER — POTASSIUM CHLORIDE 20 MEQ/15ML (10%) PO SOLN
40.0000 meq | Freq: Once | ORAL | Status: AC
  Administered 2020-07-02: 40 meq
  Filled 2020-07-02: qty 30

## 2020-07-02 NOTE — Progress Notes (Signed)
NAME:  Christopher [name], MRN:  [id number], DOB:  03/31/1946, LOS: 3 ADMISSION DATE:  06/28/2020, CONSULTATION DATE:  07/02/20 REFERRING MD:  EDP, CHIEF COMPLAINT:  Cardiac arrest   Brief History   74 y.o. M with PMH of memory loss, HTN who had a witness arrest while sitting on the couch, 5 minutes down time without CPR.  EMS found patient in Vfib and pt was defibrillated 6 times and received 4 rounds EPI.  Intubated in the ED and PCCM consulted for admission  History of present illness   74 y.o. M with PMH of HTN, tobacco use and COPD who was in his usual state of health today, friend at the bedside said they ate dinner together and patient was well with no complaints.  Later was sitting on the couch and had a witnessed arrest, daughter called 911, approximately 5 minutes without CPR then EMS EMS found patient in Vfib and pt was defibrillated 6 times and received 4 rounds EPI.    Pt was Intubated in the ED, CT head was negative for acute findings, he was initially hypotensive and started on Levophed.  Blood pressure improved and pressors were stopped and he became hypotensive.  Patient was not given any sedation or induction medications for intubation, he did not show signs of responsiveness throughout ED course.  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   Significant Hospital Events   7/25 Admit to PCCM 7/26 Being actively cooled on Arctic sun pads with goal normothermia  Consults:  Cardiology  Procedures:  7/25 ETT >> Echo 7/26 >> EF 25 to 30%, global hypokinesis, reduced RV SF  Significant Diagnostic Tests:  7/25 CXR>>Diffuse interstitial prominence, of uncertain acuity. This could reflect mild interstitial edema versus chronic interstitial lung disease. 7/25 CT head>> no acute findings, white matter disease, greatest  in the left frontal lobe  Micro Data:  7/25 SARS-CoV-2>> negative  Antimicrobials:     Interim history/subjective:   7/28: no acute changes. Decerebrate posturing this am with painful stim. Remains on precedex alone. On cpap 10/5  Objective   Blood pressure (!) 176/94, pulse 93, temperature 99.3 F (37.4 C), temperature source Bladder, resp. rate (!) 28, height 5\' 8"  (1.727 m), weight 86.5 kg, SpO2 97 %.    Vent Mode: PRVC FiO2 (%):  [40 %] 40 % Set Rate:  [22 bmp] 22 bmp Vt Set:  [550 mL] 550 mL PEEP:  [5 cmH20] 5 cmH20 Pressure Support:  [5 cmH20] 5 cmH20 Plateau Pressure:  [21 cmH20] 21 cmH20   Intake/Output Summary (Last 24 hours) at 07/02/2020 0801 Last data filed at 07/02/2020 0700 Gross per 24 hour  Intake 1593.12 ml  Output 1525 ml  Net 68.12 ml   Filed Weights   06/30/20 0500 07/01/20 0500 07/02/20 0500  Weight: 80.1 kg 80.8 kg 86.5 kg   General:   Well-nourished male, intubated and nonresponsive HEENT: MM pink/moist ET tube in place Neuro: decerebrate to painful stim, no spontaneous movement, pupils pinpoint nonreactive to light, on precedex infusion CV: s1s2 regular, no m/r/g PULM: diminished bilaterally GI: soft, bsx4 active  Extremities: warm/dry, + edema  Skin: no rashes or lesions , Arctic sun pads in place  Lab Results  Component Value Date   WBC 15.3 (H) 07/02/2020   HGB 12.6 (L) 07/02/2020   HCT 39.6 07/02/2020   MCV 91.0 07/02/2020  PLT 181 07/02/2020   Lab Results  Component Value Date   CREATININE 1.87 (H) 07/02/2020   BUN 51 (H) 07/02/2020   NA 140 07/02/2020   K 3.4 (L) 07/02/2020   CL 108 07/02/2020   CO2 23 07/02/2020    Chest x-ray 7/27: shows prominent pulmonary artery as prior, mild bibasilar atelectasis, personally reviewed by me  Resolved Hospital Problem list      Assessment & Plan:   Critically ill due to Witnessed out of hospital V. fib cardiac arrest due to ischemic cardiomyopathy.  No further VF. -Anterolateral  T wave inversion on admit EKG Off amio -Cardiac cath if recovers neurologically -remains on heparin gtt per cardiology -ttm to maintain normothermia at this time.  Atrial fibrillation -continue IV heparin Acute decompensated combined systolic/diastolic hf -lvef 29-56%, grade 1 diastolic dysfunction, RV with mild reduction in function as well.  prolonged qt:  -on ekg from 7/26 -cont to follow, avoid prolonging agents as able -mag >2 K >4 goals  Acute encephalopathy,?  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  COPD and tobacco abuse Current smoker -Scheduled duonebs  Acute Kidney Injury Most recent creatinine 1.1 last month -improving indices Cr 1.8 today Hypokalemia:  -replace  Prediabetes:  -a1c 6.1 -follow bs Best practice:  Diet: tolerating tf Pain/Anxiety/Delirium protocol (if indicated): Precedex, goal RASS 0 to -1 VAP protocol (if indicated): HOB  DVT prophylaxis: Heparin GI prophylaxis: protonix Glucose control: SSI Mobility: bed rest Code Status: full code Family Communication: pending Disposition: ICU   Critical care time: The patient is critically ill with multiple organ systems failure and requires high complexity decision making for assessment and support, frequent evaluation and titration of therapies, application of advanced monitoring technologies and extensive interpretation of multiple databases.  Critical care time 39 mins. This represents my time independent of the NPs time taking care of the pt. This is excluding procedures.    [facility] 07/02/2020, 8:01 AM

## 2020-07-02 NOTE — Progress Notes (Signed)
ANTICOAGULATION CONSULT NOTE   Pharmacy Consult for heparin Indication: r/o ACS s/p cardiac arrest  Allergies  Allergen Reactions  . Penicillins Rash    Patient Measurements: Height: 5\' 8"  (172.7 cm) Weight: 86.5 kg (190 lb 11.2 oz) IBW/kg (Calculated) : 68.4  Vital Signs: Temp: 99.3 F (37.4 C) (07/28 0400) Temp Source: Bladder (07/28 0400) BP: 176/94 (07/28 0700) Pulse Rate: 93 (07/28 0418)  Labs: Recent Labs    06/30/20 0252 06/30/20 0252 07/01/20 0219 07/01/20 1201 07/02/20 0035  HGB 14.9   < > 14.2  --  12.6*  HCT 47.2  --  45.0  --  39.6  PLT 199  --  180  --  181  HEPARINUNFRC 0.40   < > 0.27* 0.22* 0.20*  CREATININE 2.40*  --  2.25*  --  1.87*   < > = values in this interval not displayed.    Estimated Creatinine Clearance: 37.6 mL/min (A) (by C-G formula based on SCr of 1.87 mg/dL (H)).   Medical History: Past Medical History:  Diagnosis Date  . Colon polyp   . Hypertension   . 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 Migraine   . Mononucleosis 1990s  . Rash   . Shortness of breath ~1998   Pneumonia   . Skin moles    abnormal  . Wrist fracture    Childhood    Assessment: 74yo male had witnessed arrest at home, EMS found pt to be in Vfib and gave shocks, epi, and amiodarone with ROSC, now w/ troponin elevated, to begin heparin.  Heparin level low this morning on 1700 units/hr after rate increased overnight.  No s/sx of bleeding or infusion issues. Hgb down this am 14.2>12.6, plt stable.   Goal of Therapy:  Heparin level 0.3-0.7 units/ml Monitor platelets by anticoagulation protocol: Yes   Plan:  Increase heparin to 1900 units/hr   Recheck level tonight Monitor HL, CBC, and for s/sx of bleeding  Erin Hearing PharmD., BCPS Clinical Pharmacist 07/02/2020 7:19 AM

## 2020-07-02 NOTE — Progress Notes (Signed)
ANTICOAGULATION CONSULT NOTE - Follow Up Consult  Pharmacy Consult for heparin Indication: atrial fibrillation  Labs: Recent Labs    06/29/20 0209 06/29/20 0427 06/30/20 0252 06/30/20 0252 07/01/20 0219 07/01/20 1201 07/02/20 0035  HGB  --    < > 14.9   < > 14.2  --  12.6*  HCT  --    < > 47.2  --  45.0  --  39.6  PLT  --    < > 199  --  180  --  181  HEPARINUNFRC  --    < > 0.40   < > 0.27* 0.22* 0.20*  CREATININE  --    < > 2.40*  --  2.25*  --  1.87*  TROPONINIHS 1,322*  --   --   --   --   --   --    < > = values in this interval not displayed.    Assessment: 74yo male remains subtherapeutic on heparin with lower level despite increased rate; no gtt issues per RN but she does note some minor bleeding at gums, no worse than yesterday, but Hgb has started to trend down, no other overt signs of bleeding.  Goal of Therapy:  Heparin level 0.3-0.7 units/ml   Plan:  Will increase heparin gtt by 2-3 units/kg/hr to 1700 units/hr and check level in 8 hours.    Wynona Neat, PharmD, BCPS  07/02/2020,1:53 AM

## 2020-07-02 NOTE — Progress Notes (Signed)
[facility] Progress Note Patient Name: Christopher [name] DOB: 1945-12-15 MRN: [id number]   Date of Service  07/02/2020  HPI/Events of Note  Notified of K 3.4, creatinine 1.87  eICU Interventions  Ordered K 40 per feeding tube Added mag level to labs     Intervention Category Major Interventions: Electrolyte abnormality - evaluation and management  Shona Needles Olive Zmuda 07/02/2020, 2:38 AM

## 2020-07-02 NOTE — Progress Notes (Addendum)
Per night shift RN report, pt with intermittent HTN overnight which correlated with tachypnea, posturing, and increase in HR.  BP only cycled hourly overnight.  This pattern in vital signs continued throughout the shift; pt marginally responsive to fentanyl pushes.  Brother and sister visiting at bedside; all questions answered. Concern for EEG paste in pt's hair; I called EEG who said this should dissolve with warm water, but it did not. Sister says she will address at home.  1700 HR maintaining in 50's; precedex down titrated Much better vent compliance, but only triggering ~20% of breaths  1800 Vent compliance continues; BP stable (no hypertensive episodes, no need for PRNs). Now triggering ~5% of breaths.  1900 Pt again hypertensive, tachypneic with stimulation during bedside handoff.

## 2020-07-02 NOTE — Progress Notes (Signed)
[facility] Progress Note Patient Name: Christopher [name] DOB: 05/10/1946 MRN: [id number]   Date of Service  07/02/2020  HPI/Events of Note  V fib arrest, decerbrating. Double stacking on Vent on Camera. Discussed with RN. VS stable. On Fent 25 to 100 q2 hr prn. Could have hypoxic brain injury-central triggering-chyne stokes   eICU Interventions  - increased fenta q1 hr prn. Can ignore asynchrony if vitals stable or give lesser dose of fenta for it.  - going for mRI.      Intervention Category Intermediate Interventions: Respiratory distress - evaluation and management  Elmer Sow 07/02/2020, 9:47 PM

## 2020-07-02 NOTE — Progress Notes (Signed)
[facility] for IV Heparin Indication: r/o ACS s/p cardiac arrest  Allergies  Allergen Reactions  . Penicillins Rash    Patient Measurements: Height: 5\' 8"  (172.7 cm) Weight: 86.5 kg (190 lb 11.2 oz) IBW/kg (Calculated) : 68.4  Vital Signs: Temp: 98.7 F (37.1 C) (07/28 1600) Temp Source: Rectal (07/28 1600) BP: 127/93 (07/28 1900) Pulse Rate: 80 (07/28 2011)  Labs: Recent Labs    06/30/20 0252 06/30/20 0252 07/01/20 0219 07/01/20 1201 07/02/20 0035 07/02/20 1023 07/02/20 1901  HGB 14.9   < > 14.2  --  12.6*  --   --   HCT 47.2  --  45.0  --  39.6  --   --   PLT 199  --  180  --  181  --   --   HEPARINUNFRC 0.40   < > 0.27*   < > 0.20* 0.19* 0.43  CREATININE 2.40*  --  2.25*  --  1.87*  --   --    < > = values in this interval not displayed.    Estimated Creatinine Clearance: 37.6 mL/min (A) (by C-G formula based on SCr of 1.87 mg/dL (H)).   Medical History: Past Medical History:  Diagnosis Date  . Colon polyp   . Hypertension   . 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 Migraine   . Mononucleosis 1990s  . Rash   . Shortness of breath ~1998   Pneumonia   . Skin moles    abnormal  . Wrist fracture    Childhood    Assessment: 74 yr old male had witnessed arrest at home, EMS found pt to be in Vfib and gave shocks, epi, and amiodarone with ROSC, now w/ troponin elevated, also with a fib, on IV heparin.  Heparin level ~6.5 hrs after heparin level was increased to 1900 units/hr was 0.43 units/ml, which is within the goal range for this pt. H/H 12.6/39.6, platelets 181. Per RN, no issues with IV or bleeding observed.  Goal of Therapy:  Heparin level 0.3-0.7 units/ml Monitor platelets by anticoagulation protocol: Yes   Plan:  Continue heparin infusion at 1900 units/hr   Check confirmatory heparin level in ~7 hrs Monitor daily heparin level, CBC Monitor for signs/symptoms of bleeding  Gillermina Hu, PharmD, BCPS, [facility] Clinical  Pharmacist 07/02/2020 8:41 PM

## 2020-07-03 ENCOUNTER — Inpatient Hospital Stay (HOSPITAL_COMMUNITY): Payer: Medicare Other

## 2020-07-03 DIAGNOSIS — I469 Cardiac arrest, cause unspecified: Secondary | ICD-10-CM | POA: Diagnosis not present

## 2020-07-03 LAB — COMPREHENSIVE METABOLIC PANEL
ALT: 74 U/L — ABNORMAL HIGH (ref 0–44)
AST: 58 U/L — ABNORMAL HIGH (ref 15–41)
Albumin: 2.4 g/dL — ABNORMAL LOW (ref 3.5–5.0)
Alkaline Phosphatase: 96 U/L (ref 38–126)
Anion gap: 11 (ref 5–15)
BUN: 54 mg/dL — ABNORMAL HIGH (ref 8–23)
CO2: 21 mmol/L — ABNORMAL LOW (ref 22–32)
Calcium: 9.1 mg/dL (ref 8.9–10.3)
Chloride: 111 mmol/L (ref 98–111)
Creatinine, Ser: 1.7 mg/dL — ABNORMAL HIGH (ref 0.61–1.24)
GFR calc Af Amer: 45 mL/min — ABNORMAL LOW (ref >60)
GFR calc non Af Amer: 39 mL/min — ABNORMAL LOW (ref >60)
Glucose, Bld: 138 mg/dL — ABNORMAL HIGH (ref 70–99)
Potassium: 4.5 mmol/L (ref 3.5–5.1)
Sodium: 143 mmol/L (ref 135–145)
Total Bilirubin: 0.9 mg/dL (ref 0.3–1.2)
Total Protein: 6 g/dL — ABNORMAL LOW (ref 6.5–8.1)

## 2020-07-03 LAB — CBC
HCT: 43.6 % (ref 39.0–52.0)
Hemoglobin: 14.1 g/dL (ref 13.0–17.0)
MCH: 29.8 pg (ref 26.0–34.0)
MCHC: 32.3 g/dL (ref 30.0–36.0)
MCV: 92.2 fL (ref 80.0–100.0)
Platelets: 215 10*3/uL (ref 150–400)
RBC: 4.73 MIL/uL (ref 4.22–5.81)
RDW: 14.6 % (ref 11.5–15.5)
WBC: 15.6 10*3/uL — ABNORMAL HIGH (ref 4.0–10.5)
nRBC: 0 % (ref 0.0–0.2)

## 2020-07-03 LAB — GLUCOSE, CAPILLARY
Glucose-Capillary: 112 mg/dL — ABNORMAL HIGH (ref 70–99)
Glucose-Capillary: 123 mg/dL — ABNORMAL HIGH (ref 70–99)
Glucose-Capillary: 129 mg/dL — ABNORMAL HIGH (ref 70–99)
Glucose-Capillary: 148 mg/dL — ABNORMAL HIGH (ref 70–99)
Glucose-Capillary: 151 mg/dL — ABNORMAL HIGH (ref 70–99)
Glucose-Capillary: 153 mg/dL — ABNORMAL HIGH (ref 70–99)
Glucose-Capillary: 161 mg/dL — ABNORMAL HIGH (ref 70–99)

## 2020-07-03 LAB — HEPARIN LEVEL (UNFRACTIONATED): Heparin Unfractionated: 0.11 IU/mL — ABNORMAL LOW (ref 0.30–0.70)

## 2020-07-03 MED ORDER — HEPARIN BOLUS VIA INFUSION
2000.0000 [IU] | Freq: Once | INTRAVENOUS | Status: AC
  Administered 2020-07-03: 2000 [IU] via INTRAVENOUS
  Filled 2020-07-03: qty 2000

## 2020-07-03 MED ORDER — HYDRALAZINE HCL 20 MG/ML IJ SOLN
INTRAMUSCULAR | Status: AC
  Administered 2020-07-03: 5 mg via INTRAVENOUS
  Filled 2020-07-03: qty 1

## 2020-07-03 MED ORDER — HEPARIN BOLUS VIA INFUSION
4000.0000 [IU] | Freq: Once | INTRAVENOUS | Status: AC
  Administered 2020-07-03: 4000 [IU] via INTRAVENOUS
  Filled 2020-07-03: qty 4000

## 2020-07-03 MED ORDER — HYDRALAZINE HCL 20 MG/ML IJ SOLN
5.0000 mg | INTRAMUSCULAR | Status: AC
  Administered 2020-07-03: 5 mg via INTRAVENOUS

## 2020-07-03 NOTE — Progress Notes (Signed)
Patient transported to MRI & back on the ventilator with no problems. ?

## 2020-07-03 NOTE — Progress Notes (Signed)
NAME:  Christopher [name], MRN:  [id number], DOB:  12-02-1946, LOS: 4 ADMISSION DATE:  06/28/2020, CONSULTATION DATE:  07/03/20 REFERRING MD:  EDP, CHIEF COMPLAINT:  Cardiac arrest   Brief History   74 y.o. M with PMH of memory loss, HTN who had a witness arrest while sitting on the couch, 5 minutes down time without CPR.  EMS found patient in Vfib and pt was defibrillated 6 times and received 4 rounds EPI.  Intubated in the ED and PCCM consulted for admission  History of present illness   74 y.o. M with PMH of HTN, tobacco use and COPD who was in his usual state of health today, friend at the bedside said they ate dinner together and patient was well with no complaints.  Later was sitting on the couch and had a witnessed arrest, daughter called 911, approximately 5 minutes without CPR then EMS EMS found patient in Vfib and pt was defibrillated 6 times and received 4 rounds EPI.    Pt was Intubated in the ED, CT head was negative for acute findings, he was initially hypotensive and started on Levophed.  Blood pressure improved and pressors were stopped and he became hypotensive.  Patient was not given any sedation or induction medications for intubation, he did not show signs of responsiveness throughout ED course.  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   Significant Hospital Events   7/25 Admit to PCCM 7/26 Being actively cooled on Arctic sun pads with goal normothermia  Consults:  Cardiology  Procedures:  7/25 ETT >> Echo 7/26 >> EF 25 to 30%, global hypokinesis, reduced RV SF  Significant Diagnostic Tests:  7/25 CXR>>Diffuse interstitial prominence, of uncertain acuity. This could reflect mild interstitial edema versus chronic interstitial lung disease. 7/25 CT head>> no acute findings, white matter disease, greatest  in the left frontal lobe 7/29 MRI brain:  Micro Data:  7/25 SARS-CoV-2>> negative  Antimicrobials:     Interim history/subjective:  7/29: no changes in exam. Awaiting MRI brain this morning. Pt without any response to painful stim today.  7/28: no acute changes. Decerebrate posturing this am with painful stim. Remains on precedex alone. On cpap 10/5  Objective   Blood pressure (!) 179/85, pulse 72, temperature 98.2 F (36.8 C), temperature source Bladder, resp. rate 21, height 5\' 8"  (1.727 m), weight 85.5 kg, SpO2 97 %.    Vent Mode: PRVC FiO2 (%):  [40 %] 40 % Set Rate:  [22 bmp] 22 bmp Vt Set:  [550 mL] 550 mL PEEP:  [5 cmH20] 5 cmH20 Pressure Support:  [10 cmH20] 10 cmH20 Plateau Pressure:  [19 cmH20] 19 cmH20   Intake/Output Summary (Last 24 hours) at 07/03/2020 0802 Last data filed at 07/03/2020 0700 Gross per 24 hour  Intake 2978.05 ml  Output 1615 ml  Net 1363.05 ml   Filed Weights   07/01/20 0500 07/02/20 0500 07/03/20 0500  Weight: 80.8 kg 86.5 kg 85.5 kg   General:   Well-nourished male, intubated and nonresponsive HEENT: ncat, eyes open spontaneously, not tracking, MM pink/moist ET tube in place Neuro: no response to painful stim today.  no spontaneous movement, pupils pinpoint nonreactive to light, on precedex infusion CV: s1s2 regular, no m/r/g PULM: diminished bilaterally but clear  GI: soft, bsx4 active  Extremities: warm/dry, + edema  Skin: no rashes or lesions , Arctic sun  pads in place  Lab Results  Component Value Date   WBC 15.6 (H) 07/03/2020   HGB 14.1 07/03/2020   HCT 43.6 07/03/2020   MCV 92.2 07/03/2020   PLT 215 07/03/2020   Lab Results  Component Value Date   CREATININE 1.70 (H) 07/03/2020   BUN 54 (H) 07/03/2020   NA 143 07/03/2020   K 4.5 07/03/2020   CL 111 07/03/2020   CO2 21 (L) 07/03/2020    No new imaging.   Resolved Hospital Problem list      Assessment & Plan:   Critically ill due to Witnessed out of hospital V.  fib cardiac arrest due to ischemic cardiomyopathy.  No further VF. -Anterolateral T wave inversion on admit EKG Off amio -Cardiac cath if recovers neurologically -remains on heparin gtt per cardiology -ok to remove arctic sun pads today.  Atrial fibrillation -continue IV heparin Acute decompensated combined systolic/diastolic hf -lvef 61-44%, grade 1 diastolic dysfunction, RV with mild reduction in function as well.  prolonged qt:  -on ekg from 7/26 -cont to follow, avoid prolonging agents as able -mag >2 K >4 goals  Acute encephalopathy,?  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  -Continue Precedex with goal RASS 0, use fentanyl intermittent for breakthrough agitation, DC drips as able to allow neuro prognostication    Acute hypoxic respiratory failure - Daily SBT but mental status will preclude extubation.  COPD and tobacco abuse Current smoker -Scheduled duonebs  Acute Kidney Injury Most recent creatinine 1.1 last month -slowly improving indices  Hypokalemia:  -resolved  Prediabetes:  -a1c 6.1 -follow bs Best practice:  Diet: tolerating tf Pain/Anxiety/Delirium protocol (if indicated): Precedex, goal RASS 0 to -1 VAP protocol (if indicated): HOB  DVT prophylaxis: Heparin GI prophylaxis: protonix Glucose control: SSI Mobility: bed rest Code Status: full code Family Communication: pending Disposition: ICU   Critical care time: The patient is critically ill with multiple organ systems failure and requires high complexity decision making for assessment and support, frequent evaluation and titration of therapies, application of advanced monitoring technologies and extensive interpretation of multiple databases.  Critical care time 37 mins. This represents my time independent of the NPs time taking care of the pt. This is excluding procedures.    [facility] 07/03/2020, 8:02 AM

## 2020-07-03 NOTE — Progress Notes (Signed)
Pt has continued to be dyssynchronous on vent throughout night- double stacking and causing peak pressure alarms to go off. HR has ranged from 54-108 with lower heart rates being associated with increased precedex gtt. Precedex had to be turned off for over an hour for heart rate to recover at one point during the night. BP has also been labile- higher BP consistent with increased amounts of dyssynchrony on vent. PRN fentanyl was utilized frequently throughout the night to help facilitate vent compliance. RN used precedex gtt when HR and BP allowed. RN also used two doses of hydralazine per ELink to control BP as well as PRN labetalol- htn was persistent despite the above interventions. RN relayed all concerns to [facility].   Patient does not follow commands or track. Pt has cough, though difficult to elicit, and no gag. Positive corneals, no blink to threat. Patient does not move extremities to pain. Pt decerebrate postures. Pt is scheduled to have MRI today.

## 2020-07-03 NOTE — Progress Notes (Addendum)
[facility] for IV Heparin Indication: r/o ACS s/p cardiac arrest  Allergies  Allergen Reactions  . Penicillins Rash    Patient Measurements: Height: 5\' 8"  (172.7 cm) Weight: 85.5 kg (188 lb 7.9 oz) IBW/kg (Calculated) : 68.4  Vital Signs: Temp: 98.2 F (36.8 C) (07/28 2345) Temp Source: Bladder (07/28 2345) BP: 179/85 (07/29 0700) Pulse Rate: 72 (07/29 0443)  Labs: Recent Labs    07/01/20 0219 07/01/20 1201 07/02/20 0035 07/02/20 0035 07/02/20 1023 07/02/20 1901 07/03/20 0418  HGB 14.2  --  12.6*  --   --   --  14.1  HCT 45.0  --  39.6  --   --   --  43.6  PLT 180  --  181  --   --   --  215  HEPARINUNFRC 0.27*   < > 0.20*   < > 0.19* 0.43 0.11*  CREATININE 2.25*  --  1.87*  --   --   --   --    < > = values in this interval not displayed.    Estimated Creatinine Clearance: 37.4 mL/min (A) (by C-G formula based on SCr of 1.87 mg/dL (H)).   Medical History: Past Medical History:  Diagnosis Date  . Colon polyp   . Hypertension   . 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 Migraine   . Mononucleosis 1990s  . Rash   . Shortness of breath ~1998   Pneumonia   . Skin moles    abnormal  . Wrist fracture    Childhood    Assessment: 74 yr old male had witnessed arrest at home, EMS found pt to be in Vfib and gave shocks, epi, and amiodarone with ROSC, now w/ troponin elevated, also with a fib, on IV heparin. -heparin level below gfoal   Goal of Therapy:  Heparin level 0.3-0.7 units/ml Monitor platelets by anticoagulation protocol: Yes   Plan:  Heparin bolus 1500 and increase infusion to 2100/hr Heparin level in 8 hours and daily wth CBC dail  Hildred Laser, PharmD Clinical Pharmacist **Pharmacist phone directory can now be found on [email or web address] ([email or web address]).  Listed under [place].

## 2020-07-03 NOTE — Progress Notes (Signed)
[place] for IV Heparin Indication: r/o ACS s/p cardiac arrest  Allergies  Allergen Reactions  . Penicillins Rash    Patient Measurements: Height: 5\' 8"  (172.7 cm) Weight: 85.5 kg (188 lb 7.9 oz) IBW/kg (Calculated) : 68.4  Vital Signs: Temp: 98.8 F (37.1 C) (07/29 1200) BP: 135/57 (07/29 1508) Pulse Rate: 72 (07/29 0443)  Labs: Recent Labs    07/01/20 0219 07/01/20 1201 07/02/20 0035 07/02/20 0035 07/02/20 1023 07/02/20 1901 07/03/20 0418 07/03/20 0615  HGB 14.2  --  12.6*  --   --   --  14.1  --   HCT 45.0  --  39.6  --   --   --  43.6  --   PLT 180  --  181  --   --   --  215  --   HEPARINUNFRC 0.27*   < > 0.20*   < > 0.19* 0.43 0.11*  --   CREATININE 2.25*  --  1.87*  --   --   --   --  1.70*   < > = values in this interval not displayed.    Estimated Creatinine Clearance: 41.2 mL/min (A) (by C-G formula based on SCr of 1.7 mg/dL (H)).   Medical History: Past Medical History:  Diagnosis Date  . Colon polyp   . Hypertension   . 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 Migraine   . Mononucleosis 1990s  . Rash   . Shortness of breath ~1998   Pneumonia   . Skin moles    abnormal  . Wrist fracture    Childhood    Assessment: 74 yr old male had witnessed arrest at home, EMS found pt to be in Vfib and gave shocks, epi, and amiodarone with ROSC, now w/ troponin elevated, also with a fib, on IV heparin. -heparin was adjusted to 1500 units/hr this am but the intention was to increase the infusion   Goal of Therapy:  Heparin level 0.3-0.7 units/ml Monitor platelets by anticoagulation protocol: Yes   Plan:  Heparin bolus 2000 unit x1 then increase to 2100 units/hr Heparin level in 8 hours and daily wth CBC daily  Hildred Laser, PharmD Clinical Pharmacist **Pharmacist phone directory can now be found on [email or web address] ([email or web address]).  Listed under [place].

## 2020-07-03 NOTE — Progress Notes (Signed)
Updated brother and sister at bedside that MRI was without definitive anoxic injury. D/w them options of continuing with care. Trach/peg nursing home if pt does not show neurologic recovery in the next week or so. Vs giving him the ~2 additional weeks and if he shows no sign of improvement, transitioning to comfort measures at that time as he would not find that quality of life acceptable.   They expressed understanding and have a lot to reflect upon. At this time we are continuing with care as is in hopes that he will make some neurologic recovery over the next 2 weeks or so.   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 At this time they would like to maintain full code but understand that another arrest would likely lessen his ability to return to his previous functional capacity.   All questions answered to best of my ability.  RN Rolla Plate, present at bedside as well.

## 2020-07-03 NOTE — Progress Notes (Signed)
[place] Progress Note Patient Name: Christopher [name] DOB: 1946-02-01 MRN: [id number]   Date of Service  07/03/2020  HPI/Events of Note  SBP > 160. HR 90. Earlier HR dropped to 50 while on precedex.  Has labetalol prn but hesitant to use it as per RN.  eICU Interventions  -hydralazine 5 mg prn x 2 for now. Cr 1.7     Intervention Category Intermediate Interventions: Hypertension - evaluation and management  Elmer Sow 07/03/2020, 1:00 AM

## 2020-07-03 NOTE — Progress Notes (Signed)
Per night shift RN report:  no neuro changes overnight RN suspicion for hypertension triggered by vent asynchrony MRI delayed until dayshift by respiratory therapist  Permissive bradycardia r/t precedex d/w CCM as long as BP maintained.  1000: arctic sun d/c'ed per CCM Family at bedside; d/w CCM r/t prognosis and upcoming decisions pending MRI results Family is not sure what the patient's wishes would have been. They understand that his daughter, Orson Slick, will need to either make the decision or defer to the pt's two living siblings.   1400: to MRI without incident  Much better vent synchrony with higher dose of precedex today  1800: decorticate posturing at turn  CCM updating family on inconclusive MRI; all questions answered. No changes made to plan of care at this time. Foley d/c'ed and condom cath placed. Due to void @ 0100.

## 2020-07-04 ENCOUNTER — Inpatient Hospital Stay (HOSPITAL_COMMUNITY): Payer: Medicare Other

## 2020-07-04 LAB — CBC
HCT: 40.5 % (ref 39.0–52.0)
Hemoglobin: 12.8 g/dL — ABNORMAL LOW (ref 13.0–17.0)
MCH: 28.8 pg (ref 26.0–34.0)
MCHC: 31.6 g/dL (ref 30.0–36.0)
MCV: 91 fL (ref 80.0–100.0)
Platelets: 208 10*3/uL (ref 150–400)
RBC: 4.45 MIL/uL (ref 4.22–5.81)
RDW: 14.7 % (ref 11.5–15.5)
WBC: 10.7 10*3/uL — ABNORMAL HIGH (ref 4.0–10.5)
nRBC: 0 % (ref 0.0–0.2)

## 2020-07-04 LAB — COMPREHENSIVE METABOLIC PANEL
ALT: 61 U/L — ABNORMAL HIGH (ref 0–44)
AST: 51 U/L — ABNORMAL HIGH (ref 15–41)
Albumin: 2.2 g/dL — ABNORMAL LOW (ref 3.5–5.0)
Alkaline Phosphatase: 95 U/L (ref 38–126)
Anion gap: 9 (ref 5–15)
BUN: 60 mg/dL — ABNORMAL HIGH (ref 8–23)
CO2: 22 mmol/L (ref 22–32)
Calcium: 9 mg/dL (ref 8.9–10.3)
Chloride: 112 mmol/L — ABNORMAL HIGH (ref 98–111)
Creatinine, Ser: 1.94 mg/dL — ABNORMAL HIGH (ref 0.61–1.24)
GFR calc Af Amer: 39 mL/min — ABNORMAL LOW (ref >60)
GFR calc non Af Amer: 33 mL/min — ABNORMAL LOW (ref >60)
Glucose, Bld: 170 mg/dL — ABNORMAL HIGH (ref 70–99)
Potassium: 3.4 mmol/L — ABNORMAL LOW (ref 3.5–5.1)
Sodium: 143 mmol/L (ref 135–145)
Total Bilirubin: 1 mg/dL (ref 0.3–1.2)
Total Protein: 5.8 g/dL — ABNORMAL LOW (ref 6.5–8.1)

## 2020-07-04 LAB — HEPARIN LEVEL (UNFRACTIONATED)
Heparin Unfractionated: 0.34 IU/mL (ref 0.30–0.70)
Heparin Unfractionated: 0.23 IU/mL — ABNORMAL LOW (ref 0.30–0.70)
Heparin Unfractionated: 0.59 IU/mL (ref 0.30–0.70)

## 2020-07-04 LAB — PROCALCITONIN: Procalcitonin: 1.44 ng/mL

## 2020-07-04 LAB — GLUCOSE, CAPILLARY
Glucose-Capillary: 126 mg/dL — ABNORMAL HIGH (ref 70–99)
Glucose-Capillary: 129 mg/dL — ABNORMAL HIGH (ref 70–99)
Glucose-Capillary: 130 mg/dL — ABNORMAL HIGH (ref 70–99)
Glucose-Capillary: 136 mg/dL — ABNORMAL HIGH (ref 70–99)
Glucose-Capillary: 155 mg/dL — ABNORMAL HIGH (ref 70–99)
Glucose-Capillary: 171 mg/dL — ABNORMAL HIGH (ref 70–99)

## 2020-07-04 MED ORDER — POTASSIUM CHLORIDE 20 MEQ/15ML (10%) PO SOLN
40.0000 meq | Freq: Once | ORAL | Status: AC
  Administered 2020-07-04: 40 meq
  Filled 2020-07-04: qty 30

## 2020-07-04 MED ORDER — SODIUM CHLORIDE 0.9 % IV SOLN
2.0000 g | Freq: Two times a day (BID) | INTRAVENOUS | Status: DC
  Administered 2020-07-04 – 2020-07-06 (×5): 2 g via INTRAVENOUS
  Filled 2020-07-04 (×6): qty 2

## 2020-07-04 MED ORDER — HEPARIN BOLUS VIA INFUSION
1000.0000 [IU] | Freq: Once | INTRAVENOUS | Status: AC
  Administered 2020-07-04: 1000 [IU] via INTRAVENOUS
  Filled 2020-07-04: qty 1000

## 2020-07-04 NOTE — Progress Notes (Signed)
[place] for IV Heparin Indication: r/o ACS s/p cardiac arrest  Allergies  Allergen Reactions  . Penicillins Rash    Patient Measurements: Height: 5\' 8"  (172.7 cm) Weight: 84.8 kg (186 lb 15.2 oz) IBW/kg (Calculated) : 68.4  Vital Signs: Temp: 99.9 F (37.7 C) (07/30 1900) Temp Source: Core (07/30 1600) BP: 141/50 (07/30 1900) Pulse Rate: 94 (07/30 1900)  Labs: Recent Labs    07/02/20 0035 07/02/20 1023 07/03/20 0418 07/03/20 0418 07/03/20 0615 07/04/20 0044 07/04/20 1007 07/04/20 1959  HGB 12.6*  --  14.1  --   --  12.8*  --   --   HCT 39.6  --  43.6  --   --  40.5  --   --   PLT 181  --  215  --   --  208  --   --   HEPARINUNFRC 0.20*   < > 0.11*   < >  --  0.34 0.23* 0.59  CREATININE 1.87*  --   --   --  1.70* 1.94*  --   --    < > = values in this interval not displayed.    Estimated Creatinine Clearance: 36 mL/min (A) (by C-G formula based on SCr of 1.94 mg/dL (H)).   Assessment: 74 yr old male had witnessed arrest at home, EMS found pt to be in Vfib and gave shocks, epi, and amiodarone with ROSC, now w/ troponin elevated, also with Afib, on IV heparin.  Heparin level therapeutic on heparin 2250 units/hr; no bleeding reported.  Goal of Therapy:  Heparin level 0.3-0.7 units/ml Monitor platelets by anticoagulation protocol: Yes   Plan:  Continue heparin gtt at 2250 units/hr F/U AM labs  Yukio Bisping D. Mina Marble, PharmD, BCPS, [place] 07/04/2020, 8:31 PM

## 2020-07-04 NOTE — Progress Notes (Signed)
[place] for IV Heparin Indication: r/o ACS s/p cardiac arrest  Allergies  Allergen Reactions  . Penicillins Rash    Patient Measurements: Height: 5\' 8"  (172.7 cm) Weight: 85.5 kg (188 lb 7.9 oz) IBW/kg (Calculated) : 68.4  Vital Signs: Temp: 100.3 F (37.9 C) (07/29 2318) Temp Source: Oral (07/29 2318) BP: 113/63 (07/30 0100) Pulse Rate: 57 (07/30 0100)  Labs: Recent Labs    07/01/20 0219 07/01/20 1201 07/02/20 0035 07/02/20 0035 07/02/20 1023 07/02/20 1901 07/03/20 0418 07/03/20 0615 07/04/20 0044  HGB 14.2  --  12.6*   < >  --   --  14.1  --  12.8*  HCT 45.0  --  39.6  --   --   --  43.6  --  40.5  PLT 180  --  181  --   --   --  215  --  208  HEPARINUNFRC 0.27*   < > 0.20*  --    < > 0.43 0.11*  --  0.34  CREATININE 2.25*  --  1.87*  --   --   --   --  1.70*  --    < > = values in this interval not displayed.    Estimated Creatinine Clearance: 41.2 mL/min (A) (by C-G formula based on SCr of 1.7 mg/dL (H)).   Medical History: Past Medical History:  Diagnosis Date  . Colon polyp   . Hypertension   . 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 Migraine   . Mononucleosis 1990s  . Rash   . Shortness of breath ~1998   Pneumonia   . Skin moles    abnormal  . Wrist fracture    Childhood    Assessment: 74 yr old male had witnessed arrest at home, EMS found pt to be in Vfib and gave shocks, epi, and amiodarone with ROSC, now w/ troponin elevated, also with a fib, on IV heparin.   7/30 AM update:  Heparin level therapeutic    Goal of Therapy:  Heparin level 0.3-0.7 units/ml Monitor platelets by anticoagulation protocol: Yes   Plan:  Cont heparin at 2100 units/hr 1000 heparin level  Narda Bonds, PharmD, BCPS Clinical Pharmacist Phone: [phone]

## 2020-07-04 NOTE — Progress Notes (Signed)
[facility] for IV Heparin Indication: r/o ACS s/p cardiac arrest  Allergies  Allergen Reactions  . Penicillins Rash    Patient Measurements: Height: 5\' 8"  (172.7 cm) Weight: 84.8 kg (186 lb 15.2 oz) IBW/kg (Calculated) : 68.4  Vital Signs: Temp: 99.5 F (37.5 C) (07/30 0700) Temp Source: Core (07/30 0800) BP: 110/64 (07/30 0727) Pulse Rate: 95 (07/30 0727)  Labs: Recent Labs    07/02/20 0035 07/02/20 1023 07/03/20 0418 07/03/20 0615 07/04/20 0044 07/04/20 1007  HGB 12.6*  --  14.1  --  12.8*  --   HCT 39.6  --  43.6  --  40.5  --   PLT 181  --  215  --  208  --   HEPARINUNFRC 0.20*   < > 0.11*  --  0.34 0.23*  CREATININE 1.87*  --   --  1.70* 1.94*  --    < > = values in this interval not displayed.    Estimated Creatinine Clearance: 36 mL/min (A) (by C-G formula based on SCr of 1.94 mg/dL (H)).   Medical History: Past Medical History:  Diagnosis Date  . Colon polyp   . Hypertension   . 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 Migraine   . Mononucleosis 1990s  . Rash   . Shortness of breath ~1998   Pneumonia   . Skin moles    abnormal  . Wrist fracture    Childhood    Assessment: 74 yr old male had witnessed arrest at home, EMS found pt to be in Vfib and gave shocks, epi, and amiodarone with ROSC, now w/ troponin elevated, also with a fib, on IV heparin. -heparin level= 0.23   Goal of Therapy:  Heparin level 0.3-0.7 units/ml Monitor platelets by anticoagulation protocol: Yes   Plan:  -heparin bolus 1000 units then increase heparin to 2150 units/hr -Heparin level in 8 hours and daily wth CBC daily   Hildred Laser, PharmD Clinical Pharmacist **Pharmacist phone directory can now be found on [email or web address] ([email or web address]).  Listed under [place].

## 2020-07-04 NOTE — Plan of Care (Signed)

## 2020-07-04 NOTE — Progress Notes (Signed)
NAME:  Christopher [name], MRN:  [id number], DOB:  09-Dec-1945, LOS: 5 ADMISSION DATE:  06/28/2020, CONSULTATION DATE:  07/04/20 REFERRING MD:  EDP, CHIEF COMPLAINT:  Cardiac arrest   Brief History   74 y.o. M with PMH of memory loss, HTN who had a witness arrest while sitting on the couch, 5 minutes down time without CPR.  EMS found patient in Vfib and pt was defibrillated 6 times and received 4 rounds EPI.  Intubated in the ED and PCCM consulted for admission  History of present illness   74 y.o. M with PMH of HTN, tobacco use and COPD who was in his usual state of health today, friend at the bedside said they ate dinner together and patient was well with no complaints.  Later was sitting on the couch and had a witnessed arrest, daughter called 911, approximately 5 minutes without CPR then EMS EMS found patient in Vfib and pt was defibrillated 6 times and received 4 rounds EPI.    Pt was Intubated in the ED, CT head was negative for acute findings, he was initially hypotensive and started on Levophed.  Blood pressure improved and pressors were stopped and he became hypotensive.  Patient was not given any sedation or induction medications for intubation, he did not show signs of responsiveness throughout ED course.  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   Significant Hospital Events   7/25 Admit to PCCM 7/26 Being actively cooled on Arctic sun pads with goal normothermia  Consults:  Cardiology  Procedures:  7/25 ETT >> Echo 7/26 >> EF 25 to 30%, global hypokinesis, reduced RV SF  Significant Diagnostic Tests:  7/25 CXR>>Diffuse interstitial prominence, of uncertain acuity. This could reflect mild interstitial edema versus chronic interstitial lung disease. 7/25 CT head>> no acute findings, white matter disease, greatest  in the left frontal lobe 7/29 MRI brain:  Micro Data:  7/25 SARS-CoV-2>> negative  Antimicrobials:     Interim history/subjective:  7/30: tmax 101.2 with thickened secretions today. Wbc is decreased will check pct and cxr. Check trach aspirate and start cefepime (mrsa swab negative) 7/29: no changes in exam. Awaiting MRI brain this morning. Pt without any response to painful stim today.  7/28: no acute changes. Decerebrate posturing this am with painful stim. Remains on precedex alone. On cpap 10/5  Objective   Blood pressure (!) 110/64, pulse 95, temperature 99.5 F (37.5 C), resp. rate (!) 26, height 5\' 8"  (1.727 m), weight 84.8 kg, SpO2 97 %.    Vent Mode: CPAP;PSV FiO2 (%):  [40 %] 40 % Set Rate:  [22 bmp] 22 bmp Vt Set:  [550 mL] 550 mL PEEP:  [5 cmH20] 5 cmH20 Pressure Support:  [8 cmH20-10 cmH20] 10 cmH20   Intake/Output Summary (Last 24 hours) at 07/04/2020 0759 Last data filed at 07/04/2020 0700 Gross per 24 hour  Intake 2752.24 ml  Output 780 ml  Net 1972.24 ml   Filed Weights   07/02/20 0500 07/03/20 0500 07/04/20 0448  Weight: 86.5 kg 85.5 kg 84.8 kg   General:   Well-nourished male, intubated and nonresponsive HEENT: ncat, eyes open spontaneously, not tracking, MM pink/moist ET tube in place Neuro: no response to painful stim today. + spontaneous movement but not purposeful, pupils reactive bilaterally, precedex held.  CV: s1s2 regular, no m/r/g PULM: diminished bilatearlly GI: soft, bsx4 active  Extremities: warm/dry, + edema  Skin: no rashes or lesions  Lab Results  Component Value Date   WBC 10.7 (H) 07/04/2020   HGB 12.8 (L) 07/04/2020   HCT 40.5 07/04/2020   MCV 91.0 07/04/2020   PLT 208 07/04/2020   Lab Results  Component Value Date   CREATININE 1.94 (H) 07/04/2020   BUN 60 (H) 07/04/2020   NA 143 07/04/2020   K 3.4 (L) 07/04/2020   CL 112 (H) 07/04/2020   CO2 22 07/04/2020    No new imaging.   Resolved Hospital Problem list       Assessment & Plan:   Critically ill due to Witnessed out of hospital V. fib cardiac arrest due to ischemic cardiomyopathy.  No further VF. -Anterolateral T wave inversion on admit EKG Off amio -Cardiac cath if recovers neurologically -remains on heparin gtt per cardiology  Atrial fibrillation -continue IV heparin Acute decompensated combined systolic/diastolic hf -lvef 34-19%, grade 1 diastolic dysfunction, RV with mild reduction in function as well.  prolonged qt:  -on ekg from 7/26 -cont to follow, avoid prolonging agents as able -mag >2 K >4 goals  Acute encephalopathy,?  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     Acute hypoxic respiratory failure - Daily SBT but mental status will preclude extubation.  COPD and tobacco abuse Current smoker -Scheduled duonebs Fever with thickened secretions:  -trach cx -cefepime to start -pct -follow fever and wbc -cxr pending.   Acute Kidney Injury Most recent creatinine 1.1 last month -worsening indices this am.  Hypokalemia:  -replacing  Prediabetes:  -a1c 6.1 -follow bs Best practice:  Diet: tolerating tf Pain/Anxiety/Delirium protocol (if indicated): Precedex, goal RASS 0 to -1 VAP protocol (if indicated): HOB  DVT prophylaxis: Heparin GI prophylaxis: protonix Glucose control: SSI Mobility: bed rest Code Status: full code Family Communication:daily updating sister and brother, pt has adult daughter who at this time is deferring decisions to them Disposition: ICU   Critical care time: The patient is critically ill with multiple organ systems failure and requires high complexity decision making for assessment and support, frequent evaluation and titration of therapies, application of advanced monitoring technologies and extensive interpretation of multiple databases.  Critical  care time 35 mins. This represents my time independent of the NPs time taking care of the pt. This is excluding procedures.    Audria Nine DO [facility] 07/04/2020, 7:59 AM

## 2020-07-04 NOTE — Plan of Care (Signed)
Goal: Will remain free from infection Outcome: Progressing Goal: Diagnostic test results will improve Outcome: Progressing Problem: Nutrition: Goal: Adequate nutrition will be maintained Outcome: Progressing  Problem: Elimination: Goal: Will not experience complications related to bowel motility Outcome: Progressing Goal: Will not experience complications related to urinary retention Outcome: Progressing

## 2020-07-05 DIAGNOSIS — I4901 Ventricular fibrillation: Principal | ICD-10-CM

## 2020-07-05 LAB — CBC
HCT: 40.9 % (ref 39.0–52.0)
Hemoglobin: 12.8 g/dL — ABNORMAL LOW (ref 13.0–17.0)
MCH: 29.4 pg (ref 26.0–34.0)
MCHC: 31.3 g/dL (ref 30.0–36.0)
MCV: 94 fL (ref 80.0–100.0)
Platelets: 222 10*3/uL (ref 150–400)
RBC: 4.35 MIL/uL (ref 4.22–5.81)
RDW: 15.3 % (ref 11.5–15.5)
WBC: 12.7 10*3/uL — ABNORMAL HIGH (ref 4.0–10.5)
nRBC: 0 % (ref 0.0–0.2)

## 2020-07-05 LAB — MAGNESIUM: Magnesium: 2 mg/dL (ref 1.7–2.4)

## 2020-07-05 LAB — GLUCOSE, CAPILLARY
Glucose-Capillary: 148 mg/dL — ABNORMAL HIGH (ref 70–99)
Glucose-Capillary: 141 mg/dL — ABNORMAL HIGH (ref 70–99)
Glucose-Capillary: 156 mg/dL — ABNORMAL HIGH (ref 70–99)
Glucose-Capillary: 126 mg/dL — ABNORMAL HIGH (ref 70–99)
Glucose-Capillary: 139 mg/dL — ABNORMAL HIGH (ref 70–99)

## 2020-07-05 LAB — COMPREHENSIVE METABOLIC PANEL
ALT: 75 U/L — ABNORMAL HIGH (ref 0–44)
AST: 64 U/L — ABNORMAL HIGH (ref 15–41)
Albumin: 2.1 g/dL — ABNORMAL LOW (ref 3.5–5.0)
Alkaline Phosphatase: 98 U/L (ref 38–126)
Anion gap: 10 (ref 5–15)
BUN: 56 mg/dL — ABNORMAL HIGH (ref 8–23)
CO2: 20 mmol/L — ABNORMAL LOW (ref 22–32)
Calcium: 9.2 mg/dL (ref 8.9–10.3)
Chloride: 115 mmol/L — ABNORMAL HIGH (ref 98–111)
Creatinine, Ser: 1.86 mg/dL — ABNORMAL HIGH (ref 0.61–1.24)
GFR calc Af Amer: 41 mL/min — ABNORMAL LOW (ref >60)
GFR calc non Af Amer: 35 mL/min — ABNORMAL LOW (ref >60)
Glucose, Bld: 177 mg/dL — ABNORMAL HIGH (ref 70–99)
Potassium: 3.7 mmol/L (ref 3.5–5.1)
Sodium: 145 mmol/L (ref 135–145)
Total Bilirubin: 0.7 mg/dL (ref 0.3–1.2)
Total Protein: 5.7 g/dL — ABNORMAL LOW (ref 6.5–8.1)

## 2020-07-05 LAB — PROCALCITONIN: Procalcitonin: 1.2 ng/mL

## 2020-07-05 LAB — HEPARIN LEVEL (UNFRACTIONATED): Heparin Unfractionated: 0.6 IU/mL (ref 0.30–0.70)

## 2020-07-05 MED ORDER — POTASSIUM CHLORIDE 20 MEQ/15ML (10%) PO SOLN
40.0000 meq | Freq: Once | ORAL | Status: AC
  Administered 2020-07-05: 40 meq
  Filled 2020-07-05: qty 30

## 2020-07-05 MED ORDER — ISOSORBIDE DINITRATE 10 MG PO TABS
30.0000 mg | ORAL_TABLET | Freq: Three times a day (TID) | ORAL | Status: DC
  Administered 2020-07-05 – 2020-07-06 (×6): 30 mg via ORAL
  Filled 2020-07-05 (×6): qty 3

## 2020-07-05 MED ORDER — LISINOPRIL 10 MG PO TABS
10.0000 mg | ORAL_TABLET | Freq: Every day | ORAL | Status: DC
  Administered 2020-07-05 – 2020-07-10 (×6): 10 mg
  Filled 2020-07-05 (×7): qty 1

## 2020-07-05 MED ORDER — DONEPEZIL HCL 5 MG PO TABS
5.0000 mg | ORAL_TABLET | Freq: Every day | ORAL | Status: DC
  Administered 2020-07-05 – 2020-07-15 (×11): 5 mg
  Filled 2020-07-05 (×13): qty 1

## 2020-07-05 MED ORDER — CARVEDILOL 12.5 MG PO TABS
12.5000 mg | ORAL_TABLET | Freq: Two times a day (BID) | ORAL | Status: DC
  Administered 2020-07-05 – 2020-07-08 (×6): 12.5 mg
  Filled 2020-07-05 (×6): qty 1

## 2020-07-05 MED ORDER — FUROSEMIDE 10 MG/ML IJ SOLN
80.0000 mg | Freq: Every day | INTRAMUSCULAR | Status: DC
  Administered 2020-07-05 – 2020-07-06 (×2): 80 mg via INTRAVENOUS
  Filled 2020-07-05 (×2): qty 8

## 2020-07-05 NOTE — Progress Notes (Signed)
NAME:  Christopher [name], MRN:  [id number], DOB:  [birth year], LOS: 6 ADMISSION DATE:  06/28/2020, CONSULTATION DATE:  07/05/20 REFERRING MD:  EDP, CHIEF COMPLAINT:  Cardiac arrest   Brief History   74 y.o. M with PMH of memory loss, HTN who had a witness arrest while sitting on the couch, 5 minutes down time without CPR.  EMS found patient in Vfib and pt was defibrillated 6 times and received 4 rounds EPI.  Intubated in the ED and PCCM consulted for admission  History of present illness   74 y.o. M with PMH of HTN, tobacco use and COPD who was in his usual state of health today, friend at the bedside said they ate dinner together and patient was well with no complaints.  Later was sitting on the couch and had a witnessed arrest, daughter called 911, approximately 5 minutes without CPR then EMS EMS found patient in Vfib and pt was defibrillated 6 times and received 4 rounds EPI.    Pt was Intubated in the ED, CT head was negative for acute findings, he was initially hypotensive and started on Levophed.  Blood pressure improved and pressors were stopped and he became hypotensive.  Patient was not given any sedation or induction medications for intubation, he did not show signs of responsiveness throughout ED course.  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   Significant Hospital Events   7/25 Admit to PCCM 7/26 Being actively cooled on Arctic sun pads with goal normothermia  Consults:  Cardiology  Procedures:  7/25 ETT >> Echo 7/26 >> EF 25 to 30%, global hypokinesis, reduced RV SF  Significant Diagnostic Tests:  7/25 CXR>>Diffuse interstitial prominence, of uncertain acuity. This could reflect mild interstitial edema versus chronic interstitial lung disease. 7/25 CT head>> no acute findings, white matter disease, greatest  in the left frontal lobe 7/29 MRI brain:  Micro Data:  7/25 SARS-CoV-2>> negative 7/30 Resp >> mixed flora. Antimicrobials:   Cefepime  Interim history/subjective:  Remains comatose.   Objective   Blood pressure (!) 186/77, pulse 86, temperature 99.3 F (37.4 C), resp. rate 23, height 5\' 8"  (1.727 m), weight 83.2 kg, SpO2 98 %.    Vent Mode: PRVC FiO2 (%):  [40 %] 40 % Set Rate:  [22 bmp] 22 bmp Vt Set:  [550 mL] 550 mL PEEP:  [5 cmH20] 5 cmH20 Pressure Support:  [10 cmH20] 10 cmH20 Plateau Pressure:  [17 cmH20-20 cmH20] 20 cmH20   Intake/Output Summary (Last 24 hours) at 07/05/2020 1404 Last data filed at 07/05/2020 1217 Gross per 24 hour  Intake 1930.21 ml  Output 1410 ml  Net 520.21 ml   Filed Weights   07/03/20 0500 07/04/20 0448 07/05/20 0442  Weight: 85.5 kg 84.8 kg 83.2 kg   General:   Well-nourished male, intubated and nonresponsive HEENT: ncat, eyes open spontaneously, not tracking, MM pink/moist ET tube in place Neuro: no response to painful stim today. Off all sedation.  CV: s1s2 regular, no m/r/g PULM: diminished bilatearlly GI: soft, bsx4 active  Extremities: warm/dry, ++ edema  Skin: no rashes or lesions  Lab Results  Component Value Date   WBC 12.7 (H) 07/05/2020   HGB 12.8 (L) 07/05/2020   HCT 40.9 07/05/2020   MCV 94.0 07/05/2020   PLT 222 07/05/2020   Lab Results  Component Value Date   CREATININE 1.86 (  H) 07/05/2020   BUN 56 (H) 07/05/2020   NA 145 07/05/2020   K 3.7 07/05/2020   CL 115 (H) 07/05/2020   CO2 20 (L) 07/05/2020    No new imaging.   Resolved Hospital Problem list      Assessment & Plan:   Critically ill due to Acute hypoxic respiratory failure - Daily SBT but mental status will preclude extubation.   Critically ill due to Witnessed out of hospital V. fib cardiac arrest due to ischemic cardiomyopathy.  No further VF. -Anterolateral T wave inversion on admit EKG Off amio -Cardiac cath if recovers  neurologically -remains on heparin gtt per cardiology  Atrial fibrillation -continue IV heparin  Acute decompensated combined systolic/diastolic hf - Diuresis.  - Increase GDHFT - start carvedilol, add ISDN, add ACEi  Acute encephalopathy,?  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   COPD and tobacco abuse Current smoker -Scheduled duonebs  Aspiration pneumonia suspected.   -trach cx -cefepime to start -pct -follow fever and wbc -cxr pending.   Acute Kidney Injury Most recent creatinine 1.1 last month  Hypokalemia:  -replacing  Prediabetes:  -a1c 6.1 -follow bs Best practice:  Diet: tolerating tf Pain/Anxiety/Delirium protocol (if indicated): Precedex, goal RASS 0 to -1 VAP protocol (if indicated): HOB  DVT prophylaxis: Heparin GI prophylaxis: protonix Glucose control: SSI Mobility: bed rest Code Status: full code Family Communication:daily updating sister and brother, pt has adult daughter who at this time is deferring decisions to them Disposition: ICU   CRITICAL CARE Performed by: Kipp Brood   Total critical care time: 35 minutes  Critical care time was exclusive of separately billable procedures and treating other patients.  Critical care was necessary to treat or prevent imminent or life-threatening deterioration.  Critical care was time spent personally by me on the following activities: development of treatment plan with patient and/or surrogate as well as nursing, discussions with consultants, evaluation of patient's response to treatment, examination of patient, obtaining history from patient or surrogate, ordering and performing treatments and interventions, ordering and review of laboratory studies, ordering and review of radiographic studies, pulse oximetry, re-evaluation of patient's condition and participation  in multidisciplinary rounds.  Kipp Brood, MD [facility] ICU Physician [place]  Pager: [phone] Mobile: [phone] After hours: [phone].   07/05/2020, 2:04 PM

## 2020-07-05 NOTE — Progress Notes (Signed)
[place] [place] for IV Heparin Indication: r/o ACS s/p cardiac arrest  Allergies  Allergen Reactions  . Penicillins Rash    Patient Measurements: Height: 5\' 8"  (172.7 cm) Weight: 83.2 kg (183 lb 6.8 oz) IBW/kg (Calculated) : 68.4  Vital Signs: Temp: 99.3 F (37.4 C) (07/31 0700) Temp Source: Esophageal (07/31 0400) BP: 175/61 (07/31 0700) Pulse Rate: 106 (07/31 0700)  Labs: Recent Labs    07/03/20 0418 07/03/20 0418 07/03/20 0615 07/04/20 0044 07/04/20 0044 07/04/20 1007 07/04/20 1959 07/05/20 0114  HGB 14.1   < >  --  12.8*  --   --   --  12.8*  HCT 43.6  --   --  40.5  --   --   --  40.9  PLT 215  --   --  208  --   --   --  222  HEPARINUNFRC 0.11*   < >  --  0.34   < > 0.23* 0.59 0.60  CREATININE  --   --  1.70* 1.94*  --   --   --  1.86*   < > = values in this interval not displayed.    Estimated Creatinine Clearance: 37.2 mL/min (A) (by C-G formula based on SCr of 1.86 mg/dL (H)).   Assessment: 74 yr old male had witnessed arrest at home, EMS found pt to be in Vfib and gave shocks, epi, and amiodarone with ROSC, now w/ troponin elevated, also with Afib, on IV heparin.  Heparin level continues to be therapeutic on heparin 2250 units/hr; no bleeding reported.  Goal of Therapy:  Heparin level 0.3-0.7 units/ml Monitor platelets by anticoagulation protocol: Yes   Plan:  Continue heparin gtt at 2250 units/hr F/U AM labs  Erin Hearing PharmD., BCPS Clinical Pharmacist 07/05/2020 7:43 AM

## 2020-07-05 NOTE — Progress Notes (Signed)
Progress Note  Patient Name: Christopher [name] Date of Encounter: 07/05/2020  Primary Cardiologist: No primary care provider on file.   Subjective   Remains intubated and sedated.   Inpatient Medications    Scheduled Meds: . acetaminophen (TYLENOL) oral liquid 160 mg/5 mL  650 mg Per Tube Q4H   Or  . acetaminophen  650 mg Rectal Q4H  . aspirin  81 mg Per Tube Daily  . atorvastatin  20 mg Per Tube Daily  . chlorhexidine gluconate (MEDLINE KIT)  15 mL Mouth Rinse BID  . Chlorhexidine Gluconate Cloth  6 each Topical Daily  . docusate  100 mg Per Tube BID  . feeding supplement (PROSource TF)  45 mL Per Tube Daily  . hydrALAZINE  50 mg Per Tube Q8H  . insulin aspart  0-15 Units Subcutaneous Q4H  . ipratropium-albuterol  3 mL Nebulization TID  . mouth rinse  15 mL Mouth Rinse 10 times per day  . pantoprazole sodium  40 mg Per Tube Q1200  . polyethylene glycol  17 g Per Tube Daily   Continuous Infusions: . sodium chloride Stopped (07/05/20 0650)  . sodium chloride 250 mL (06/29/20 2124)  . ceFEPime (MAXIPIME) IV Stopped (07/04/20 2312)  . feeding supplement (VITAL AF 1.2 CAL) 1,000 mL (07/04/20 2000)  . heparin 2,250 Units/hr (07/05/20 0700)   PRN Meds: Place/Maintain arterial line **AND** sodium chloride, acetaminophen, artificial tears, fentaNYL (SUBLIMAZE) injection, labetalol, polyethylene glycol   Vital Signs    Vitals:   07/05/20 0630 07/05/20 0700 07/05/20 0739 07/05/20 0743  BP: (!) 135/59 (!) 175/61    Pulse: 91 (!) 106    Resp: (!) 24 (!) 24    Temp: 99.3 F (37.4 C) 99.3 F (37.4 C)    TempSrc:      SpO2: 98% 99% 97% 97%  Weight:      Height:        Intake/Output Summary (Last 24 hours) at 07/05/2020 0845 Last data filed at 07/05/2020 0700 Gross per 24 hour  Intake 2317.21 ml  Output 1385 ml  Net 932.21 ml   Filed Weights   07/03/20 0500 07/04/20 0448 07/05/20 0442  Weight: 85.5 kg 84.8 kg 83.2 kg    Telemetry    NSR with PaC's - Personally  Reviewed  ECG    none - Personally Reviewed  Physical Exam   GEN: intubated  Neck: unable to assess JVD Cardiac: RRR, no murmurs, rubs, or gallops.  Respiratory: Clear to auscultation bilaterally. GI: Soft, nontender, non-distended  MS: No edema; No deformity. Neuro:  Nonfocal  Psych: Normal affect   Labs    Chemistry Recent Labs  Lab 07/03/20 0615 07/04/20 0044 07/05/20 0114  NA 143 143 145  K 4.5 3.4* 3.7  CL 111 112* 115*  CO2 21* 22 20*  GLUCOSE 138* 170* 177*  BUN 54* 60* 56*  CREATININE 1.70* 1.94* 1.86*  CALCIUM 9.1 9.0 9.2  PROT 6.0* 5.8* 5.7*  ALBUMIN 2.4* 2.2* 2.1*  AST 58* 51* 64*  ALT 74* 61* 75*  ALKPHOS 96 95 98  BILITOT 0.9 1.0 0.7  GFRNONAA 39* 33* 35*  GFRAA 45* 39* 41*  ANIONGAP '11 9 10     ' Hematology Recent Labs  Lab 07/03/20 0418 07/04/20 0044 07/05/20 0114  WBC 15.6* 10.7* 12.7*  RBC 4.73 4.45 4.35  HGB 14.1 12.8* 12.8*  HCT 43.6 40.5 40.9  MCV 92.2 91.0 94.0  MCH 29.8 28.8 29.4  MCHC 32.3 31.6 31.3  RDW 14.6  14.7 15.3  PLT 215 208 222    Cardiac EnzymesNo results for input(s): TROPONINI in the last 168 hours. No results for input(s): TROPIPOC in the last 168 hours.   BNP Recent Labs  Lab 06/29/20 0009  BNP 412.0*     DDimer No results for input(s): DDIMER in the last 168 hours.   Radiology    MR BRAIN WO CONTRAST  Result Date: 07/03/2020 CLINICAL DATA:  74 year old male status post cardiac arrest. Diffuse encephalopathy on EEG. Query anoxic injury. EXAM: MRI HEAD WITHOUT CONTRAST TECHNIQUE: Multiplanar, multiecho pulse sequences of the brain and surrounding structures were obtained without intravenous contrast. COMPARISON:  Brain MRI 01/13/2017.  Head CT 06/29/2020. FINDINGS: Brain: Inhomogeneous MRI imaging across multiple sequences, whereby signal in the anterior left frontal lobe appears artificially elevated. No convincing restricted diffusion. No convincing gyral edema. Deep gray nuclei appear stable compared to  2018, although with advanced chronic small vessel ischemia most pronounced in the right thalamus. Widely scattered cerebral white matter T2 and FLAIR hyperintensity is chronic but does appear progressed since 2018. Patchy signal changes in the pons appears stable along with a small right central pontine chronic lacune. No cortical encephalomalacia. No definite chronic cerebral blood products. No midline shift, mass effect, evidence of mass lesion, ventriculomegaly, extra-axial collection or acute intracranial hemorrhage. Cervicomedullary junction and pituitary are within normal limits. Vascular: Major intracranial vascular flow voids are stable since 2018. Mild intracranial artery ectasia. Skull and upper cervical spine: Stable visible cervical spine degeneration and heterogeneous marrow. Sinuses/Orbits: Orbits appears stable, negative. There is widespread fluid and/or mucosal thickening in the nasal cavity, visible pharynx, to a lesser extent paranasal sinuses and mastoids. Other: Negative scalp. IMPRESSION: No strong evidence of anoxic brain injury at this time. No acute intracranial abnormality identified. Advanced chronic small vessel disease appears mildly progressed since a 2018 MRI. Electronically Signed   By: Genevie Ann M.D.   On: 07/03/2020 15:49   DG CHEST PORT 1 VIEW  Result Date: 07/04/2020 CLINICAL DATA:  ET tube EXAM: PORTABLE CHEST 1 VIEW COMPARISON:  07/01/2020 FINDINGS: Endotracheal tube and NG tube in place. Endotracheal tube is 9 cm above the carina. Heart is normal size. Bilateral lower lobe airspace opacities. No effusions or pneumothorax. IMPRESSION: Endotracheal tube 9 cm above the carina. Bibasilar atelectasis or infiltrates. Electronically Signed   By: Rolm Baptise M.D.   On: 07/04/2020 09:56   DG Abd Portable 1V  Result Date: 07/04/2020 CLINICAL DATA:  OG tube placement. EXAM: PORTABLE ABDOMEN - 1 VIEW COMPARISON:  None. FINDINGS: Tip and side port of the enteric tube below the  diaphragm in the stomach. Mild gaseous gastric distension. Air throughout nondilated bowel loops in the abdomen. IMPRESSION: Tip and side port of the enteric tube below the diaphragm in the stomach. Electronically Signed   By: Keith Rake M.D.   On: 07/04/2020 17:51    Cardiac Studies   2D echo reviewed. Severe LV dysfunction  Patient Profile     74 y.o. male admitted with a VF arrest and now with severe anoxic encephalopathy remains with VDRF  Assessment & Plan    1. VF arrest - he has had no recurrent VF. On no AA drugs at this time.  2. Atrial fib - he is back in NSR with PAC's. We will follow.  3. VDRF - remains intubated. As per CCM. Mental status will make this difficult.  4. Anoxic encephalopathy - as per neuro service. As he is out a couple of  days, his prognosis is guarded     For questions or updates, please contact [place] Please consult [email or web address] for contact info under Cardiology/STEMI.      Signed, Cristopher Peru, MD  07/05/2020, 8:45 AM  Patient ID: Vita Barley, male   DOB: 11-26-1946, 74 y.o.   MRN: [id number]

## 2020-07-06 ENCOUNTER — Inpatient Hospital Stay (HOSPITAL_COMMUNITY): Payer: Medicare Other

## 2020-07-06 LAB — GLUCOSE, CAPILLARY
Glucose-Capillary: 122 mg/dL — ABNORMAL HIGH (ref 70–99)
Glucose-Capillary: 123 mg/dL — ABNORMAL HIGH (ref 70–99)
Glucose-Capillary: 142 mg/dL — ABNORMAL HIGH (ref 70–99)
Glucose-Capillary: 148 mg/dL — ABNORMAL HIGH (ref 70–99)
Glucose-Capillary: 155 mg/dL — ABNORMAL HIGH (ref 70–99)
Glucose-Capillary: 160 mg/dL — ABNORMAL HIGH (ref 70–99)

## 2020-07-06 LAB — CBC
HCT: 39.1 % (ref 39.0–52.0)
Hemoglobin: 12.1 g/dL — ABNORMAL LOW (ref 13.0–17.0)
MCH: 28.4 pg (ref 26.0–34.0)
MCHC: 30.9 g/dL (ref 30.0–36.0)
MCV: 91.8 fL (ref 80.0–100.0)
Platelets: 247 10*3/uL (ref 150–400)
RBC: 4.26 MIL/uL (ref 4.22–5.81)
RDW: 15.4 % (ref 11.5–15.5)
WBC: 14.4 10*3/uL — ABNORMAL HIGH (ref 4.0–10.5)
nRBC: 0 % (ref 0.0–0.2)

## 2020-07-06 LAB — CULTURE, RESPIRATORY W GRAM STAIN

## 2020-07-06 LAB — HEPARIN LEVEL (UNFRACTIONATED)
Heparin Unfractionated: 0.83 IU/mL — ABNORMAL HIGH (ref 0.30–0.70)
Heparin Unfractionated: 0.89 IU/mL — ABNORMAL HIGH (ref 0.30–0.70)

## 2020-07-06 LAB — COMPREHENSIVE METABOLIC PANEL
ALT: 77 U/L — ABNORMAL HIGH (ref 0–44)
AST: 52 U/L — ABNORMAL HIGH (ref 15–41)
Albumin: 2.1 g/dL — ABNORMAL LOW (ref 3.5–5.0)
Alkaline Phosphatase: 101 U/L (ref 38–126)
Anion gap: 11 (ref 5–15)
BUN: 58 mg/dL — ABNORMAL HIGH (ref 8–23)
CO2: 22 mmol/L (ref 22–32)
Calcium: 9.4 mg/dL (ref 8.9–10.3)
Chloride: 117 mmol/L — ABNORMAL HIGH (ref 98–111)
Creatinine, Ser: 1.79 mg/dL — ABNORMAL HIGH (ref 0.61–1.24)
GFR calc Af Amer: 43 mL/min — ABNORMAL LOW (ref >60)
GFR calc non Af Amer: 37 mL/min — ABNORMAL LOW (ref >60)
Glucose, Bld: 149 mg/dL — ABNORMAL HIGH (ref 70–99)
Potassium: 3.7 mmol/L (ref 3.5–5.1)
Sodium: 150 mmol/L — ABNORMAL HIGH (ref 135–145)
Total Bilirubin: 0.8 mg/dL (ref 0.3–1.2)
Total Protein: 5.6 g/dL — ABNORMAL LOW (ref 6.5–8.1)

## 2020-07-06 MED ORDER — FREE WATER
200.0000 mL | Freq: Four times a day (QID) | Status: DC
  Administered 2020-07-06 – 2020-07-07 (×4): 200 mL

## 2020-07-06 MED ORDER — ISOSORBIDE DINITRATE 10 MG PO TABS
30.0000 mg | ORAL_TABLET | Freq: Three times a day (TID) | ORAL | Status: DC
  Administered 2020-07-07 – 2020-07-16 (×26): 30 mg
  Filled 2020-07-06 (×27): qty 3

## 2020-07-06 MED ORDER — CHLORHEXIDINE GLUCONATE 0.12 % MT SOLN
OROMUCOSAL | Status: AC
  Administered 2020-07-06: 15 mL via OROMUCOSAL
  Filled 2020-07-06: qty 15

## 2020-07-06 MED ORDER — CHOLESTYRAMINE 4 G PO PACK
4.0000 g | PACK | Freq: Two times a day (BID) | ORAL | Status: DC
  Administered 2020-07-06 – 2020-07-10 (×9): 4 g
  Filled 2020-07-06 (×10): qty 1

## 2020-07-06 MED ORDER — POTASSIUM CHLORIDE 20 MEQ/15ML (10%) PO SOLN
40.0000 meq | Freq: Once | ORAL | Status: AC
  Administered 2020-07-06: 40 meq
  Filled 2020-07-06: qty 30

## 2020-07-06 MED ORDER — CEFAZOLIN SODIUM-DEXTROSE 2-4 GM/100ML-% IV SOLN
2.0000 g | Freq: Three times a day (TID) | INTRAVENOUS | Status: AC
  Administered 2020-07-06 – 2020-07-11 (×16): 2 g via INTRAVENOUS
  Filled 2020-07-06 (×17): qty 100

## 2020-07-06 NOTE — Progress Notes (Addendum)
NAME:  Christopher [name], MRN:  [id number], DOB:  05-Nov-1946, LOS: 7 ADMISSION DATE:  06/28/2020, CONSULTATION DATE:  07/06/20 REFERRING MD:  EDP, CHIEF COMPLAINT:  Cardiac arrest   Brief History   74 y.o. M with PMH of memory loss, HTN who had a witness arrest while sitting on the couch, 5 minutes down time without CPR.  EMS found patient in Vfib and pt was defibrillated 6 times and received 4 rounds EPI.  Intubated in the ED and PCCM consulted for admission  Pt was Intubated in the ED, CT head was negative for acute findings, he was initially hypotensive and started on Levophed.  Blood pressure improved and pressors were stopped and he became hypotensive.   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   Significant Hospital Events   7/25 Admit to PCCM 7/26 Being actively cooled on Arctic sun pads with goal normothermia 8/01 On vent, no change in mental status  Consults:  Cardiology  Procedures:  7/25 ETT >>  Significant Diagnostic Tests:  CT head 7/5 >> no acute findings, white matter disease, greatest in the left frontal lobe Echo 7/26 >> EF 25 to 30%, global hypokinesis, reduced RV SF MRI brain 7/29 >> no strong evidence of anoxic brain injury at this time, no acute intracranial abnormality, advanced chronic small vessel disease  Micro Data:  7/25 SARS-CoV-2 >> negative 7/30 Resp >> moderate klebsiella pneumoniae >> S-cefazolin   Antimicrobials:  Cefepime 7/30 >> 8/1  Cefazolin 8/1 >>   Interim history/subjective:  On vent, PSV Afebrile / WBC 14.4 I/O 2.4L UOP, +238 ml for last 24 hours  Glucose 139 - 177 Last dose fentanyl 50 mcg on 7/31 at 11pm  Objective   Blood pressure (!) 176/51, pulse 97, temperature 98.6 F (37 C), temperature source Esophageal, resp. rate 23, height 5\' 8"  (1.727 m), weight 82.7 kg,  SpO2 97 %.    Vent Mode: PSV;CPAP FiO2 (%):  [40 %] 40 % Set Rate:  [22 bmp] 22 bmp Vt Set:  [550 mL] 550 mL PEEP:  [5 cmH20] 5 cmH20 Pressure Support:  [10 cmH20-12 cmH20] 12 cmH20 Plateau Pressure:  [19 cmH20-20 cmH20] 20 cmH20   Intake/Output Summary (Last 24 hours) at 07/06/2020 1014 Last data filed at 07/06/2020 0925 Gross per 24 hour  Intake 2562.97 ml  Output 2625 ml  Net -62.03 ml   Filed Weights   07/04/20 0448 07/05/20 0442 07/06/20 0500  Weight: 84.8 kg 83.2 kg 82.7 kg   General: adult male lying in bed in NAD on vent    HEENT: MM pink/moist, ETT, oral bite block in place Neuro: unresponsive  CV: s1s2 rrr, no m/r/g PULM: non-labored on PSV, lungs bilaterally dimished  GI: soft, bsx4 active  Extremities: warm/dry, 1+ edema  Skin: no rashes or lesions  No CXR 8/1    Lab Results  Component Value Date   WBC 14.4 (H) 07/06/2020   HGB 12.1 (L) 07/06/2020   HCT 39.1 07/06/2020   MCV 91.8 07/06/2020   PLT 247 07/06/2020   Lab Results  Component Value Date   CREATININE 1.79 (H) 07/06/2020   BUN 58 (H) 07/06/2020   NA 150 (H) 07/06/2020   K 3.7 07/06/2020   CL 117 (H) 07/06/2020   CO2 22 07/06/2020      Resolved Hospital Problem list      Assessment &  Plan:   Acute Hypoxic Respiratory Failure in setting of Cardiac Arrest Klebsiella Pneumoniae PNA / Aspiration PNA -daily SBT but mental status barrier to extubation  -follow intermittent CXR  -narrow abx, add stop date for 7 days total  -follow PCT, fever curve, WBC   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  Post anoxic LTM EEG neg for seizures, MRI without evidence of anoxic injury.  Completed TTM protocol.  -minimize sedating medications to allow for neuro exam / prognostication  -PT efforts when able  -Consider Neurology consult in am if no improvement off sedation  -repeat EEG  COPD  Tobacco abuse -continue duonebs   Acute Kidney Injury Hypokalemia  -Trend BMP / urinary output -free water 200 ml Q6 -Replace electrolytes as indicated -Avoid nephrotoxic agents, ensure adequate renal perfusion  Prediabetes Hgb A1c 6.1 -follow glucose trend  -SSI, moderate scale   Best practice:  Diet: tolerating tf Pain/Anxiety/Delirium protocol (if indicated): Precedex, goal RASS 0 to -1 VAP protocol (if indicated): HOB  DVT prophylaxis: Heparin GI prophylaxis: protonix Glucose control: SSI Mobility: bed rest Code Status: full code Family Communication: No family present on am rounds 8/2, will update on arrival.  Disposition: ICU   CC Time: 67 minutes   Noe Gens, MSN, NP-C [facility] 07/06/2020, 10:15 AM   Please see [email or web address] for pager details.

## 2020-07-06 NOTE — Progress Notes (Signed)
EEG Completed; Results Pending  

## 2020-07-06 NOTE — Progress Notes (Signed)
[place] for IV Heparin Indication: r/o ACS s/p cardiac arrest, possible afib  Allergies  Allergen Reactions   Penicillins Rash    Patient Measurements: Height: 5\' 8"  (172.7 cm) Weight: 82.7 kg (182 lb 5.1 oz) IBW/kg (Calculated) : 68.4  Vital Signs: Temp: 98.6 F (37 C) (08/01 0800) Temp Source: Esophageal (08/01 0800) BP: 176/51 (08/01 0904) Pulse Rate: 97 (08/01 0857)  Labs: Recent Labs    07/04/20 0044 07/04/20 0044 07/04/20 1007 07/04/20 1959 07/05/20 0114 07/06/20 0138  HGB 12.8*   < >  --   --  12.8* 12.1*  HCT 40.5  --   --   --  40.9 39.1  PLT 208  --   --   --  222 247  HEPARINUNFRC 0.34  --    < > 0.59 0.60 0.83*  CREATININE 1.94*  --   --   --  1.86* 1.79*   < > = values in this interval not displayed.    Estimated Creatinine Clearance: 38.5 mL/min (A) (by C-G formula based on SCr of 1.79 mg/dL (H)).   Assessment: 74 yr old male had witnessed arrest at home, EMS found pt to be in Vfib and gave shocks, epi, and amiodarone with ROSC, now w/ troponin elevated, also with Afib, on IV heparin.  Heparin level elevated this morning on heparin 2250 units/hr; no bleeding reported. No bleeding noted, Currently NSR.  Goal of Therapy:  Heparin level 0.3-0.7 units/ml Monitor platelets by anticoagulation protocol: Yes   Plan:  Reduce heparin gtt to 2100 units/hr F/U AM labs  Erin Hearing PharmD., BCPS Clinical Pharmacist 07/06/2020 9:37 AM

## 2020-07-06 NOTE — Progress Notes (Signed)
Christopher [name] for IV Heparin Indication: r/o ACS s/p cardiac arrest, possible afib  Allergies  Allergen Reactions  . Penicillins Rash    Patient Measurements: Height: 5\' 8"  (172.7 cm) Weight: 82.7 kg (182 lb 5.1 oz) IBW/kg (Calculated) : 68.4  Vital Signs: Temp: 98.6 F (37 C) (08/01 1900) Temp Source: Esophageal (08/01 1600) BP: 164/47 (08/01 1900) Pulse Rate: 97 (08/01 1900)  Labs: Recent Labs    07/04/20 0044 07/04/20 1007 07/05/20 0114 07/06/20 0138 07/06/20 1833  HGB 12.8*  --  12.8* 12.1*  --   HCT 40.5  --  40.9 39.1  --   PLT 208  --  222 247  --   HEPARINUNFRC 0.34   < > 0.60 0.83* 0.89*  CREATININE 1.94*  --  1.86* 1.79*  --    < > = values in this interval not displayed.    Estimated Creatinine Clearance: 38.5 mL/min (A) (by C-G formula based on SCr of 1.79 mg/dL (H)).   Assessment: 74 yr old male had witnessed arrest at home, EMS found pt to be in Vfib and gave shocks, epi, and amiodarone with ROSC, now w/ troponin elevated, also with Afib, on IV heparin.  Repeat heparin level remains elevated despite rate adjustment.  Goal of Therapy:  Heparin level 0.3-0.7 units/ml Monitor platelets by anticoagulation protocol: Yes   Plan:  Reduce heparin gtt to 1900 units/hr Recheck with morning labs   Arrie Senate, PharmD, BCPS Clinical Pharmacist [phone] Please check AMION for all [facility] numbers 07/06/2020

## 2020-07-06 NOTE — Procedures (Signed)
Patient Name:Christopher [name] NGE:[id number] Epilepsy Attending:Ashleymarie Granderson Barbra Sarks Referring Physician/Provider: Noe Gens, NP Date: 07/06/2020 Duration:23.10 mins  Patient MWNUUVO:53GU M s/p cardiac arrest. EEG to evaluate for seizure  Level of alertness:comatose  AEDs during EEG study:None  Technical aspects: This EEG study was done with scalp electrodes positioned according to the 10-20 International system of electrode placement. Electrical activity was acquired at a sampling rate of 500Hz  and reviewed with a high frequency filter of 70Hz  and a low frequency filter of 1Hz . EEG data were recorded continuously and digitally stored.   Description:No clearposterior dominant rhythm was seen.EEG showed continuous generalized3-6Hz  theta-delta slowing EEG was reactive to verbal stimularion.Hyperventilation and photic stimulation were not performed.   ABNORMALITY -Continuousslow, generalized  IMPRESSION: This study is suggestive of severe diffuse encephalopathy, nonspecific etiology.No seizures or epileptiform discharges were seen throughout the recording.  EEG is similar to prior study performed on 06/30/2020  Jennene Downie Barbra Sarks

## 2020-07-06 NOTE — Progress Notes (Signed)
Progress Note  Patient Name: Christopher [name] Date of Encounter: 07/06/2020  Primary Cardiologist: No primary care provider on file.   Subjective   Remains intubated and sedated  Inpatient Medications    Scheduled Meds: . acetaminophen (TYLENOL) oral liquid 160 mg/5 mL  650 mg Per Tube Q4H   Or  . acetaminophen  650 mg Rectal Q4H  . aspirin  81 mg Per Tube Daily  . atorvastatin  20 mg Per Tube Daily  . carvedilol  12.5 mg Per Tube BID WC  . chlorhexidine gluconate (MEDLINE KIT)  15 mL Mouth Rinse BID  . Chlorhexidine Gluconate Cloth  6 each Topical Daily  . docusate  100 mg Per Tube BID  . donepezil  5 mg Per Tube QHS  . feeding supplement (PROSource TF)  45 mL Per Tube Daily  . furosemide  80 mg Intravenous Daily  . hydrALAZINE  50 mg Per Tube Q8H  . insulin aspart  0-15 Units Subcutaneous Q4H  . ipratropium-albuterol  3 mL Nebulization TID  . isosorbide dinitrate  30 mg Oral TID  . lisinopril  10 mg Per Tube Daily  . mouth rinse  15 mL Mouth Rinse 10 times per day  . pantoprazole sodium  40 mg Per Tube Q1200  . polyethylene glycol  17 g Per Tube Daily   Continuous Infusions: . sodium chloride 10 mL/hr at 07/06/20 0800  . sodium chloride 250 mL (06/29/20 2124)  . ceFEPime (MAXIPIME) IV Stopped (07/05/20 2224)  . feeding supplement (VITAL AF 1.2 CAL) 1,000 mL (07/06/20 0500)  . heparin 2,250 Units/hr (07/06/20 0800)   PRN Meds: Place/Maintain arterial line **AND** sodium chloride, acetaminophen, artificial tears, fentaNYL (SUBLIMAZE) injection, labetalol, polyethylene glycol   Vital Signs    Vitals:   07/06/20 0746 07/06/20 0752 07/06/20 0800 07/06/20 0857  BP:   (!) 156/78   Pulse:   (!) 113 97  Resp:   23   Temp:   98.6 F (37 C)   TempSrc:   Esophageal   SpO2: 97% 97% 97%   Weight:      Height:        Intake/Output Summary (Last 24 hours) at 07/06/2020 0901 Last data filed at 07/06/2020 0800 Gross per 24 hour  Intake 2622.97 ml  Output 2325 ml  Net  297.97 ml   Filed Weights   07/04/20 0448 07/05/20 0442 07/06/20 0500  Weight: 84.8 kg 83.2 kg 82.7 kg    Telemetry    nsr with PAC's and PVC's - Personally Reviewed  ECG    none - Personally Reviewed  Physical Exam   GEN: No acute distress.   Neck: 8 cm JVD Cardiac: IRRR, no murmurs, rubs, or gallops.  Respiratory: scatterd rales GI: Soft, nontender, non-distended  MS: No edema; No deformity. Neuro: sedated Psych: sedated  Labs    Chemistry Recent Labs  Lab 07/04/20 0044 07/05/20 0114 07/06/20 0138  NA 143 145 150*  K 3.4* 3.7 3.7  CL 112* 115* 117*  CO2 22 20* 22  GLUCOSE 170* 177* 149*  BUN 60* 56* 58*  CREATININE 1.94* 1.86* 1.79*  CALCIUM 9.0 9.2 9.4  PROT 5.8* 5.7* 5.6*  ALBUMIN 2.2* 2.1* 2.1*  AST 51* 64* 52*  ALT 61* 75* 77*  ALKPHOS 95 98 101  BILITOT 1.0 0.7 0.8  GFRNONAA 33* 35* 37*  GFRAA 39* 41* 43*  ANIONGAP '9 10 11     ' Hematology Recent Labs  Lab 07/04/20 0044 07/05/20 0114 07/06/20 0138  WBC 10.7* 12.7* 14.4*  RBC 4.45 4.35 4.26  HGB 12.8* 12.8* 12.1*  HCT 40.5 40.9 39.1  MCV 91.0 94.0 91.8  MCH 28.8 29.4 28.4  MCHC 31.6 31.3 30.9  RDW 14.7 15.3 15.4  PLT 208 222 247    Cardiac EnzymesNo results for input(s): TROPONINI in the last 168 hours. No results for input(s): TROPIPOC in the last 168 hours.   BNPNo results for input(s): BNP, PROBNP in the last 168 hours.   DDimer No results for input(s): DDIMER in the last 168 hours.   Radiology    DG CHEST PORT 1 VIEW  Result Date: 07/04/2020 CLINICAL DATA:  ET tube EXAM: PORTABLE CHEST 1 VIEW COMPARISON:  07/01/2020 FINDINGS: Endotracheal tube and NG tube in place. Endotracheal tube is 9 cm above the carina. Heart is normal size. Bilateral lower lobe airspace opacities. No effusions or pneumothorax. IMPRESSION: Endotracheal tube 9 cm above the carina. Bibasilar atelectasis or infiltrates. Electronically Signed   By: Rolm Baptise M.D.   On: 07/04/2020 09:56   DG Abd Portable  1V  Result Date: 07/04/2020 CLINICAL DATA:  OG tube placement. EXAM: PORTABLE ABDOMEN - 1 VIEW COMPARISON:  None. FINDINGS: Tip and side port of the enteric tube below the diaphragm in the stomach. Mild gaseous gastric distension. Air throughout nondilated bowel loops in the abdomen. IMPRESSION: Tip and side port of the enteric tube below the diaphragm in the stomach. Electronically Signed   By: Keith Rake M.D.   On: 07/04/2020 17:51    Cardiac Studies   none  Patient Profile     74 y.o. male admitted with a VF arrest, now with anoxic encephalopathy  Assessment & Plan    1. VF arrest - he has had no recurrent ventricular arrhythmias.  2. Atrial fib - he is maintaining NSR with PAC's and PVC's.  3. VDRF - remains intubated. Weaning trials as per CCM service 4. Anoxic encephalopathy - he did not respond with any purposeful movements on my stimulation. Prognosis guarded.      For questions or updates, please contact [place] Please consult [email or web address] for contact info under Cardiology/STEMI.      Signed, Cristopher Peru, MD  07/06/2020, 9:01 AM  Patient ID: Christopher [name], male   DOB: [birth year], 74 y.o.   MRN: [id number]

## 2020-07-07 ENCOUNTER — Inpatient Hospital Stay (HOSPITAL_COMMUNITY): Payer: Medicare Other

## 2020-07-07 DIAGNOSIS — G931 Anoxic brain damage, not elsewhere classified: Secondary | ICD-10-CM

## 2020-07-07 LAB — COMPREHENSIVE METABOLIC PANEL
ALT: 70 U/L — ABNORMAL HIGH (ref 0–44)
AST: 44 U/L — ABNORMAL HIGH (ref 15–41)
Albumin: 2.1 g/dL — ABNORMAL LOW (ref 3.5–5.0)
Alkaline Phosphatase: 98 U/L (ref 38–126)
Anion gap: 10 (ref 5–15)
BUN: 57 mg/dL — ABNORMAL HIGH (ref 8–23)
CO2: 24 mmol/L (ref 22–32)
Calcium: 9.6 mg/dL (ref 8.9–10.3)
Chloride: 117 mmol/L — ABNORMAL HIGH (ref 98–111)
Creatinine, Ser: 1.69 mg/dL — ABNORMAL HIGH (ref 0.61–1.24)
GFR calc Af Amer: 46 mL/min — ABNORMAL LOW (ref >60)
GFR calc non Af Amer: 39 mL/min — ABNORMAL LOW (ref >60)
Glucose, Bld: 141 mg/dL — ABNORMAL HIGH (ref 70–99)
Potassium: 3.7 mmol/L (ref 3.5–5.1)
Sodium: 151 mmol/L — ABNORMAL HIGH (ref 135–145)
Total Bilirubin: 0.5 mg/dL (ref 0.3–1.2)
Total Protein: 5.8 g/dL — ABNORMAL LOW (ref 6.5–8.1)

## 2020-07-07 LAB — CBC
HCT: 37.8 % — ABNORMAL LOW (ref 39.0–52.0)
Hemoglobin: 11.6 g/dL — ABNORMAL LOW (ref 13.0–17.0)
MCH: 28.4 pg (ref 26.0–34.0)
MCHC: 30.7 g/dL (ref 30.0–36.0)
MCV: 92.6 fL (ref 80.0–100.0)
Platelets: 288 10*3/uL (ref 150–400)
RBC: 4.08 MIL/uL — ABNORMAL LOW (ref 4.22–5.81)
RDW: 15.2 % (ref 11.5–15.5)
WBC: 14.5 10*3/uL — ABNORMAL HIGH (ref 4.0–10.5)
nRBC: 0 % (ref 0.0–0.2)

## 2020-07-07 LAB — GLUCOSE, CAPILLARY
Glucose-Capillary: 121 mg/dL — ABNORMAL HIGH (ref 70–99)
Glucose-Capillary: 126 mg/dL — ABNORMAL HIGH (ref 70–99)
Glucose-Capillary: 129 mg/dL — ABNORMAL HIGH (ref 70–99)
Glucose-Capillary: 129 mg/dL — ABNORMAL HIGH (ref 70–99)
Glucose-Capillary: 135 mg/dL — ABNORMAL HIGH (ref 70–99)
Glucose-Capillary: 138 mg/dL — ABNORMAL HIGH (ref 70–99)
Glucose-Capillary: 148 mg/dL — ABNORMAL HIGH (ref 70–99)

## 2020-07-07 LAB — HEPARIN LEVEL (UNFRACTIONATED)
Heparin Unfractionated: 0.77 IU/mL — ABNORMAL HIGH (ref 0.30–0.70)
Heparin Unfractionated: 0.57 IU/mL (ref 0.30–0.70)

## 2020-07-07 MED ORDER — PNEUMOCOCCAL VAC POLYVALENT 25 MCG/0.5ML IJ INJ: 0.5000 mL | INJECTION | INTRAMUSCULAR | Status: DC | PRN

## 2020-07-07 MED ORDER — FREE WATER
400.0000 mL | Freq: Four times a day (QID) | Status: DC
  Administered 2020-07-07 – 2020-07-10 (×12): 400 mL

## 2020-07-07 MED ORDER — POTASSIUM CHLORIDE 20 MEQ/15ML (10%) PO SOLN
40.0000 meq | Freq: Once | ORAL | Status: AC
  Administered 2020-07-07: 40 meq
  Filled 2020-07-07: qty 30

## 2020-07-07 MED ORDER — VITAL AF 1.2 CAL PO LIQD
1000.0000 mL | ORAL | Status: DC
  Administered 2020-07-07 – 2020-07-13 (×6): 1000 mL

## 2020-07-07 MED ORDER — METOLAZONE 5 MG PO TABS
5.0000 mg | ORAL_TABLET | Freq: Once | ORAL | Status: AC
  Administered 2020-07-07: 5 mg via ORAL
  Filled 2020-07-07: qty 1

## 2020-07-07 MED ORDER — HYDRALAZINE HCL 50 MG PO TABS
75.0000 mg | ORAL_TABLET | Freq: Three times a day (TID) | ORAL | Status: DC
  Administered 2020-07-07 – 2020-07-08 (×3): 75 mg
  Filled 2020-07-07 (×3): qty 1

## 2020-07-07 MED ORDER — FUROSEMIDE 40 MG PO TABS
40.0000 mg | ORAL_TABLET | Freq: Every day | ORAL | Status: DC
  Administered 2020-07-07 – 2020-07-13 (×7): 40 mg via ORAL
  Filled 2020-07-07 (×7): qty 1

## 2020-07-07 NOTE — Progress Notes (Signed)
Progress Note  Patient Name: Christopher [name] Date of Encounter: 07/07/2020  Primary Cardiologist: unknown  Subjective   Intubated; not on sedation  Inpatient Medications    Scheduled Meds: . acetaminophen (TYLENOL) oral liquid 160 mg/5 mL  650 mg Per Tube Q4H   Or  . acetaminophen  650 mg Rectal Q4H  . aspirin  81 mg Per Tube Daily  . atorvastatin  20 mg Per Tube Daily  . carvedilol  12.5 mg Per Tube BID WC  . chlorhexidine gluconate (MEDLINE KIT)  15 mL Mouth Rinse BID  . Chlorhexidine Gluconate Cloth  6 each Topical Daily  . cholestyramine  4 g Per Tube BID  . docusate  100 mg Per Tube BID  . donepezil  5 mg Per Tube QHS  . feeding supplement (PROSource TF)  45 mL Per Tube Daily  . free water  400 mL Per Tube Q6H  . furosemide  40 mg Oral Daily  . hydrALAZINE  50 mg Per Tube Q8H  . insulin aspart  0-15 Units Subcutaneous Q4H  . ipratropium-albuterol  3 mL Nebulization TID  . isosorbide dinitrate  30 mg Per Tube TID  . lisinopril  10 mg Per Tube Daily  . mouth rinse  15 mL Mouth Rinse 10 times per day  . metolazone  5 mg Oral Once  . pantoprazole sodium  40 mg Per Tube Q1200  . polyethylene glycol  17 g Per Tube Daily   Continuous Infusions: . sodium chloride 250 mL (06/29/20 2124)  .  ceFAZolin (ANCEF) IV Stopped (07/07/20 9735)  . feeding supplement (VITAL AF 1.2 CAL) 1,000 mL (07/06/20 0500)  . heparin 1,600 Units/hr (07/07/20 0700)   PRN Meds: acetaminophen, fentaNYL (SUBLIMAZE) injection, labetalol, polyethylene glycol   Vital Signs    Vitals:   07/07/20 0500 07/07/20 0537 07/07/20 0600 07/07/20 0700  BP: (!) 120/54 (!) 140/69 (!) 154/83 (!) 151/61  Pulse: 60  63 56  Resp: 13  (!) 9 15  Temp: 98.8 F (37.1 C)  98.6 F (37 C) 98.8 F (37.1 C)  TempSrc:      SpO2: 97%  98% 97%  Weight:      Height:        Intake/Output Summary (Last 24 hours) at 07/07/2020 0816 Last data filed at 07/07/2020 0700 Gross per 24 hour  Intake 2469.59 ml  Output 3725  ml  Net -1255.41 ml    I/O since admission: +4799 since admission  Filed Weights   07/05/20 0442 07/06/20 0500 07/07/20 0400  Weight: 83.2 kg 82.7 kg 81 kg    Telemetry    Sinus in the 80s with freqent PACs - Personally Reviewed  ECG    06/30/2020 ECG (independently read by me): ST at 115, RBBB, LAHB  Physical Exam    BP (!) 151/61   Pulse 56   Temp 98.8 F (37.1 C)   Resp 15   Ht _0  (1.727 m)   Wt 81 kg   SpO2 97%   BMI 27.15 kg/m  General: intubated; appears sedated Skin: normal turgor, no rashes, warm and dry HEENT: Normocephalic, atraumatic. Pupils equal round and reactive to light; sclera anicteric; extraocular muscles intact;  Nose without nasal septal hypertrophy Mouth/Parynx intubated Neck: No JVD, no carotid bruits; normal carotid upstroke Lungs: clear to ausculatation and percussion; no wheezing or rales Chest wall: without tenderness to palpitation Heart: PMI not displaced, regular with ectopy, s1 s2 normal, 1/6 systolic murmur, no diastolic murmur, no rubs,  gallops, thrills, or heaves Abdomen: soft, nontender; no hepatosplenomehaly, BS+; abdominal aorta nontender and not dilated by palpation. Back: no CVA tenderness Pulses 2+ Extremities: no clubbing cyanosis or edema, Homan's sign negative  Neurologic: per neurology   Labs    Chemistry Recent Labs  Lab 07/05/20 0114 07/06/20 0138 07/07/20 0146  NA 145 150* 151*  K 3.7 3.7 3.7  CL 115* 117* 117*  CO2 20* 22 24  GLUCOSE 177* 149* 141*  BUN 56* 58* 57*  CREATININE 1.86* 1.79* 1.69*  CALCIUM 9.2 9.4 9.6  PROT 5.7* 5.6* 5.8*  ALBUMIN 2.1* 2.1* 2.1*  AST 64* 52* 44*  ALT 75* 77* 70*  ALKPHOS 98 101 98  BILITOT 0.7 0.8 0.5  GFRNONAA 35* 37* 39*  GFRAA 41* 43* 46*  ANIONGAP _0 Hematology Recent Labs  Lab 07/05/20 0114 07/06/20 0138 07/07/20 0146  WBC 12.7* 14.4* 14.5*  RBC 4.35 4.26 4.08*  HGB 12.8* 12.1* 11.6*  HCT 40.9 39.1 37.8*  MCV 94.0 91.8 92.6  MCH 29.4  28.4 28.4  MCHC 31.3 30.9 30.7  RDW 15.3 15.4 15.2  PLT 222 247 288    Cardiac EnzymesNo results for input(s): TROPONINI in the last 168 hours. No results for input(s): TROPIPOC in the last 168 hours.   BNPNo results for input(s): BNP, PROBNP in the last 168 hours.   DDimer No results for input(s): DDIMER in the last 168 hours.   Lipid Panel  No results found for: CHOL, TRIG, HDL, CHOLHDL, VLDL, LDLCALC, LDLDIRECT   Radiology    DG CHEST PORT 1 VIEW  Result Date: 07/07/2020 CLINICAL DATA:  A tori failure. EXAM: PORTABLE CHEST 1 VIEW COMPARISON:  July 04, 2020. FINDINGS: The heart size and mediastinal contours are within normal limits. Endotracheal nasogastric tubes are unchanged in position. No pneumothorax is noted. Mild bibasilar atelectasis or infiltrates are noted. No significant pleural effusion is noted. The visualized skeletal structures are unremarkable. IMPRESSION: Mild bibasilar subsegmental atelectasis or infiltrates are noted. Electronically Signed   By: Marijo Conception M.D.   On: 07/07/2020 08:04   EEG adult  Result Date: 07/06/2020 Lora Havens, MD     07/06/2020  1:41 PM Patient Name:Masaru D Red Christians EHM:[id number] Epilepsy Attending:Priyanka Barbra Sarks Referring Physician/Provider: Noe Gens, NP Date: 07/06/2020 Duration:23.10 mins  Patient history:73yo M s/p cardiac arrest. EEG to evaluate for seizure  Level of alertness:comatose  AEDs during EEG study:None  Technical aspects: This EEG study was done with scalp electrodes positioned according to the 10-20 International system of electrode placement. Electrical activity was acquired at a sampling rate of _1  and reviewed with a high frequency filter of _2  and a low frequency filter of _3 . EEG data were recorded continuously and digitally stored.  Description:No clearposterior dominant rhythm was seen.EEG showed continuous generalized3-_4  theta-delta slowing EEG was reactive to verbal  stimularion.Hyperventilation and photic stimulation were not performed.   ABNORMALITY -Continuousslow, generalized  IMPRESSION: This study is suggestive of severe diffuse encephalopathy, nonspecific etiology.No seizures or epileptiform discharges were seen throughout the recording. EEG is similar to prior study performed on 06/30/2020  Lora Havens    Cardiac Studies    ECHO: 06/30/2020 IMPRESSIONS  1. There are cystic structures noted in the liver. Dedicated RUQ  ultrasound is recommended for better evaluation.  2. Left ventricular ejection fraction, by estimation, is 25 to 30%. The  left ventricle has severely decreased function. The left ventricle  demonstrates global hypokinesis. Left ventricular diastolic  parameters are  consistent with Grade I diastolic  dysfunction (impaired relaxation).  3. Right ventricular systolic function is mildly reduced. The right  ventricular size is mildly enlarged. Tricuspid regurgitation signal is  inadequate for assessing PA pressure.  4. Left atrial size was moderately dilated.  5. The mitral valve is grossly normal. Trivial mitral valve  regurgitation.  6. The aortic valve is tricuspid. Aortic valve regurgitation is not  visualized. No aortic stenosis is present.   Patient Profile     74 y.o. male  admitted with a VF arrest and now with severe anoxic encephalopathy remains with VDRF  Assessment & Plan    1. DAY 8 S/P VF arrest:  No recurrent VF. 2. PAF: off amiodarone without recurrent AF 3. Cardiomyopathy: EF 25 - 30% 3. Klebsiella pneumonis: on cefazolin 4. VDRF: remain intubated 5. 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  Signed, Troy Sine, MD, [facility] 07/07/2020, 8:16 AM

## 2020-07-07 NOTE — Progress Notes (Signed)
[place] for IV Heparin Indication: r/o ACS s/p cardiac arrest, possible afib  Allergies  Allergen Reactions  . Penicillins Rash    Patient Measurements: Height: 5\' 8"  (172.7 cm) Weight: 81 kg (178 lb 9.2 oz) IBW/kg (Calculated) : 68.4  Vital Signs: Temp: 98.2 F (36.8 C) (08/02 1100) Temp Source: Esophageal (08/02 0400) BP: 124/113 (08/02 1100) Pulse Rate: 60 (08/02 1100)  Labs: Recent Labs    07/05/20 0114 07/05/20 0114 07/06/20 0138 07/06/20 0138 07/06/20 1833 07/07/20 0146 07/07/20 1120  HGB 12.8*   < > 12.1*  --   --  11.6*  --   HCT 40.9  --  39.1  --   --  37.8*  --   PLT 222  --  247  --   --  288  --   HEPARINUNFRC 0.60   < > 0.83*   < > 0.89* 0.77* 0.57  CREATININE 1.86*  --  1.79*  --   --  1.69*  --    < > = values in this interval not displayed.    Estimated Creatinine Clearance: 37.7 mL/min (A) (by C-G formula based on SCr of 1.69 mg/dL (H)).   Assessment: 74 yr old male had witnessed arrest at home, EMS found pt to be in Vfib and gave shocks, epi, and amiodarone with ROSC, now w/ troponin elevated, also with Afib, on IV heparin.  Heparin level this afternoon came back therapeutic at 0.57, on 1600 units/hr. Hgb 11.6, plt 288. No s/sx of bleeding or infusion issues.   Goal of Therapy:  Heparin level 0.3-0.7 units/ml Monitor platelets by anticoagulation protocol: Yes   Plan:  Continue heparin drip at 1600 units/hr Monitor daily HL, CBC, and for s/sx of bleeding   Antonietta Jewel, PharmD, [place] Pharmacist  Phone: [phone] 07/07/2020 12:18 PM  Please check AMION for all [place] phone numbers After 10:00 PM, call [place] [phone]

## 2020-07-07 NOTE — Progress Notes (Signed)
Nutrition Follow-up  DOCUMENTATION CODES:   Not applicable  INTERVENTION:   Tube Feeding via Cortrak:  Increase Vital AF 1.2 to 65 ml/hr D/C Pro-Source TF   TF regimen Provides 1872 kcals, 117g of protein and 1264 mL of free water Meets 100% estimated calorie and protein needs  Total free water with current flushes and TF: 2864 mL/day   NUTRITION DIAGNOSIS:   Inadequate oral intake related to acute illness as evidenced by NPO status.  Being addressed via TF   GOAL:   Patient will meet greater than or equal to 90% of their needs  Progressing  MONITOR:   Vent status, TF tolerance, Weight trends, Labs  REASON FOR ASSESSMENT:   Consult Enteral/tube feeding initiation and management  ASSESSMENT:   74 yo male admitted post witnessed cardiac arrest requiring intubation, NSTEMI, AKI. PMH includes HTN, tobacco abuse, COPD   7/25 Admitted, Intubated 8/02 Cortrak placed today  Anoxic brain injury per neurology; plan to give pt more time for possible improvement. Plan for trach/PEG vs withdrawal based on pt progression  Normothermia TTM complete Patient is currently intubated on ventilator support, no sedation MV: 11.1 L/min Temp (24hrs), Avg:98.6 F (37 C), Min:97.5 F (36.4 C), Max:99.3 F (37.4 C)  Tolerating Vital AF 1.2 at 60 ml/hr, ProSource TF 45 mL daily, free water flush of 400 mL q 6 hours per Cortrak tube  Current weight 81 kg; admit weight 80.1 kg. Net + 5 L per I/O flow sheet  Rectal tube in place; noted pt started on questran but also has orders for scheduled miralax and colace. Consider stopping scheduled bowel regimen if questran ordered for diarrhea  Labs: sodium 151 (H)-free water flushes increased today Meds: questran, lasix, ss novolog, miralax  Diet Order:   Diet Order            Diet NPO time specified  Diet effective now                 EDUCATION NEEDS:   Not appropriate for education at this time  Skin:  Skin Assessment:  Reviewed RN Assessment  Last BM:  7/25  Height:   Ht Readings from Last 1 Encounters:  07/04/20 5\' 8"  (1.727 m)    Weight:   Wt Readings from Last 1 Encounters:  07/07/20 81 kg    BMI:  Body mass index is 27.15 kg/m.  Estimated Nutritional Needs:   Kcal:  1850 kcals  Protein:  96-128 g  Fluid:  >/= 1.8 L  Kerman Passey MS, RDN, LDN, CNSC Registered Dietitian III Clinical Nutrition RD Pager and On-Call Pager Number Located in [place]

## 2020-07-07 NOTE — Progress Notes (Signed)
NAME:  Christopher [name], MRN:  [id number], DOB:  1946/02/22, LOS: 8 ADMISSION DATE:  06/28/2020, CONSULTATION DATE:  07/07/20 REFERRING MD:  EDP, CHIEF COMPLAINT:  Cardiac arrest   Brief History   74 y.o. M with PMH of memory loss, HTN who had a witnessed arrest while sitting on the couch, 5 minutes down time without CPR.  EMS found patient in Vfib and pt was defibrillated 6 times and received 4 rounds EPI.  Intubated in the ED and PCCM consulted for admission  Pt was Intubated in the ED, CT head was negative for acute findings, he was initially hypotensive and started on Levophed.  Blood pressure improved and pressors were stopped and he became hypotensive.   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   Significant Hospital Events   7/25 Admit to PCCM 7/26 Being actively cooled on Arctic sun pads with goal normothermia 8/01 On vent, no change in mental status  Consults:  Cardiology  Procedures:  7/25 ETT >>  Significant Diagnostic Tests:  CT head 7/5 >> no acute findings, white matter disease, greatest in the left frontal lobe Echo 7/26 >> EF 25 to 30%, global hypokinesis, reduced RV SF MRI brain 7/29 >> no strong evidence of anoxic brain injury at this time, no acute intracranial abnormality, advanced chronic small vessel disease  Micro Data:  7/25 SARS-CoV-2 >> negative 7/30 Resp >> moderate klebsiella pneumoniae >> S-cefazolin   Antimicrobials:  Cefepime 7/30 >> 8/1  Cefazolin 8/1 >>   Interim history/subjective:  No events, remains unresponsive.  Objective   Blood pressure (!) 140/69, pulse 53, temperature 99.1 F (37.3 C), resp. rate 21, height 5\' 8"  (1.727 m), weight 81 kg, SpO2 98 %.    Vent Mode: PRVC FiO2 (%):  [40 %] 40 % Set Rate:  [16 bmp] 16 bmp Vt Set:  [550 mL] 550 mL PEEP:  [5 cmH20] 5  cmH20 Pressure Support:  [12 cmH20] 12 cmH20 Plateau Pressure:  [13 cmH20-23 cmH20] 23 cmH20   Intake/Output Summary (Last 24 hours) at 07/07/2020 0723 Last data filed at 07/07/2020 0600 Gross per 24 hour  Intake 2446.03 ml  Output 3725 ml  Net -1278.97 ml   Filed Weights   07/05/20 0442 07/06/20 0500 07/07/20 0400  Weight: 83.2 kg 82.7 kg 81 kg   GEN: ill appearing man on vent HEENT: ett in place, thick mucoid secretions, trachea midline CV: irregular, ext warm PULM: Scattered rhonci, triggering vent GI: protuberant, +BS EXT: no edema NEURO: he has decorticate posturing, does not respond to pain, his pupils are reactive, he has corneals, he triggers vent, he has some flexor movements of R which question are just exagerrated posturing, nonpurposeful, oculocephalic reflex is absent PSYCH: cannot assess SKIN: no rashes  Na 151 Cr stable 1.7 AST/ALT mild elevation Mild leukoctyosis Stable H/H Heparin at goal CBGs good   Lab Results  Component Value Date   WBC 14.5 (H) 07/07/2020   HGB 11.6 (L) 07/07/2020   HCT 37.8 (L) 07/07/2020   MCV 92.6 07/07/2020   PLT 288 07/07/2020   Lab Results  Component Value Date   CREATININE 1.69 (H) 07/07/2020   BUN 57 (H) 07/07/2020   NA 151 (H) 07/07/2020   K 3.7 07/07/2020   CL 117 (H) 07/07/2020   CO2 24 07/07/2020      Resolved Hospital Problem list  Assessment & Plan:   Acute Hypoxic Respiratory Failure in setting of Cardiac Arrest Klebsiella Pneumoniae PNA / Aspiration PNA -daily SBT but mental status barrier to extubation  -follow intermittent CXR  -narrow abx, add stop date for 7 days total cefazolin -follow PCT, fever curve, WBC   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  Completed TTM protocol.  Last sedation 7/31. - minimize sedating medications to allow for neuro exam / prognostication  - Neurology consult to help with prognostication - f/u repeat EEG  COPD  Tobacco abuse -continue duonebs   Acute Kidney Injury Hypokalemia  -Trend BMP / urinary output -free water 400 ml Q6 (increased 8/2) -Replace electrolytes as indicated -Avoid nephrotoxic agents, ensure adequate renal perfusion  Prediabetes Hgb A1c 6.1 -follow glucose trend  -SSI, moderate scale   Best practice:  Diet: tolerating tf Pain/Anxiety/Delirium protocol (if indicated): Precedex, goal RASS 0 to -1 VAP protocol (if indicated): HOB  DVT prophylaxis: Heparin gtt GI prophylaxis: protonix Glucose control: SSI Mobility: bed rest Code Status: full code Family Communication: called and updated daughter and uncle: discussed neuro exam and findings to date.  We agreed tentatively to get neurology input and have family meeting tomorrow 8/3 at [facility].  Will ask if palliative can help as well. Disposition: ICU  The patient is critically ill with multiple organ systems failure and requires high complexity decision making for assessment and support, frequent evaluation and titration of therapies, application of advanced monitoring technologies and extensive interpretation of multiple databases. Critical Care Time devoted to patient care services described in this note independent of APP/resident time (if applicable)  is 35 minutes.   Erskine Emery MD [facility] 07/07/2020 7:31 AM Personal pager: [phone] If unanswered, please page CCM On-call: [phone]

## 2020-07-07 NOTE — Consult Note (Signed)
Neurology Consultation  Reason for Consult: Prognostication Referring Physician: Ina Homes MD  CC:  History is obtained from: From chart  HPI: Christopher [name] is a 74 y.o. male with past medical history of shortness of breath, migraines, memory disorder, hypertension.  Patient had a witnessed arrest while sitting on the couch, 5 minutes downtime without CPR.  EMS found patient in V. fib and patient was defibrillated 6 times and received 4 rounds of epinephrine.  Patient was intubated in the ED.  Head CT was negative for acute findings, he was initially hypotensive and Levophed was started.  Blood pressure improved and pressors were stopped.  Patient was kept at normothermia.  Currently patient is being weaned off the vent.  Exam below.  Initial abnormal labs on arrival: Potassium 3.2, glucose 221, creatinine 1.96, AST 102, ALT 101, WBC 15.8   Past Medical History:  Diagnosis Date  . Colon polyp   . Hypertension   . 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 Migraine   . Mononucleosis 1990s  . Rash   . Shortness of breath ~1998   Pneumonia   . Skin moles    abnormal  . Wrist fracture    Childhood     Family History  Problem Relation Age of Onset  . Heart disease Mother        Died at age 48  . Parkinson's disease Mother   . Parkinson's disease Brother     Social History:   reports that he has been smoking. He has been smoking about 0.75 packs per day. He has never used smokeless tobacco. He reports current alcohol use. He reports that he does not use drugs.  Medications  Current Facility-Administered Medications:  .  0.9 %  sodium chloride infusion, 250 mL, Intravenous, Continuous, Anders Simmonds, MD, Last Rate: 10 mL/hr at 06/29/20 2124, 250 mL at 06/29/20 2124 .  [DISCONTINUED] acetaminophen (TYLENOL) tablet 650 mg, 650 mg, Oral, Q4H, 650 mg at 06/29/20 2028 **OR** acetaminophen (TYLENOL) 160 MG/5ML solution 650 mg, 650 mg, Per Tube, Q4H, 650 mg at 07/07/20 0857 **OR**  acetaminophen (TYLENOL) suppository 650 mg, 650 mg, Rectal, Q4H, Gleason, Otilio Carpen, PA-C, 650 mg at 06/29/20 0423 .  acetaminophen (TYLENOL) 160 MG/5ML solution 650 mg, 650 mg, Per Tube, Q6H PRN, Olalere, Adewale A, MD, 650 mg at 07/05/20 1629 .  aspirin chewable tablet 81 mg, 81 mg, Per Tube, Daily, Lorretta Harp, MD, 81 mg at 07/07/20 0857 .  atorvastatin (LIPITOR) tablet 20 mg, 20 mg, Per Tube, Daily, Lorretta Harp, MD, 20 mg at 07/07/20 0857 .  carvedilol (COREG) tablet 12.5 mg, 12.5 mg, Per Tube, BID WC, Agarwala, Ravi, MD, 12.5 mg at 07/07/20 0857 .  ceFAZolin (ANCEF) IVPB 2g/100 mL premix, 2 g, Intravenous, Q8H, Lyndee Leo, RPH, Stopped at 07/07/20 2202 .  chlorhexidine gluconate (MEDLINE KIT) (PERIDEX) 0.12 % solution 15 mL, 15 mL, Mouth Rinse, BID, Agarwala, Ravi, MD, 15 mL at 07/07/20 0859 .  Chlorhexidine Gluconate Cloth 2 % PADS 6 each, 6 each, Topical, Daily, Kipp Brood, MD, 6 each at 07/06/20 2200 .  cholestyramine (QUESTRAN) packet 4 g, 4 g, Per Tube, BID, Ollis, Brandi L, NP, 4 g at 07/06/20 2338 .  docusate (COLACE) 50 MG/5ML liquid 100 mg, 100 mg, Per Tube, BID, Olalere, Adewale A, MD, 100 mg at 07/07/20 0857 .  donepezil (ARICEPT) tablet 5 mg, 5 mg, Per Tube, QHS, Agarwala, Ravi, MD, 5 mg at 07/06/20 2105 .  feeding supplement (PROSource TF)  liquid 45 mL, 45 mL, Per Tube, Daily, Rigoberto Noel, MD, 45 mL at 07/07/20 0857 .  feeding supplement (VITAL AF 1.2 CAL) liquid 1,000 mL, 1,000 mL, Per Tube, Continuous, Rigoberto Noel, MD, Last Rate: 60 mL/hr at 07/06/20 0500, 1,000 mL at 07/06/20 0500 .  fentaNYL (SUBLIMAZE) injection 25-100 mcg, 25-100 mcg, Intravenous, Q1H PRN, Elmer Sow, MD, 50 mcg at 07/05/20 1141 .  free water 400 mL, 400 mL, Per Tube, Q6H, Candee Furbish, MD .  furosemide (LASIX) tablet 40 mg, 40 mg, Oral, Daily, Candee Furbish, MD, 40 mg at 07/07/20 0857 .  heparin ADULT infusion 100 units/mL (25000 units/244m sodium chloride 0.45%), 1,600  Units/hr, Intravenous, Continuous, LErenest Blank RPH, Last Rate: 16 mL/hr at 07/07/20 0700, 1,600 Units/hr at 07/07/20 0700 .  hydrALAZINE (APRESOLINE) tablet 75 mg, 75 mg, Per Tube, Q8H, SCandee Furbish MD .  insulin aspart (novoLOG) injection 0-15 Units, 0-15 Units, Subcutaneous, Q4H, Gleason, LOtilio Carpen PA-C, 2 Units at 07/07/20 0856 .  ipratropium-albuterol (DUONEB) 0.5-2.5 (3) MG/3ML nebulizer solution 3 mL, 3 mL, Nebulization, TID, Olalere, Adewale A, MD, 3 mL at 07/07/20 0750 .  isosorbide dinitrate (ISORDIL) tablet 30 mg, 30 mg, Per Tube, TID, MAudria Nine DO, 30 mg at 07/07/20 0904 .  labetalol (NORMODYNE) injection 10-20 mg, 10-20 mg, Intravenous, Q2H PRN, ARigoberto Noel MD, 20 mg at 07/07/20 0145 .  lisinopril (ZESTRIL) tablet 10 mg, 10 mg, Per Tube, Daily, Agarwala, Ravi, MD, 10 mg at 07/07/20 0856 .  MEDLINE mouth rinse, 15 mL, Mouth Rinse, 10 times per day, AKipp Brood MD, 15 mL at 07/07/20 0620 .  pantoprazole sodium (PROTONIX) 40 mg/20 mL oral suspension 40 mg, 40 mg, Per Tube, Q1200, ARigoberto Noel MD, 40 mg at 07/06/20 1149 .  pneumococcal 23 valent vaccine (PNEUMOVAX-23) injection 0.5 mL, 0.5 mL, Intramuscular, Prior to discharge, SCandee Furbish MD .  polyethylene glycol (MIRALAX / GLYCOLAX) packet 17 g, 17 g, Per Tube, Daily, Olalere, Adewale A, MD, 17 g at 07/01/20 0837 .  polyethylene glycol (MIRALAX / GLYCOLAX) packet 17 g, 17 g, Per Tube, Daily PRN, Olalere, Adewale A, MD  ROS:  Unable to obtain due to patient being intubated.   Exam: Current vital signs: BP (!) 168/67   Pulse 96   Temp 98.8 F (37.1 C)   Resp (!) 28   Ht 5' 8" (1.727 m)   Wt 81 kg   SpO2 98%   BMI 27.15 kg/m  Vital signs in last 24 hours: Temp:  [98.1 F (36.7 C)-99.3 F (37.4 C)] 98.8 F (37.1 C) (08/02 0700) Pulse Rate:  [41-120] 96 (08/02 0750) Resp:  [9-28] 28 (08/02 0750) BP: (120-183)/(47-89) 168/67 (08/02 0750) SpO2:  [96 %-100 %] 98 % (08/02 0750) FiO2 (%):  [40 %]  40 % (08/02 0750) Weight:  [81 kg] 81 kg (08/02 0400)   Constitutional: Appears well-developed and well-nourished.  Eyes: No scleral injection HENT: No OP obstrucion Head: Normocephalic.  Cardiovascular: Normal rate and regular rhythm.  Respiratory: Intubated, weaning off the vent and nonlabored GI: Soft.  No distension. There is no tenderness.  Skin: WDI  Neuro: Mental Status: Patient does not respond to verbal stimuli.  He does open his eyes partially noxious stimulation Cranial Nerves: II: patient does not respond confrontation bilaterally,  III,IV,VI: doll's response present bilaterally. pupils right 2 mm, left 2 mm,and sluggishly reactive bilaterally V,VII: corneal reflex present bilaterally  VIII: patient does not respond to verbal  stimuli IX,X: gag reflex present, XI: trapezius strength unable to test bilaterally XII: tongue strength unable to test Motor: Extremities moving with noxious stimuli antigravity throughout.  No spontaneous movement noted.  He does not clearly cross midline to localize noxious stimulation, but his movements do appear to be cortically driven. Sensory: Does respond to noxious stimuli with some upper extremity movement in addition grimacing Deep Tendon Reflexes:  2+ in upper extremities and knee jerk Plantars: downgoing bilaterally Cerebellar: Unable to perform  Labs I have reviewed labs in epic and the results pertinent to this consultation are:  CBC    Component Value Date/Time   WBC 14.5 (H) 07/07/2020 0146   RBC 4.08 (L) 07/07/2020 0146   HGB 11.6 (L) 07/07/2020 0146   HCT 37.8 (L) 07/07/2020 0146   PLT 288 07/07/2020 0146   MCV 92.6 07/07/2020 0146   MCH 28.4 07/07/2020 0146   MCHC 30.7 07/07/2020 0146   RDW 15.2 07/07/2020 0146   LYMPHSABS 4.3 (H) 06/29/2020 0009   MONOABS 0.5 06/29/2020 0009   EOSABS 0.5 06/29/2020 0009   BASOSABS 0.1 06/29/2020 0009    CMP     Component Value Date/Time   NA 151 (H) 07/07/2020 0146   K  3.7 07/07/2020 0146   CL 117 (H) 07/07/2020 0146   CO2 24 07/07/2020 0146   GLUCOSE 141 (H) 07/07/2020 0146   BUN 57 (H) 07/07/2020 0146   CREATININE 1.69 (H) 07/07/2020 0146   CALCIUM 9.6 07/07/2020 0146   PROT 5.8 (L) 07/07/2020 0146   ALBUMIN 2.1 (L) 07/07/2020 0146   AST 44 (H) 07/07/2020 0146   ALT 70 (H) 07/07/2020 0146   ALKPHOS 98 07/07/2020 0146   BILITOT 0.5 07/07/2020 0146   GFRNONAA 39 (L) 07/07/2020 0146   GFRAA 46 (L) 07/07/2020 0146     Imaging I have reviewed the images obtained:  CT-scan of the brain-no acute and intracranial abnormality.  Asymmetric relatively confluent white matter disease, greatest in the left frontal lobe.  This is progressed since the patient's prior MRI given differences and technique.  This is nonspecific but can be seen in the setting of chronic microvascular ischemia  MRI examination of the brain-no strong evidence of anoxic brain injury at this time.  No acute intracranial abnormality identified.  EEG on 8/1-suggestive of severe diffuse encephalopathy, nonspecific etiology.  No seizure or epileptiform discharges were seen throughout the recording.  Echocardiogram that was done on 06/30/2020 showed EF of 25 to 30% with left ventricular severely decreased function.  Left ventricle demonstrated global hypokinesis.  Etta Quill PA-C [facility] [phone]  M-F  (9:00 am- 5:00 PM)  07/07/2020, 10:05 AM   I have seen the patient reviewed the above note.  He has an intact brainstem and cortically driven movements.  At this point, I think he has some degree of consciousness, but still encephalopathic with reduced responsiveness.  I do question whether he might have a mild left gaze and slightly less response on the right to neck stimulation.  Assessment:  74 year old male who presented to the hospital after 5 minutes of no CPR and found to be in V. fib when EMS arrived.  Patient had 6 defibrillations and 4 rounds of epi before  ROSC.  Exam does suggest some cortical function, and therefore he may have some degree of recovery.  With negative imaging, I would favor giving him more time.  The MRI was suggestive of possible left-sided abnormality versus artifact, but it is possible that his current  exam is mediated by aphasia is much as anything.  Impression: Anoxic brain injury  Recommendations: 1) would continue to monitor for improvement, if he is not making significant strides by the time the tracheostomy needs to be considered, then long family discussion for consideration of withdrawal versus trach/PEG will need to be considered.  Roland Rack, MD [facility] [phone]  If 7pm- 7am, please page neurology on call as listed in [place].

## 2020-07-07 NOTE — Plan of Care (Signed)
°  Problem: Clinical Measurements: Goal: Ability to maintain clinical measurements within normal limits will improve Outcome: Progressing   Problem: Nutrition: Goal: Adequate nutrition will be maintained Outcome: Progressing Note: Tolerating tube feeds.   Problem: Elimination: Goal: Will not experience complications related to bowel motility Outcome: Progressing Goal: Will not experience complications related to urinary retention Outcome: Progressing   Problem: Pain Managment: Goal: General experience of comfort will improve Outcome: Progressing

## 2020-07-07 NOTE — Progress Notes (Signed)
Discussed case with Dr. Leonel Ramsay (neurology): his exam was encouraging enough to give this a few more days to see trend.  Erskine Emery MD PCCM

## 2020-07-07 NOTE — Progress Notes (Signed)
[place] for IV Heparin Indication: r/o ACS s/p cardiac arrest, possible afib  Allergies  Allergen Reactions  . Penicillins Rash    Patient Measurements: Height: 5\' 8"  (172.7 cm) Weight: 82.7 kg (182 lb 5.1 oz) IBW/kg (Calculated) : 68.4  Vital Signs: Temp: 98.6 F (37 C) (08/02 0000) Temp Source: Esophageal (08/02 0000) BP: 178/61 (08/02 0000) Pulse Rate: 98 (08/02 0000)  Labs: Recent Labs    07/05/20 0114 07/05/20 0114 07/06/20 0138 07/06/20 1833 07/07/20 0146  HGB 12.8*   < > 12.1*  --  11.6*  HCT 40.9  --  39.1  --  37.8*  PLT 222  --  247  --  288  HEPARINUNFRC 0.60   < > 0.83* 0.89* 0.77*  CREATININE 1.86*  --  1.79*  --  1.69*   < > = values in this interval not displayed.    Estimated Creatinine Clearance: 40.8 mL/min (A) (by C-G formula based on SCr of 1.69 mg/dL (H)).   Assessment: 74 yr old male had witnessed arrest at home, EMS found pt to be in Vfib and gave shocks, epi, and amiodarone with ROSC, now w/ troponin elevated, also with Afib, on IV heparin.  8/2 AM update:  Repeat heparin level remains elevated despite rate adjustment.  Goal of Therapy:  Heparin level 0.3-0.7 units/ml Monitor platelets by anticoagulation protocol: Yes   Plan:  Reduce heparin drip to 1600 units/hr Recheck heparin level at [place], PharmD, [place] Pharmacist Phone: [phone]

## 2020-07-07 NOTE — Procedures (Signed)
Cortrak  Person Inserting Tube:  Victor Granados E, RD Tube Type:  Cortrak - 43 inches Tube Location:  Left nare Initial Placement:  Stomach Secured by: Bridle Technique Used to Measure Tube Placement:  Documented cm marking at nare/ corner of mouth Cortrak Secured At:  65 cm    Cortrak Tube Team Note:  Consult received to place a Cortrak feeding tube.   No x-ray is required. RN may begin using tube.    If the tube becomes dislodged please keep the tube and contact the Cortrak team at [email or web address] (password [email or web address]) for replacement.  If after hours and replacement cannot be delayed, place a NG tube and confirm placement with an abdominal x-ray.    Tamari Busic, MS, RD, LDN RD pager number and weekend/on-call pager number located in Amion.   

## 2020-07-08 ENCOUNTER — Inpatient Hospital Stay (HOSPITAL_COMMUNITY): Payer: Medicare Other

## 2020-07-08 DIAGNOSIS — Z515 Encounter for palliative care: Secondary | ICD-10-CM

## 2020-07-08 DIAGNOSIS — J9602 Acute respiratory failure with hypercapnia: Secondary | ICD-10-CM

## 2020-07-08 DIAGNOSIS — J9600 Acute respiratory failure, unspecified whether with hypoxia or hypercapnia: Secondary | ICD-10-CM

## 2020-07-08 DIAGNOSIS — Z7189 Other specified counseling: Secondary | ICD-10-CM

## 2020-07-08 LAB — BASIC METABOLIC PANEL
Anion gap: 13 (ref 5–15)
BUN: 50 mg/dL — ABNORMAL HIGH (ref 8–23)
CO2: 25 mmol/L (ref 22–32)
Calcium: 9.4 mg/dL (ref 8.9–10.3)
Chloride: 109 mmol/L (ref 98–111)
Creatinine, Ser: 1.46 mg/dL — ABNORMAL HIGH (ref 0.61–1.24)
GFR calc Af Amer: 55 mL/min — ABNORMAL LOW (ref >60)
GFR calc non Af Amer: 47 mL/min — ABNORMAL LOW (ref >60)
Glucose, Bld: 123 mg/dL — ABNORMAL HIGH (ref 70–99)
Potassium: 3.8 mmol/L (ref 3.5–5.1)
Sodium: 147 mmol/L — ABNORMAL HIGH (ref 135–145)

## 2020-07-08 LAB — GLUCOSE, CAPILLARY
Glucose-Capillary: 134 mg/dL — ABNORMAL HIGH (ref 70–99)
Glucose-Capillary: 134 mg/dL — ABNORMAL HIGH (ref 70–99)
Glucose-Capillary: 122 mg/dL — ABNORMAL HIGH (ref 70–99)
Glucose-Capillary: 122 mg/dL — ABNORMAL HIGH (ref 70–99)
Glucose-Capillary: 146 mg/dL — ABNORMAL HIGH (ref 70–99)
Glucose-Capillary: 116 mg/dL — ABNORMAL HIGH (ref 70–99)
Glucose-Capillary: 153 mg/dL — ABNORMAL HIGH (ref 70–99)

## 2020-07-08 LAB — CBC
HCT: 38.1 % — ABNORMAL LOW (ref 39.0–52.0)
Hemoglobin: 11.6 g/dL — ABNORMAL LOW (ref 13.0–17.0)
MCH: 28.6 pg (ref 26.0–34.0)
MCHC: 30.4 g/dL (ref 30.0–36.0)
MCV: 93.8 fL (ref 80.0–100.0)
Platelets: 311 10*3/uL (ref 150–400)
RBC: 4.06 MIL/uL — ABNORMAL LOW (ref 4.22–5.81)
RDW: 15.1 % (ref 11.5–15.5)
WBC: 15 10*3/uL — ABNORMAL HIGH (ref 4.0–10.5)
nRBC: 0 % (ref 0.0–0.2)

## 2020-07-08 LAB — MAGNESIUM: Magnesium: 1.9 mg/dL (ref 1.7–2.4)

## 2020-07-08 LAB — PHOSPHORUS: Phosphorus: 3.9 mg/dL (ref 2.5–4.6)

## 2020-07-08 LAB — HEPARIN LEVEL (UNFRACTIONATED): Heparin Unfractionated: 0.5 IU/mL (ref 0.30–0.70)

## 2020-07-08 MED ORDER — VECURONIUM BROMIDE 10 MG IV SOLR
INTRAVENOUS | Status: AC
  Administered 2020-07-08: 10 mg via INTRAVENOUS
  Filled 2020-07-08: qty 10

## 2020-07-08 MED ORDER — VECURONIUM BROMIDE 10 MG IV SOLR
10.0000 mg | Freq: Once | INTRAVENOUS | Status: AC
  Administered 2020-07-08: 10 mg via INTRAVENOUS
  Filled 2020-07-08: qty 10

## 2020-07-08 MED ORDER — HYDRALAZINE HCL 50 MG PO TABS: 50.0000 mg | ORAL_TABLET | Freq: Three times a day (TID) | ORAL | Status: DC

## 2020-07-08 MED ORDER — STERILE WATER FOR INJECTION IJ SOLN
INTRAMUSCULAR | Status: AC
  Administered 2020-07-08: 10 mL
  Filled 2020-07-08: qty 10

## 2020-07-08 MED ORDER — VECURONIUM BROMIDE 10 MG IV SOLR: 10.0000 mg | Freq: Once | INTRAVENOUS | Status: AC

## 2020-07-08 MED ORDER — CARVEDILOL 6.25 MG PO TABS: 18.7500 mg | ORAL_TABLET | Freq: Two times a day (BID) | ORAL | Status: DC

## 2020-07-08 MED ORDER — CARVEDILOL 12.5 MG PO TABS
12.5000 mg | ORAL_TABLET | Freq: Two times a day (BID) | ORAL | Status: DC
  Administered 2020-07-09 (×2): 12.5 mg
  Filled 2020-07-08 (×4): qty 1

## 2020-07-08 MED ORDER — PROPOFOL 1000 MG/100ML IV EMUL
0.0000 ug/kg/min | INTRAVENOUS | Status: DC
  Administered 2020-07-08: 35 ug/kg/min via INTRAVENOUS
  Administered 2020-07-08: 5 ug/kg/min via INTRAVENOUS
  Administered 2020-07-08: 10 ug/kg/min via INTRAVENOUS
  Filled 2020-07-08 (×4): qty 100

## 2020-07-08 NOTE — Plan of Care (Signed)
Stable during shift. Remains on vent, sats stable. Strong cough, bleeding from mouth/tongue, Hgb stable, Hep gtt continues with therapeutic heparin level, discussed with pharmacist.   Semi purposeful movement - moves hands towards face when mouth care done. Does not follow commands. Afebrile, abx as ordered.   PRN Fentanyl x 1 for agitation/vent dyssynchrony with good results. Tolerating tube feeds, free water flushes, Na down to 147 this am.   Condom cath with good output. Flexiseal for diarrhea.   Problem: Clinical Measurements: Goal: Ability to maintain clinical measurements within normal limits will improve Outcome: Progressing Goal: Will remain free from infection Outcome: Progressing Goal: Respiratory complications will improve Outcome: Progressing Goal: Cardiovascular complication will be avoided Outcome: Progressing   Problem: Nutrition: Goal: Adequate nutrition will be maintained Outcome: Progressing   Problem: Coping: Goal: Level of anxiety will decrease Outcome: Progressing   Problem: Elimination: Goal: Will not experience complications related to urinary retention Outcome: Progressing   Problem: Pain Managment: Goal: General experience of comfort will improve Outcome: Progressing   Problem: Safety: Goal: Ability to remain free from injury will improve Outcome: Progressing   Problem: Skin Integrity: Goal: Risk for impaired skin integrity will decrease Outcome: Progressing

## 2020-07-08 NOTE — Progress Notes (Signed)
[place] for IV Heparin Indication: r/o ACS s/p cardiac arrest, possible afib  Allergies  Allergen Reactions  . Penicillins Rash    Patient Measurements: Height: 5\' 8"  (172.7 cm) Weight: 81.8 kg (180 lb 5.4 oz) IBW/kg (Calculated) : 68.4  Vital Signs: Temp: 98.6 F (37 C) (08/03 0700) Temp Source: Esophageal (08/03 0400) BP: 137/67 (08/03 0700) Pulse Rate: 79 (08/03 0700)  Labs: Recent Labs    07/06/20 0138 07/06/20 1833 07/07/20 0146 07/07/20 1120 07/08/20 0139  HGB 12.1*  --  11.6*  --  11.6*  HCT 39.1  --  37.8*  --  38.1*  PLT 247  --  288  --  311  HEPARINUNFRC 0.83*   < > 0.77* 0.57 0.50  CREATININE 1.79*  --  1.69*  --  1.46*   < > = values in this interval not displayed.    Estimated Creatinine Clearance: 43.6 mL/min (A) (by C-G formula based on SCr of 1.46 mg/dL (H)).   Assessment: 74 yr old male had witnessed arrest at home, EMS found pt to be in Vfib and gave shocks, epi, and amiodarone with ROSC, now w/ troponin elevated, also with Afib, on IV heparin.  Heparin level remains therapeutic at 0.5 on drip rate 1600 units/hr. RN reports patient continues to bite tongue despite bite block. No overt bleeding otherwise and CBC stable. No infusion issues.   Goal of Therapy:  Heparin level 0.3-0.7 units/ml Monitor platelets by anticoagulation protocol: Yes   Plan:  Continue heparin drip at 1600 units/hr Monitor daily HL, CBC, and for any in creased s/sx of bleeding   Richardine Service, PharmD PGY2 Cardiology Pharmacy Resident Phone: [phone] 07/08/2020  7:05 AM  Please check [email or web address] for unit-specific pharmacy phone numbers.

## 2020-07-08 NOTE — Progress Notes (Signed)
RN called radiology for an update to schedule a time for patient's ordered MRI. They stated his order was changed from stat to routine and he was moved down the list. They still have several stat orders ahead of him. Will continue to follow up.  Joellen Jersey, RN

## 2020-07-08 NOTE — Progress Notes (Signed)
Pt transported on ventilator with RT, RN and transport from 2H3 to MRI 3 and back without complications. Pt suctioned orally and via ETT before each transport. Pt tolerated well, vital signs remained stable throughout. RT will continue to monitor.

## 2020-07-08 NOTE — Progress Notes (Signed)
Assisted Dr. Tamala Julian with bedside bronchoscopy. Pt placed on 100% FiO2 for procedure. Pt tolerated well. Verbal order received to place pt back on previous PRVC settings. RT will continue to monitor.

## 2020-07-08 NOTE — Procedures (Signed)
Bronchoscopy Procedure Note  Christopher [name]  [id number]  [birth year]  Date:07/08/20  Time:5:06 PM   Provider Performing:Ivorie Uplinger C Tamala Julian   Procedure(s):  Initial Therapeutic Aspiration of Tracheobronchial Tree [phone])  Indication(s) High peak airway pressures  Consent Obtained verbally from family at bedside who watched procedure.  Anesthesia Propofol, vecuronium   Time Out Verified patient identification, verified procedure, site/side was marked, verified correct patient position, special equipment/implants available, medications/allergies/relevant history reviewed, required imaging and test results available.   Sterile Technique Usual hand hygiene, masks, gowns, and gloves were used   Procedure Description Bronchoscope advanced through endotracheal tube and into airway.  Airways were examined down to subsegmental level with findings noted below.   Following diagnostic evaluation, Transbronchial needle aspiration performed in trachea and right mainstem  Findings:  - Inspissated bloody mucus likely from tongue secretions/blood partially occluding area of trachea where ETT came out, advanced ETT past this area with immediate improvement in peak airway pressures - Mucus plugs in R mainstem suctioned - Distal airways open, chronic bronchiitic changes    Complications/Tolerance None; patient tolerated the procedure well. Chest X-ray is not needed post procedure.   EBL Minimal   Specimen(s) None

## 2020-07-08 NOTE — Progress Notes (Signed)
Progress Note  Patient Name: Christopher [name] Date of Encounter: 07/08/2020  Primary Cardiologist: unknown  Subjective   Intubated; sedated on vent  Inpatient Medications    Scheduled Meds: . acetaminophen (TYLENOL) oral liquid 160 mg/5 mL  650 mg Per Tube Q4H   Or  . acetaminophen  650 mg Rectal Q4H  . aspirin  81 mg Per Tube Daily  . atorvastatin  20 mg Per Tube Daily  . carvedilol  12.5 mg Per Tube BID WC  . chlorhexidine gluconate (MEDLINE KIT)  15 mL Mouth Rinse BID  . Chlorhexidine Gluconate Cloth  6 each Topical Daily  . cholestyramine  4 g Per Tube BID  . docusate  100 mg Per Tube BID  . donepezil  5 mg Per Tube QHS  . free water  400 mL Per Tube Q6H  . furosemide  40 mg Oral Daily  . hydrALAZINE  75 mg Per Tube Q8H  . insulin aspart  0-15 Units Subcutaneous Q4H  . ipratropium-albuterol  3 mL Nebulization TID  . isosorbide dinitrate  30 mg Per Tube TID  . lisinopril  10 mg Per Tube Daily  . mouth rinse  15 mL Mouth Rinse 10 times per day  . pantoprazole sodium  40 mg Per Tube Q1200  . polyethylene glycol  17 g Per Tube Daily   Continuous Infusions: . sodium chloride 10 mL/hr at 07/08/20 0800  .  ceFAZolin (ANCEF) IV Stopped (07/08/20 0553)  . feeding supplement (VITAL AF 1.2 CAL) 1,000 mL (07/07/20 2217)  . heparin 1,600 Units/hr (07/08/20 0800)  . propofol (DIPRIVAN) infusion 10 mcg/kg/min (07/08/20 0805)   PRN Meds: acetaminophen, fentaNYL (SUBLIMAZE) injection, labetalol, pneumococcal 23 valent vaccine, polyethylene glycol   Vital Signs    Vitals:   07/08/20 0815 07/08/20 0830 07/08/20 0845 07/08/20 0900  BP: 136/62 (!) 134/58 (!) 116/51 (!) 104/59  Pulse: (!) 103 70 (!) 109 (!) 115  Resp: '12 16 16 15  ' Temp: 98.2 F (36.8 C) 98.6 F (37 C) 98.2 F (36.8 C) 98.2 F (36.8 C)  TempSrc:      SpO2: 100% 99% 97% 97%  Weight:      Height:        Intake/Output Summary (Last 24 hours) at 07/08/2020 0939 Last data filed at 07/08/2020 0830 Gross per 24  hour  Intake 4215.19 ml  Output 2450 ml  Net 1765.19 ml    I/O since admission: +1610 since admission  Filed Weights   07/06/20 0500 07/07/20 0400 07/08/20 0400  Weight: 82.7 kg 81 kg 81.8 kg    Telemetry    Sinus in the 80s with freqent PACs - Personally Reviewed  ECG    07/07/2020 ECG (independently read by me): Sinus rhythm 88 with frequent PACs  06/30/2020 ECG (independently read by me): ST at 115, RBBB, LAHB  Physical Exam   BP (!) 104/59   Pulse (!) 115   Temp 98.2 F (36.8 C)   Resp 15   Ht '5\' 8"'  (1.727 m)   Wt 81.8 kg   SpO2 97%   BMI 27.42 kg/m  General: intubated and sedated today Skin: normal turgor, no rashes, warm and dry HEENT: Normocephalic, atraumatic. Pupils equal round and reactive to light; sclera anicteric; extraocular muscles intact;  Nose without nasal septal hypertrophy Mouth/Parynx intubated Neck: No JVD, no carotid bruits; normal carotid upstroke Lungs: clear to ausculatation and percussion; no wheezing or rales Chest wall: without tenderness to palpitation Heart: PMI not displaced, RRR, s1  s2 normal, 1/6 systolic murmur, no diastolic murmur, no rubs, gallops, thrills, or heaves Abdomen: soft, nontender; no hepatosplenomehaly, BS+; abdominal aorta nontender and not dilated by palpation. Back: no CVA tenderness Pulses 2+ Musculoskeletal:  no joint deformities Extremities: no clubbing cyanosis or edema, Homan's sign negative  Neurologic: per neuro    Labs    Chemistry Recent Labs  Lab 07/05/20 0114 07/05/20 0114 07/06/20 0138 07/07/20 0146 07/08/20 0139  NA 145   < > 150* 151* 147*  K 3.7   < > 3.7 3.7 3.8  CL 115*   < > 117* 117* 109  CO2 20*   < > '22 24 25  ' GLUCOSE 177*   < > 149* 141* 123*  BUN 56*   < > 58* 57* 50*  CREATININE 1.86*   < > 1.79* 1.69* 1.46*  CALCIUM 9.2   < > 9.4 9.6 9.4  PROT 5.7*  --  5.6* 5.8*  --   ALBUMIN 2.1*  --  2.1* 2.1*  --   AST 64*  --  52* 44*  --   ALT 75*  --  77* 70*  --   ALKPHOS 98  --   101 98  --   BILITOT 0.7  --  0.8 0.5  --   GFRNONAA 35*   < > 37* 39* 47*  GFRAA 41*   < > 43* 46* 55*  ANIONGAP 10   < > '11 10 13   ' < > = values in this interval not displayed.     Hematology Recent Labs  Lab 07/06/20 0138 07/07/20 0146 07/08/20 0139  WBC 14.4* 14.5* 15.0*  RBC 4.26 4.08* 4.06*  HGB 12.1* 11.6* 11.6*  HCT 39.1 37.8* 38.1*  MCV 91.8 92.6 93.8  MCH 28.4 28.4 28.6  MCHC 30.9 30.7 30.4  RDW 15.4 15.2 15.1  PLT 247 288 311    Cardiac EnzymesNo results for input(s): TROPONINI in the last 168 hours. No results for input(s): TROPIPOC in the last 168 hours.   BNPNo results for input(s): BNP, PROBNP in the last 168 hours.   DDimer No results for input(s): DDIMER in the last 168 hours.   Lipid Panel  No results found for: CHOL, TRIG, HDL, CHOLHDL, VLDL, LDLCALC, LDLDIRECT   Radiology    DG CHEST PORT 1 VIEW  Result Date: 07/07/2020 CLINICAL DATA:  A tori failure. EXAM: PORTABLE CHEST 1 VIEW COMPARISON:  July 04, 2020. FINDINGS: The heart size and mediastinal contours are within normal limits. Endotracheal nasogastric tubes are unchanged in position. No pneumothorax is noted. Mild bibasilar atelectasis or infiltrates are noted. No significant pleural effusion is noted. The visualized skeletal structures are unremarkable. IMPRESSION: Mild bibasilar subsegmental atelectasis or infiltrates are noted. Electronically Signed   By: Marijo Conception M.D.   On: 07/07/2020 08:04   EEG adult  Result Date: 07/06/2020 Lora Havens, MD     07/06/2020  1:41 PM Patient Name:Christopher [name] MQK:[id number] Epilepsy Attending:Priyanka Barbra Sarks Referring Physician/Provider: Noe Gens, NP Date: 07/06/2020 Duration:23.10 mins  Patient history:73yo M s/p cardiac arrest. EEG to evaluate for seizure  Level of alertness:comatose  AEDs during EEG study:None  Technical aspects: This EEG study was done with scalp electrodes positioned according to the 10-20 International system of  electrode placement. Electrical activity was acquired at a sampling rate of '500Hz'  and reviewed with a high frequency filter of '70Hz'  and a low frequency filter of '1Hz' . EEG data were recorded continuously and digitally stored.  Description:No clearposterior dominant rhythm was seen.EEG showed continuous generalized3-'6Hz'  theta-delta slowing EEG was reactive to verbal stimularion.Hyperventilation and photic stimulation were not performed.   ABNORMALITY -Continuousslow, generalized  IMPRESSION: This study is suggestive of severe diffuse encephalopathy, nonspecific etiology.No seizures or epileptiform discharges were seen throughout the recording. EEG is similar to prior study performed on 06/30/2020  Lora Havens    Cardiac Studies    ECHO: 06/30/2020 IMPRESSIONS  1. There are cystic structures noted in the liver. Dedicated RUQ  ultrasound is recommended for better evaluation.  2. Left ventricular ejection fraction, by estimation, is 25 to 30%. The  left ventricle has severely decreased function. The left ventricle  demonstrates global hypokinesis. Left ventricular diastolic parameters are  consistent with Grade I diastolic  dysfunction (impaired relaxation).  3. Right ventricular systolic function is mildly reduced. The right  ventricular size is mildly enlarged. Tricuspid regurgitation signal is  inadequate for assessing PA pressure.  4. Left atrial size was moderately dilated.  5. The mitral valve is grossly normal. Trivial mitral valve  regurgitation.  6. The aortic valve is tricuspid. Aortic valve regurgitation is not  visualized. No aortic stenosis is present.   Patient Profile     74 y.o. male  admitted with a VF arrest and now with severe anoxic encephalopathy remains with VDRF  Assessment & Plan    1. DAY 9 S/P VF arrest:  No recurrent VF. 2. PAF: off amiodarone without recurrent AF; currently sinus rhythm with frequent PVC's. Will increase coreg to 18.75 mg  bid today; BP stable 3. Cardiomyopathy: EF 25 - 30% 3. Klebsiella pneumonia: on cefazolin until 8/6 4. VDRF: remain intubated; per CCM 5. 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

## 2020-07-08 NOTE — Consult Note (Signed)
Consultation Note Date: 07/08/2020   Patient Name: Christopher [name]  DOB: 1946-01-08  MRN: [id number]  Age / Sex: 74 y.o., male  PCP: Cloward, Dianna Rossetti, MD Referring Physician: Candee Furbish, MD  Reason for Consultation: Establishing goals of care  HPI/Patient Profile: 74 y.o. male  with past medical history of hypertension, memory loss, colon polyps, migraines admitted on 06/28/2020 following vfib arrest. Patient had a witnessed arrest while sitting on couch, 5 minutes down time without CPR. EMS found patient in vfib and defibrillated 6 times, 4 rounds of epi. Intubated in ED. Patient remains with acute encephalopathy, ? Post anoxic. Neurology following. LTM EEG neg for seizures. MRI 7/29 without evidence of anoxic injury. Last sedation 7/31. Plan is for repeat MRI 07/08/20. Remains intubated/mechanicall ventilated. Neurological status barrier to extubation. Completed 7 days of cefazolin for Klebsiella pneumonia/aspiration pneumonia. Palliative medicine consultation for goals of care.   Clinical Assessment and Goals of Care:  I have reviewed medical records, discussed with care team, and patient assessment completed. No family at bedside.  Spoke with daughter Severn Goddard) via telephone to discuss goals of care.   I introduced Palliative Medicine as specialized medical care for people living with serious illness. It focuses on providing relief from the symptoms and stress of a serious illness.   We discussed a brief life review of the patient. Retired Tour manager. Prior to admission, living with [place] but daughter reports he was fairly independent and active. She does report active smoking and some memory loss.   Discussed events leading up to admission and course of hospitalization including diagnoses, interventions, plan of care. Reviewed recommendations from [facility] and neurology. Discussed plan for repeat  MRI today and watchful waiting for neurological improvement.   The patient is not married. Vallery Ridge is only living child. She is overwhelmed with decision making. She has support from the patient's brother and sister Jordan Hawks and [place]).   Advanced directives, concepts specific to code status, artifical feeding and hydration were discussed. Expressed concern that we are nearing the time frame for decision for trach/peg versus DNR/comfort pathway. Discussed these pathways in detail. Discussed quality of life vs. Quantity of life aspects. Again, Vallery Ridge is overwhelmed. Her father does not have a documented living will and they are unsure of his wishes. Kennith Gain, and Barnabas Lister are still trying to process these decisions.   Reassured of ongoing support from PMT provider with these tough decisions. PMT contact information given. Reassured of daily following for ongoing [place] discussions pending repeat MRI and neurological exam/recommendations.    SUMMARY OF RECOMMENDATIONS    Continue full code/full scope treatment and current plan of care.   Pending repeat MRI today and f/u neurology recommendations.  Patient does not have a documented living will. Patient's daughter, brother, and sister in frequent communication and will make decisions together. Discussed continued aggressive medical management (trach/peg) versus comfort focused pathway. Daughter overwhelmed with decisions. Plans to continue discussions with family.   Ongoing palliative discussions pending MRI and neuro recs. Daily PMT f/u  for [place] and support.   Code Status/Advance Care Planning:  Full code  Symptom Management:   Per attending  Palliative Prophylaxis:   Aspiration, Delirium Protocol, Frequent Pain Assessment, Oral Care and Turn Reposition  Psycho-social/Spiritual:   Desire for further Chaplaincy support: yes  Additional Recommendations: Caregiving  Support/Resources and Compassionate Wean  Education  Prognosis:   Guarded  Discharge Planning: To Be Determined      Primary Diagnoses: Present on Admission: . Cardiac arrest ([place])   I have reviewed the medical record, interviewed the patient and family, and examined the patient. The following aspects are pertinent.  Past Medical History:  Diagnosis Date  . Colon polyp   . Hypertension   . 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 Migraine   . Mononucleosis 1990s  . Rash   . Shortness of breath ~1998   Pneumonia   . Skin moles    abnormal  . Wrist fracture    Childhood   Social History   Socioeconomic History  . Marital status: Single    Spouse name: Not on file  . Number of children: 2  . Years of education: 44  . Highest education level: Not on file  Occupational History  . Occupation: N/A  Tobacco Use  . Smoking status: Current Every Day Smoker    Packs/day: 0.75  . Smokeless tobacco: Never Used  . Tobacco comment: 10/06/17 1 PPD  Substance and Sexual Activity  . Alcohol use: Yes    Comment: Occasional  . Drug use: No  . Sexual activity: Not on file  Other Topics Concern  . Not on file  Social History Narrative   Lives at home w/ his daughter   Right-handed   Caffeine: 1 cup of coffee, 1 tea and 1 soda per day   Social Determinants of Health   Financial Resource Strain:   . Difficulty of Paying Living Expenses:   Food Insecurity:   . Worried About Charity fundraiser in the Last Year:   . Arboriculturist in the Last Year:   Transportation Needs:   . Film/video editor (Medical):   Marland Kitchen Lack of Transportation (Non-Medical):   Physical Activity:   . Days of Exercise per Week:   . Minutes of Exercise per Session:   Stress:   . Feeling of Stress :   Social Connections:   . Frequency of Communication with Friends and Family:   . Frequency of Social Gatherings with Friends and Family:   . Attends Religious Services:   . Active Member of Clubs or Organizations:   . Attends Archivist  Meetings:   Marland Kitchen Marital Status:    Family History  Problem Relation Age of Onset  . Heart disease Mother        Died at age 56  . Parkinson's disease Mother   . Parkinson's disease Brother    Scheduled Meds: . acetaminophen (TYLENOL) oral liquid 160 mg/5 mL  650 mg Per Tube Q4H   Or  . acetaminophen  650 mg Rectal Q4H  . aspirin  81 mg Per Tube Daily  . atorvastatin  20 mg Per Tube Daily  . carvedilol  12.5 mg Per Tube BID WC  . chlorhexidine gluconate (MEDLINE KIT)  15 mL Mouth Rinse BID  . Chlorhexidine Gluconate Cloth  6 each Topical Daily  . cholestyramine  4 g Per Tube BID  . docusate  100 mg Per Tube BID  . donepezil  5 mg Per Tube QHS  .  free water  400 mL Per Tube Q6H  . furosemide  40 mg Oral Daily  . insulin aspart  0-15 Units Subcutaneous Q4H  . ipratropium-albuterol  3 mL Nebulization TID  . isosorbide dinitrate  30 mg Per Tube TID  . lisinopril  10 mg Per Tube Daily  . mouth rinse  15 mL Mouth Rinse 10 times per day  . pantoprazole sodium  40 mg Per Tube Q1200  . polyethylene glycol  17 g Per Tube Daily   Continuous Infusions: . sodium chloride 10 mL/hr at 07/08/20 1200  .  ceFAZolin (ANCEF) IV 2 g (07/08/20 1416)  . feeding supplement (VITAL AF 1.2 CAL) 1,000 mL (07/07/20 2217)  . heparin 1,600 Units/hr (07/08/20 1200)  . propofol (DIPRIVAN) infusion 35 mcg/kg/min (07/08/20 1415)   PRN Meds:.acetaminophen, fentaNYL (SUBLIMAZE) injection, labetalol, pneumococcal 23 valent vaccine, polyethylene glycol Medications Prior to Admission:  Prior to Admission medications   Medication Sig Start Date End Date Taking? Authorizing Provider  Ascorbic Acid (VITAMIN C PO) Take 1 tablet by mouth daily.   Yes [provider]  aspirin EC 81 MG tablet Take 81 mg by mouth daily. Swallow whole.   Yes [provider]  atorvastatin (LIPITOR) 20 MG tablet Take 20 mg by mouth daily. 04/28/20  Yes [provider]  budesonide-formoterol (SYMBICORT) 80-4.5  MCG/ACT inhaler Inhale 2 puffs into the lungs 2 (two) times daily as needed (shortness of breath/wheezing).   Yes [provider]  donepezil (ARICEPT) 5 MG tablet Take 5 mg by mouth at bedtime. 06/13/20  Yes [provider]  GLUCOSAMINE-CHONDROITIN PO Take 1 tablet by mouth daily.   Yes [provider]  Multiple Vitamins-Minerals ([employer] SPECTRAVITE PO) Take 1 tablet by mouth daily.    Yes [provider]  omeprazole (PRILOSEC) 20 MG capsule Take 20 mg by mouth 2 (two) times daily before a meal.   Yes [provider]  Potassium Chloride ER 20 MEQ TBCR Take 40 mEq by mouth daily.  04/28/20  Yes [provider]  Saw Palmetto, Serenoa repens, (SAW PALMETTO PO) Take 1 capsule by mouth daily.   Yes [provider]  valACYclovir (VALTREX) 500 MG tablet Take 500 mg by mouth 2 (two) times daily. 06/10/20  Yes [provider]   Allergies  Allergen Reactions  . Penicillins Rash   Review of Systems  Unable to perform ROS: Acuity of condition   Physical Exam Vitals and nursing note reviewed.  Constitutional:      Appearance: He is ill-appearing.     Interventions: He is sedated and intubated.  HENT:     Head: Normocephalic and atraumatic.  Cardiovascular:     Rate and Rhythm: Regular rhythm.  Pulmonary:     Effort: No tachypnea, accessory muscle usage or respiratory distress. He is intubated.  Abdominal:     Tenderness: There is no abdominal tenderness.  Skin:    General: Skin is warm and dry.  Neurological:     Comments: Withdrawals to noxious stimuli. Does not open eyes to voice, does not follow commands    Vital Signs: BP (!) 107/54   Pulse 77   Temp (P) 98.8 F (37.1 C) (Oral)   Resp 16   Ht '5\' 8"'  (1.727 m)   Wt 81.8 kg   SpO2 100%   BMI 27.42 kg/m  Pain Scale: CPOT   Pain Score: 0-No pain   SpO2: SpO2: 100 % O2 Device:SpO2: 100 % O2 Flow Rate: .   IO:  Intake/output summary:   Intake/Output Summary (Last  24 hours) at 07/08/2020 1652 Last data filed at 07/08/2020 1452 Gross per 24 hour  Intake 3983.46 ml  Output 2025 ml  Net 1958.46 ml    LBM: Last BM Date: 07/08/20 Baseline Weight: Weight: 81.6 kg Most recent weight: Weight: 81.8 kg     Palliative Assessment/Data: PPS 30%   Flowsheet Rows     Most Recent Value  Intake Tab  Referral Department Critical care  Unit at Time of Referral ICU  Palliative Care Primary Diagnosis Neurology  Palliative Care Type New Palliative care  Reason for referral Clarify Goals of Care  Date first seen by Palliative Care 07/08/20  Clinical Assessment  Palliative Performance Scale Score 30%  Psychosocial & Spiritual Assessment  Palliative Care Outcomes  Patient/Family meeting held? Yes  Who was at the meeting? daughter via telephone  Palliative Care Outcomes Clarified goals of care, Provided psychosocial or spiritual support, ACP counseling assistance      Time In:  1340 Time Out: 1420 Time Total: 49mn Greater than 50%  of this time was spent counseling and coordinating care related to the above assessment and plan.  Signed by:  MIhor Dow DNP, FNP-C Palliative Medicine Team  Phone: [phone]: [phone]  Please contact Palliative Medicine Team phone at [phone] questions and concerns.  For individual provider: See AShea Evans
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
## 2020-07-08 NOTE — Progress Notes (Signed)
NAME:  Christopher [name], MRN:  [id number], DOB:  30-Jan-1946, LOS: 9 ADMISSION DATE:  06/28/2020, CONSULTATION DATE:  07/08/20 REFERRING MD:  EDP, CHIEF COMPLAINT:  Cardiac arrest   Brief History   74 y.o. M with PMH of memory loss, HTN who had a witnessed arrest while sitting on the couch, 5 minutes down time without CPR.  EMS found patient in Vfib and pt was defibrillated 6 times and received 4 rounds EPI.  Intubated in the ED and PCCM consulted for admission  Pt was Intubated in the ED, CT head was negative for acute findings, he was initially hypotensive and started on Levophed.  Blood pressure improved and pressors were stopped and he became hypotensive.   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   Significant Hospital Events   7/25 Admit to PCCM 7/26 Being actively cooled on Arctic sun pads with goal normothermia 8/01 On vent, no change in mental status  Consults:  Cardiology  Procedures:  7/25 ETT >>  Significant Diagnostic Tests:  CT head 7/5 >> no acute findings, white matter disease, greatest in the left frontal lobe Echo 7/26 >> EF 25 to 30%, global hypokinesis, reduced RV SF MRI brain 7/29 >> no strong evidence of anoxic brain injury at this time, no acute intracranial abnormality, advanced chronic small vessel disease  Micro Data:  7/25 SARS-CoV-2 >> negative 7/30 Resp >> moderate klebsiella pneumoniae >> S-cefazolin   Antimicrobials:  Cefepime 7/30 >> 8/1  Cefazolin 8/1 >>   Interim history/subjective:  No events, remains poorly responsive on vent.  Objective   Blood pressure 137/67, pulse 79, temperature 98.6 F (37 C), resp. rate 15, height 5\' 8"  (1.727 m), weight 81.8 kg, SpO2 99 %.    Vent Mode: PRVC FiO2 (%):  [40 %] 40 % Set Rate:  [16 bmp] 16 bmp Vt Set:  [550 mL] 550 mL PEEP:  [5  cmH20] 5 cmH20 Pressure Support:  [12 cmH20] 12 cmH20 Plateau Pressure:  [13 cmH20-22 cmH20] 18 cmH20   Intake/Output Summary (Last 24 hours) at 07/08/2020 0748 Last data filed at 07/08/2020 0700 Gross per 24 hour  Intake 4385.22 ml  Output 1975 ml  Net 2410.22 ml   Filed Weights   07/06/20 0500 07/07/20 0400 07/08/20 0400  Weight: 82.7 kg 81 kg 81.8 kg   GEN: ill appearing man on vent HEENT: ett in place, thick mucoid secretions, trachea midline, tongue bloody from biting it today CV: irregular, ext warm PULM: Scattered rhonci, triggering vent GI: protuberant, +BS EXT: no edema NEURO: observed neurology perform: some cortical movement more notable in RUE, oculocephalic occasionally active, triggers vent, good cough/gag, occasional posturing PSYCH: cannot assess SKIN: no rashes  Labs stable  Lab Results  Component Value Date   WBC 15.0 (H) 07/08/2020   HGB 11.6 (L) 07/08/2020   HCT 38.1 (L) 07/08/2020   MCV 93.8 07/08/2020   PLT 311 07/08/2020   Lab Results  Component Value Date   CREATININE 1.46 (H) 07/08/2020   BUN 50 (H) 07/08/2020   NA 147 (H) 07/08/2020   K 3.8 07/08/2020   CL 109 07/08/2020   CO2 25 07/08/2020      Resolved Hospital Problem list      Assessment & Plan:   Acute Hypoxic Respiratory Failure in setting of Cardiac Arrest Klebsiella Pneumoniae PNA / Aspiration PNA -daily SBT but  mental status barrier to extubation  -follow intermittent CXR  - 7 days total cefazolin (end date 8/6) -follow fever curve, WBC   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  Completed TTM protocol.  Last sedation 7/31. - Repeat MRI today - Give a few more days then will likely have to decide between [facility] and watchful waiting vs. Comfort care  Self inflicted tongue trauma- start propofol, will give a dose of paralytic with this so I can get a good bite block in  COPD  Tobacco abuse -continue duonebs   Acute Kidney Injury Hypokalemia  -Trend BMP / urinary output -free water 400 ml Q6 -Replace electrolytes as indicated -Avoid nephrotoxic agents, ensure adequate renal perfusion  Prediabetes Hgb A1c 6.1 -follow glucose trend  -SSI, moderate scale   Best practice:  Diet: tolerating tf Pain/Anxiety/Delirium protocol (if indicated): Propofol VAP protocol (if indicated): HOB  DVT prophylaxis: Heparin gtt GI prophylaxis: protonix Glucose control: SSI Mobility: bed rest Code Status: full code Family Communication: see note 8/2, getting more ancillary information before reaching back out Disposition: ICU  The patient is critically ill with multiple organ systems failure and requires high complexity decision making for assessment and support, frequent evaluation and titration of therapies, application of advanced monitoring technologies and extensive interpretation of multiple databases. Critical Care Time devoted to patient care services described in this note independent of APP/resident time (if applicable)  is 32 minutes.   Erskine Emery MD [facility] 07/08/2020 7:48 AM Personal pager: [phone] If unanswered, please page CCM On-call: [phone]

## 2020-07-09 DIAGNOSIS — I471 Supraventricular tachycardia: Secondary | ICD-10-CM

## 2020-07-09 LAB — GLUCOSE, CAPILLARY
Glucose-Capillary: 122 mg/dL — ABNORMAL HIGH (ref 70–99)
Glucose-Capillary: 124 mg/dL — ABNORMAL HIGH (ref 70–99)
Glucose-Capillary: 126 mg/dL — ABNORMAL HIGH (ref 70–99)
Glucose-Capillary: 128 mg/dL — ABNORMAL HIGH (ref 70–99)
Glucose-Capillary: 131 mg/dL — ABNORMAL HIGH (ref 70–99)
Glucose-Capillary: 137 mg/dL — ABNORMAL HIGH (ref 70–99)
Glucose-Capillary: 141 mg/dL — ABNORMAL HIGH (ref 70–99)

## 2020-07-09 LAB — BASIC METABOLIC PANEL
Anion gap: 9 (ref 5–15)
BUN: 54 mg/dL — ABNORMAL HIGH (ref 8–23)
CO2: 25 mmol/L (ref 22–32)
Calcium: 8.9 mg/dL (ref 8.9–10.3)
Chloride: 109 mmol/L (ref 98–111)
Creatinine, Ser: 1.68 mg/dL — ABNORMAL HIGH (ref 0.61–1.24)
GFR calc Af Amer: 46 mL/min — ABNORMAL LOW (ref >60)
GFR calc non Af Amer: 40 mL/min — ABNORMAL LOW (ref >60)
Glucose, Bld: 123 mg/dL — ABNORMAL HIGH (ref 70–99)
Potassium: 3.7 mmol/L (ref 3.5–5.1)
Sodium: 143 mmol/L (ref 135–145)

## 2020-07-09 LAB — HEPARIN LEVEL (UNFRACTIONATED): Heparin Unfractionated: 0.5 IU/mL (ref 0.30–0.70)

## 2020-07-09 LAB — MAGNESIUM: Magnesium: 1.9 mg/dL (ref 1.7–2.4)

## 2020-07-09 LAB — TRIGLYCERIDES: Triglycerides: 86 mg/dL (ref <150)

## 2020-07-09 LAB — CBC
HCT: 34.8 % — ABNORMAL LOW (ref 39.0–52.0)
Hemoglobin: 10.8 g/dL — ABNORMAL LOW (ref 13.0–17.0)
MCH: 28.7 pg (ref 26.0–34.0)
MCHC: 31 g/dL (ref 30.0–36.0)
MCV: 92.6 fL (ref 80.0–100.0)
Platelets: 297 10*3/uL (ref 150–400)
RBC: 3.76 MIL/uL — ABNORMAL LOW (ref 4.22–5.81)
RDW: 14.7 % (ref 11.5–15.5)
WBC: 16.3 10*3/uL — ABNORMAL HIGH (ref 4.0–10.5)
nRBC: 0 % (ref 0.0–0.2)

## 2020-07-09 LAB — PHOSPHORUS: Phosphorus: 4.5 mg/dL (ref 2.5–4.6)

## 2020-07-09 LAB — TSH: TSH: 1.794 u[IU]/mL (ref 0.350–4.500)

## 2020-07-09 LAB — AMMONIA: Ammonia: 53 umol/L — ABNORMAL HIGH (ref 9–35)

## 2020-07-09 MED ORDER — POTASSIUM CHLORIDE 20 MEQ/15ML (10%) PO SOLN
40.0000 meq | Freq: Once | ORAL | Status: AC
  Administered 2020-07-09: 40 meq
  Filled 2020-07-09: qty 30

## 2020-07-09 MED ORDER — MAGNESIUM SULFATE 2 GM/50ML IV SOLN
2.0000 g | Freq: Once | INTRAVENOUS | Status: AC
  Administered 2020-07-09: 2 g via INTRAVENOUS
  Filled 2020-07-09: qty 50

## 2020-07-09 MED ORDER — DEXMEDETOMIDINE HCL IN NACL 400 MCG/100ML IV SOLN
0.4000 ug/kg/h | INTRAVENOUS | Status: DC
  Administered 2020-07-09: 0.4 ug/kg/h via INTRAVENOUS
  Filled 2020-07-09: qty 100

## 2020-07-09 NOTE — Progress Notes (Signed)
[place] for IV Heparin Indication: r/o ACS s/p cardiac arrest, possible afib  Allergies  Allergen Reactions  . Penicillins Rash    Patient Measurements: Height: 5\' 8"  (172.7 cm) Weight: 82.1 kg (181 lb) IBW/kg (Calculated) : 68.4  Vital Signs: Temp: 98.4 F (36.9 C) (08/04 0327) Temp Source: Oral (08/04 0327) BP: 129/63 (08/04 0630) Pulse Rate: 64 (08/04 0630)  Labs: Recent Labs    07/07/20 0146 07/07/20 0146 07/07/20 1120 07/08/20 0139 07/09/20 0227  HGB 11.6*   < >  --  11.6* 10.8*  HCT 37.8*  --   --  38.1* 34.8*  PLT 288  --   --  311 297  HEPARINUNFRC 0.77*   < > 0.57 0.50 0.50  CREATININE 1.69*  --   --  1.46* 1.68*   < > = values in this interval not displayed.    Estimated Creatinine Clearance: 40.9 mL/min (A) (by C-G formula based on SCr of 1.68 mg/dL (H)).   Assessment: 74 yr old male had witnessed arrest at home, EMS found pt to be in Vfib and gave shocks, epi, and amiodarone with ROSC, now w/ troponin elevated, also with Afib, on IV heparin.  Heparin level remains therapeutic at 0.5 on drip rate 1600 units/hr. RN states pt no longer biting tongue, only some red-tinged secretions. No overt bleeding otherwise and CBC stable. No infusion issues   Goal of Therapy:  Heparin level 0.3-0.7 units/ml Monitor platelets by anticoagulation protocol: Yes   Plan:  Continue heparin drip at 1600 units/hr Monitor daily HL, CBC, and for any increased s/sx of bleeding  F/u transition to [place] after trach/PEG placement  Richardine Service, PharmD PGY2 Cardiology Pharmacy Resident Phone: [phone] 07/09/2020  11:21 AM  Please check [email or web address] for unit-specific pharmacy phone numbers.

## 2020-07-09 NOTE — Progress Notes (Signed)
Daily Progress Note   Patient Name: Christopher [name]       Date: 07/09/2020 DOB: 04/23/1946  Age: 74 y.o. MRN#: [id number] Attending Physician: Candee Furbish, MD Primary Care Physician: Christopher Coyer, MD Admit Date: 06/28/2020  Reason for Consultation/Follow-up: Establishing goals of care  Subjective: Nursing staff at bedside bathing Christopher [name]. His eyes are open to stimulation.   Per brother at bedside, he did squeeze his hand earlier and lowered his arm on command (when reaching at ventilator).   No s/s of pain or discomfort.   GOC:  Spoke with Dr. Tamala Julian and RN. Repeat MRI negative for acute findings. Per neuro, patient does have signs of cortical function and with normal imagine, Dr. Leonel Ramsay would favor continued supportive care (trach/peg). There is no guarantee of good outcome but per neuro, there is significant chance for improvement given progress thus far.   F/u with brother, Christopher [name] at bedside. Christopher [name] and the patient's sister and daughter are in frequent communication and making decisions together. We reviewed recommendations from Dr. Tamala Julian and Dr. Leonel Ramsay. Christopher [name] shares that him and the family are on the same page, and following conversations with providers would like to continue full scope treatment including pursuing trach and peg. Family remains hopeful for ongoing improvement.   We did discuss what this pathway would look like including hope of getting him off of ventilator and tolerating trach collar. If so, this would open up more SNF trach opportunities in the area. If not, we will have to pursue vent SNF. Discussed that he will also need a feeding tube for ongoing artificial nutrition. Explained that if neurological status continues to improve, we will continue PT/OT/SLP  efforts. We did discuss high risk for complications with bed-bound status and severe deconditioning following his cardiac arrest. I reviewed Dr. Cecil Cobbs note with Christopher [name] that there is no guarantee he will have a good outcome, but recommendation to continue supportive care with some neurological improvement.   Also encouraged ongoing discussions as a family pending his ability to recovery. Discussed quality of life aspects. Reassured of ongoing support from PMT. PMT contact information given to brother.   Length of Stay: 10  Current Medications: Scheduled Meds:  . acetaminophen (TYLENOL) oral liquid 160 mg/5 mL  650 mg Per Tube Q4H   Or  . acetaminophen  650 mg Rectal Q4H  . aspirin  81 mg Per Tube Daily  . atorvastatin  20 mg Per Tube Daily  . carvedilol  12.5 mg Per Tube BID WC  . chlorhexidine gluconate (MEDLINE KIT)  15 mL Mouth Rinse BID  . Chlorhexidine Gluconate Cloth  6 each Topical Daily  . cholestyramine  4 g Per Tube BID  . docusate  100 mg Per Tube BID  . donepezil  5 mg Per Tube QHS  . free water  400 mL Per Tube Q6H  . furosemide  40 mg Oral Daily  . insulin aspart  0-15 Units Subcutaneous Q4H  . ipratropium-albuterol  3 mL Nebulization TID  . isosorbide dinitrate  30 mg Per Tube TID  . lisinopril  10 mg Per Tube Daily  . mouth rinse  15 mL Mouth Rinse 10 times per day  . pantoprazole sodium  40 mg Per Tube Q1200  . polyethylene glycol  17 g Per Tube Daily    Continuous Infusions: . sodium chloride 10 mL/hr at 07/09/20 1200  .  ceFAZolin (ANCEF) IV 2 g (07/09/20 1410)  . dexmedetomidine (PRECEDEX) IV infusion Stopped (07/09/20 1135)  . feeding supplement (VITAL AF 1.2 CAL) 1,000 mL (07/09/20 1144)  . heparin 1,600 Units/hr (07/09/20 1200)    PRN Meds: acetaminophen, fentaNYL (SUBLIMAZE) injection, labetalol, pneumococcal 23 valent vaccine, polyethylene glycol  Physical Exam Vitals and nursing note reviewed.  Constitutional:      Interventions: He is  intubated.     Comments: Opens eyes to stimulation  Cardiovascular:     Rate and Rhythm: Normal rate.  Pulmonary:     Effort: He is intubated.     Comments: Intubated/mechanically ventilated Skin:    General: Skin is warm and dry.  Neurological:     Comments: Nursing staff at bedside bathing. Patient's eyes open to stimulation. Cough/gag present.            Vital Signs: BP 113/72   Pulse 70   Temp 98.3 F (36.8 C)   Resp (!) 22   Ht '5\' 8"'  (1.727 m)   Wt 82.1 kg   SpO2 99%   BMI 27.52 kg/m  SpO2: SpO2: 99 % O2 Device: O2 Device: Ventilator O2 Flow Rate:    Intake/output summary:   Intake/Output Summary (Last 24 hours) at 07/09/2020 1605 Last data filed at 07/09/2020 1200 Gross per 24 hour  Intake 3378.78 ml  Output 1525 ml  Net 1853.78 ml   LBM: Last BM Date: 07/09/20 Baseline Weight: Weight: 81.6 kg Most recent weight: Weight: 82.1 kg       Palliative Assessment/Data: PPS 30%    Flowsheet Rows     Most Recent Value  Intake Tab  Referral Department Critical care  Unit at Time of Referral ICU  Palliative Care Primary Diagnosis Neurology  Palliative Care Type New Palliative care  Reason for referral Clarify Goals of Care  Date first seen by Palliative Care 07/08/20  Clinical Assessment  Palliative Performance Scale Score 30%  Psychosocial & Spiritual Assessment  Palliative Care Outcomes  Patient/Family meeting held? Yes  Who was at the meeting? daughter via telephone  [place] goals of care, Provided psychosocial or spiritual support, ACP counseling assistance      Patient Active Problem List   Diagnosis Date Noted  . Palliative care by specialist   . Goals of care, counseling/discussion   . Cardiac arrest ([place]) 06/29/2020  . Acute respiratory failure ([place])   . AKI (acute  kidney injury) ([place])   . NSTEMI (non-ST elevated myocardial infarction) ([place])   . 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 HYPERLIPIDEMIA 08/23/2009  . MIGRAINE  HEADACHE 08/23/2009  . HYPERTENSION 08/23/2009    Palliative Care Assessment & Plan   Patient Profile: 74 y.o. male  with past medical history of hypertension, memory loss, colon polyps, migraines admitted on 06/28/2020 following vfib arrest. Patient had a witnessed arrest while sitting on couch, 5 minutes down time without CPR. EMS found patient in vfib and defibrillated 6 times, 4 rounds of epi. Intubated in ED. Patient remains with acute encephalopathy, ? Post anoxic. Neurology following. LTM EEG neg for seizures. MRI 7/29 without evidence of anoxic injury. Last sedation 7/31. Plan is for repeat MRI 07/08/20. Remains intubated/mechanicall ventilated. Neurological status barrier to extubation. Completed 7 days of cefazolin for Klebsiella pneumonia/aspiration pneumonia. Palliative medicine consultation for goals of care.   Assessment: Acute hypoxic respiratory failure  VF arrest  Ischemic cardiomyopathy Acute decompensated combined heart failure Klebsiella pneumoniae/aspiration pneumonia Acute encephalopathy Hx of COPD, tobacco abuse AKI Hypokalemia  Recommendations/Plan:  Continue full code/full scope treatment  Patient does not have a documented living will. Patient's daughter, brother, and sister in frequent communication and will make decisions together.   Following further conversations with attending and recommendations from neurology, family would like to continue full scope treatment including trach/peg placement.   PMT provider will continue to follow intermittently for ongoing support.    Code Status: FULL   Code Status Orders  (From admission, onward)         Start     Ordered   06/29/20 0323  Full code  Continuous        06/29/20 0323        Code Status History    This patient has a current code status but no historical code status.   Advance Care Planning Activity       Prognosis:   Unable to determine  Discharge Planning:  To Be Determined  Care  plan was discussed with Dr Tamala Julian, RN, brother Christopher [name])  Thank you for allowing the Palliative Medicine Team to assist in the care of this patient.   Total Time 40 Prolonged Time Billed  no      Greater than 50%  of this time was spent counseling and coordinating care related to the above assessment and plan.  Ihor Dow, DNP, FNP-C Palliative Medicine Team  Phone: [phone] Fax: [phone]  Please contact Palliative Medicine Team phone at [phone] for questions and concerns.

## 2020-07-09 NOTE — Progress Notes (Addendum)
Progress Note  Patient Name: Christopher [name] Date of Encounter: 07/09/2020  Primary Cardiologist: unknown  Subjective   Intubated; sedated on vent; propofol just dc'd   Inpatient Medications    Scheduled Meds: . acetaminophen (TYLENOL) oral liquid 160 mg/5 mL  650 mg Per Tube Q4H   Or  . acetaminophen  650 mg Rectal Q4H  . aspirin  81 mg Per Tube Daily  . atorvastatin  20 mg Per Tube Daily  . carvedilol  12.5 mg Per Tube BID WC  . chlorhexidine gluconate (MEDLINE KIT)  15 mL Mouth Rinse BID  . Chlorhexidine Gluconate Cloth  6 each Topical Daily  . cholestyramine  4 g Per Tube BID  . docusate  100 mg Per Tube BID  . donepezil  5 mg Per Tube QHS  . free water  400 mL Per Tube Q6H  . furosemide  40 mg Oral Daily  . insulin aspart  0-15 Units Subcutaneous Q4H  . ipratropium-albuterol  3 mL Nebulization TID  . isosorbide dinitrate  30 mg Per Tube TID  . lisinopril  10 mg Per Tube Daily  . mouth rinse  15 mL Mouth Rinse 10 times per day  . pantoprazole sodium  40 mg Per Tube Q1200  . polyethylene glycol  17 g Per Tube Daily   Continuous Infusions: . sodium chloride 10 mL/hr at 07/09/20 0659  .  ceFAZolin (ANCEF) IV Stopped (07/09/20 0603)  . feeding supplement (VITAL AF 1.2 CAL) 1,000 mL (07/08/20 1743)  . heparin 1,600 Units/hr (07/09/20 0659)  . propofol (DIPRIVAN) infusion 15 mcg/kg/min (07/09/20 0659)   PRN Meds: acetaminophen, fentaNYL (SUBLIMAZE) injection, labetalol, pneumococcal 23 valent vaccine, polyethylene glycol   Vital Signs    Vitals:   07/09/20 0600 07/09/20 0630 07/09/20 0700 07/09/20 0740  BP: (!) 134/56 129/63 129/65 (!) 165/82  Pulse:  64 61 93  Resp: '17 19 19 ' (!) 24  Temp:    98.1 F (36.7 C)  TempSrc:    Oral  SpO2: 97% 98% 98% 100%  Weight:      Height:        Intake/Output Summary (Last 24 hours) at 07/09/2020 0806 Last data filed at 07/09/2020 0700 Gross per 24 hour  Intake 4492.73 ml  Output 1925 ml  Net 2567.73 ml    I/O since  admission: +9868 since admission  Filed Weights   07/08/20 0400 07/09/20 0013 07/09/20 0500  Weight: 81.8 kg 82.1 kg 82.1 kg    Telemetry    Sinus in the 70 with freqent PACs - Personally Reviewed Telemetry from earlier am at 1:58: 23 beat burst of SVT at 170 or slightly greater  ECG    07/07/2020 ECG (independently read by me): Sinus rhythm 88 with frequent PACs  06/30/2020 ECG (independently read by me): ST at 115, RBBB, LAHB  Physical Exam    BP (!) 165/82   Pulse 93   Temp 98.1 F (36.7 C) (Oral)   Resp (!) 24   Ht '5\' 8"'  (1.727 m)   Wt 82.1 kg   SpO2 100%   BMI 27.52 kg/m  General: sedated on vent Skin: normal turgor, no rashes, warm and dry HEENT: Normocephalic, atraumatic. Pupils equal round and reactive to light; sclera anicteric; extraocular muscles intact;  Nose without nasal septal hypertrophy Mouth/Parynx intubated Neck: No JVD, no carotid bruits; normal carotid upstroke Lungs:  no wheezing or rales Chest wall: without tenderness to palpitation Heart: PMI not displaced, RRR with PACs, s1 s2 normal,  1/6 systolic murmur, no diastolic murmur, no rubs, gallops, thrills, or heaves Abdomen: soft, nontender; no hepatosplenomehaly, BS+; abdominal aorta nontender and not dilated by palpation. Back: no CVA tenderness Pulses 2+ Musculoskeletal: full range of motion, normal strength, no joint deformities Extremities: no clubbing cyanosis or edema, Homan's sign negative  Neurologic: per neuro   Labs    Chemistry Recent Labs  Lab 07/05/20 0114 07/05/20 0114 07/06/20 0138 07/06/20 0138 07/07/20 0146 07/08/20 0139 07/09/20 0227  NA 145   < > 150*   < > 151* 147* 143  K 3.7   < > 3.7   < > 3.7 3.8 3.7  CL 115*   < > 117*   < > 117* 109 109  CO2 20*   < > 22   < > '24 25 25  ' GLUCOSE 177*   < > 149*   < > 141* 123* 123*  BUN 56*   < > 58*   < > 57* 50* 54*  CREATININE 1.86*   < > 1.79*   < > 1.69* 1.46* 1.68*  CALCIUM 9.2   < > 9.4   < > 9.6 9.4 8.9  PROT 5.7*   --  5.6*  --  5.8*  --   --   ALBUMIN 2.1*  --  2.1*  --  2.1*  --   --   AST 64*  --  52*  --  44*  --   --   ALT 75*  --  77*  --  70*  --   --   ALKPHOS 98  --  101  --  98  --   --   BILITOT 0.7  --  0.8  --  0.5  --   --   GFRNONAA 35*   < > 37*   < > 39* 47* 40*  GFRAA 41*   < > 43*   < > 46* 55* 46*  ANIONGAP 10   < > 11   < > '10 13 9   ' < > = values in this interval not displayed.     Hematology Recent Labs  Lab 07/07/20 0146 07/08/20 0139 07/09/20 0227  WBC 14.5* 15.0* 16.3*  RBC 4.08* 4.06* 3.76*  HGB 11.6* 11.6* 10.8*  HCT 37.8* 38.1* 34.8*  MCV 92.6 93.8 92.6  MCH 28.4 28.6 28.7  MCHC 30.7 30.4 31.0  RDW 15.2 15.1 14.7  PLT 288 311 297    Cardiac EnzymesNo results for input(s): TROPONINI in the last 168 hours. No results for input(s): TROPIPOC in the last 168 hours.   BNPNo results for input(s): BNP, PROBNP in the last 168 hours.   DDimer No results for input(s): DDIMER in the last 168 hours.   Lipid Panel     Component Value Date/Time   TRIG 86 07/09/2020 0227     Radiology    MR BRAIN WO CONTRAST  Result Date: 07/08/2020 CLINICAL DATA:  Cardiac arrest EXAM: MRI HEAD WITHOUT CONTRAST TECHNIQUE: Multiplanar, multiecho pulse sequences of the brain and surrounding structures were obtained without intravenous contrast. COMPARISON:  07/03/2020 FINDINGS: Brain: There is no reduced diffusion. No evidence of intracranial hemorrhage. There is no intracranial mass, mass effect, or edema. There is no hydrocephalus or extra-axial fluid collection. Ventricles are stable in size. Patchy and confluent foci of T2 hyperintensity in the supratentorial and pontine white matter are nonspecific but probably reflects stable chronic microvascular ischemic changes. There are a few chronic small vessel infarcts, for example within  the right thalamus. Vascular: Major vessel flow voids at the skull base are preserved. Skull and upper cervical spine: Normal marrow signal is preserved.  Sinuses/Orbits: Paranasal sinus mucosal thickening. Orbits are unremarkable. Other: Sella is unremarkable.  Patchy mastoid fluid opacification. IMPRESSION: There remains no evidence of hypoxic/ischemic injury. No new findings. Electronically Signed   By: Macy Mis M.D.   On: 07/08/2020 15:58    Cardiac Studies    ECHO: 06/30/2020 IMPRESSIONS  1. There are cystic structures noted in the liver. Dedicated RUQ  ultrasound is recommended for better evaluation.  2. Left ventricular ejection fraction, by estimation, is 25 to 30%. The  left ventricle has severely decreased function. The left ventricle  demonstrates global hypokinesis. Left ventricular diastolic parameters are  consistent with Grade I diastolic  dysfunction (impaired relaxation).  3. Right ventricular systolic function is mildly reduced. The right  ventricular size is mildly enlarged. Tricuspid regurgitation signal is  inadequate for assessing PA pressure.  4. Left atrial size was moderately dilated.  5. The mitral valve is grossly normal. Trivial mitral valve  regurgitation.  6. The aortic valve is tricuspid. Aortic valve regurgitation is not  visualized. No aortic stenosis is present.   Patient Profile     74 y.o. male  admitted with a VF arrest and now with severe anoxic encephalopathy remains with VDRF  Assessment & Plan    1. DAY 10 S/P VF arrest:  No recurrent VF. 2. PAF: off amiodarone without recurrent AF; . Coreg was increased to 18.75 yesterday but reduced back to 12.5 with low BP on propofol. Hydralazine was dc'd. Currently sinus rhythm with PACs  3. Cardiomyopathy: EF 25 - 30%. Hydralazine dc's with low BP. If neuro stabilizes and BP stable consider changing lisinopril to losartan   3. Klebsiella pneumonia: on cefazolin until 8/6 4. VDRF: remain intubated; per CCM 5. CKD: Cr 1.68 today 5. 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

## 2020-07-09 NOTE — Progress Notes (Signed)
Subjective: No significant change   Exam: Vitals:   07/09/20 1000 07/09/20 1100  BP: (!) 102/43   Pulse: (!) 55 65  Resp: 17 18  Temp:    SpO2: 90% 91%   Gen: In bed, intubated Resp: Ventilated Abd: soft, nt  Neuro: MS: Opens eyes to noxious stimulation, does not follow commands CN: Pupils are equal round and reactive, he appears to blink to threat, eyes are midline Motor: He withdraws to noxious stimulation in all four extremities Sensory: He responds to noxious stimulation in all four extremities  Pertinent Labs: Creatinine 1.46  Impression: 74 year old male with likely anoxic encephalopathy.  His exam is not as good as I would hope this many days after his insult, however he does have some signs of cortical function and with normal imaging, I would favor continued supportive care(e.g. trach/peg). Three is no guarantee of good outcome, but I think there is significant chance for improvement given progress thus far.   Recommendations: 1) TSH, ammonia  Roland Rack, MD [facility] [phone]  If 7pm- 7am, please page neurology on call as listed in [place].

## 2020-07-09 NOTE — Plan of Care (Signed)
Stable during shift. Remains on vent, sats stable, copious secretions. Diprivan gtt for vent dyssynchrony. Following neuro status - intermittently opens eyes to noxious stimuli, withdraws from pain, moves RUE more than LUE. Does not follow commands.   SR with very frequent PACs, 6 second SVT. BP soft last night, imdur held, BP leveled off during shift.   Condom cath, good output 1.9 L/24 hr.   Afebrile, abx as ordered.  Heparin gtt therapeutic, bleeding from mouth/tongue continues but much less than yesterday, now more thin secretions.    Problem: Clinical Measurements: Goal: Ability to maintain clinical measurements within normal limits will improve Outcome: Progressing Goal: Will remain free from infection Outcome: Progressing Goal: Diagnostic test results will improve Outcome: Progressing Goal: Respiratory complications will improve Outcome: Progressing Goal: Cardiovascular complication will be avoided Outcome: Progressing   Problem: Nutrition: Goal: Adequate nutrition will be maintained Outcome: Progressing   Problem: Elimination: Goal: Will not experience complications related to urinary retention Outcome: Progressing   Problem: Pain Managment: Goal: General experience of comfort will improve Outcome: Progressing   Problem: Safety: Goal: Ability to remain free from injury will improve Outcome: Progressing

## 2020-07-09 NOTE — Progress Notes (Addendum)
NAME:  Christopher [name], MRN:  [id number], DOB:  December 31, 1945, LOS: 70 ADMISSION DATE:  06/28/2020, CONSULTATION DATE:  07/09/20 REFERRING MD:  EDP, CHIEF COMPLAINT:  Cardiac arrest   Brief History   74 y.o. M with PMH of memory loss, HTN who had a witnessed arrest while sitting on the couch, 5 minutes down time without CPR.  EMS found patient in Vfib and pt was defibrillated 6 times and received 4 rounds EPI.  Intubated in the ED and PCCM consulted for admission  Pt was Intubated in the ED, CT head was negative for acute findings, he was initially hypotensive and started on Levophed.  Blood pressure improved and pressors were stopped and he became hypotensive.   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   Significant Hospital Events   7/25 Admit to PCCM 7/26 Being actively cooled on Arctic sun pads with goal normothermia 8/01 On vent, no change in mental status  Consults:  Cardiology  Procedures:  7/25 ETT >>  Significant Diagnostic Tests:  CT head 7/5 >> no acute findings, white matter disease, greatest in the left frontal lobe Echo 7/26 >> EF 25 to 30%, global hypokinesis, reduced RV SF MRI brain 7/29 >> no strong evidence of anoxic brain injury at this time, no acute intracranial abnormality, advanced chronic small vessel disease  Micro Data:  7/25 SARS-CoV-2 >> negative 7/30 Resp >> moderate klebsiella pneumoniae >> S-cefazolin   Antimicrobials:  Cefepime 7/30 >> 8/1  Cefazolin 8/1 >>   Interim history/subjective:  No events, remains poorly responsive on vent.  Objective   Blood pressure (!) 165/82, pulse 93, temperature 98.1 F (36.7 C), temperature source Oral, resp. rate (!) 24, height 5\' 8"  (1.727 m), weight 82.1 kg, SpO2 100 %.    Vent Mode: PRVC FiO2 (%):  [4 %-40 %] 40 % Set Rate:  [16 bmp] 16  bmp Vt Set:  [550 mL] 550 mL PEEP:  [5 cmH20] 5 cmH20 Plateau Pressure:  [12 cmH20-21 cmH20] 12 cmH20   Intake/Output Summary (Last 24 hours) at 07/09/2020 7846 Last data filed at 07/09/2020 0700 Gross per 24 hour  Intake 4492.73 ml  Output 1450 ml  Net 3042.73 ml   Filed Weights   07/08/20 0400 07/09/20 0013 07/09/20 0500  Weight: 81.8 kg 82.1 kg 82.1 kg   GEN: ill appearing man on vent HEENT: ett in place, thick mucoid secretions, trachea midline, tongue bloody from biting it today CV: irregular, ext warm PULM: Scattered rhonci, triggering vent GI: protuberant, +BS EXT: no edema NEURO: responds to pain in all 4 ext mostly in RUE, per neurology his exam is improving daily PSYCH: cannot assess SKIN: no rashes    Lab Results  Component Value Date   WBC 16.3 (H) 07/09/2020   HGB 10.8 (L) 07/09/2020   HCT 34.8 (L) 07/09/2020   MCV 92.6 07/09/2020   PLT 297 07/09/2020   Lab Results  Component Value Date   CREATININE 1.68 (H) 07/09/2020   BUN 54 (H) 07/09/2020   NA 143 07/09/2020   K 3.7 07/09/2020   CL 109 07/09/2020   CO2 25 07/09/2020      Resolved Hospital Problem list      Assessment & Plan:   Acute Hypoxic Respiratory Failure in setting of Cardiac Arrest Klebsiella Pneumoniae PNA / Aspiration PNA -daily SBT but mental status barrier to extubation  -  follow intermittent CXR  - 7 days total cefazolin (end date 8/6) - follow fever curve, WBC   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  Completed TTM protocol.  Last sedation 7/31. - Neurology thinks there is reasonable chance of recovery over next couple months - Called brother 8/4 and discussed next steps would be trach/PEG/LTACH depending on family GoC  Self inflicted tongue trauma- continue bite block for now, hopefully should improve after trach  COPD  Tobacco abuse -continue duonebs   Acute Kidney Injury Hypokalemia  -Trend BMP / urinary output -free water 400 ml Q6 -Replace electrolytes as indicated -Avoid nephrotoxic agents, ensure adequate renal perfusion  Prediabetes Hgb A1c 6.1 -follow glucose trend  -SSI, moderate scale   Best practice:  Diet: tolerating tf Pain/Anxiety/Delirium protocol (if indicated): trial off propofol today VAP protocol (if indicated): HOB  DVT prophylaxis: Heparin gtt GI prophylaxis: protonix Glucose control: SSI Mobility: bed rest Code Status: full code Family Communication: called brother 8/4, updating daily at bedside Disposition: ICU  The patient is critically ill with multiple organ systems failure and requires high complexity decision making for assessment and support, frequent evaluation and titration of therapies, application of advanced monitoring technologies and extensive interpretation of multiple databases. Critical Care Time devoted to patient care services described in this note independent of APP/resident time (if applicable)  is 34 minutes.   Erskine Emery MD [facility] 07/09/2020 8:33 AM Personal pager: [phone] If unanswered, please page CCM On-call: [phone]

## 2020-07-10 ENCOUNTER — Inpatient Hospital Stay (HOSPITAL_COMMUNITY): Payer: Medicare Other

## 2020-07-10 DIAGNOSIS — G931 Anoxic brain damage, not elsewhere classified: Secondary | ICD-10-CM

## 2020-07-10 DIAGNOSIS — I480 Paroxysmal atrial fibrillation: Secondary | ICD-10-CM

## 2020-07-10 LAB — GLUCOSE, CAPILLARY
Glucose-Capillary: 111 mg/dL — ABNORMAL HIGH (ref 70–99)
Glucose-Capillary: 148 mg/dL — ABNORMAL HIGH (ref 70–99)
Glucose-Capillary: 142 mg/dL — ABNORMAL HIGH (ref 70–99)
Glucose-Capillary: 157 mg/dL — ABNORMAL HIGH (ref 70–99)
Glucose-Capillary: 161 mg/dL — ABNORMAL HIGH (ref 70–99)

## 2020-07-10 LAB — BASIC METABOLIC PANEL
Anion gap: 11 (ref 5–15)
BUN: 49 mg/dL — ABNORMAL HIGH (ref 8–23)
CO2: 23 mmol/L (ref 22–32)
Calcium: 8.9 mg/dL (ref 8.9–10.3)
Chloride: 105 mmol/L (ref 98–111)
Creatinine, Ser: 1.5 mg/dL — ABNORMAL HIGH (ref 0.61–1.24)
GFR calc Af Amer: 53 mL/min — ABNORMAL LOW (ref >60)
GFR calc non Af Amer: 46 mL/min — ABNORMAL LOW (ref >60)
Glucose, Bld: 121 mg/dL — ABNORMAL HIGH (ref 70–99)
Potassium: 3.7 mmol/L (ref 3.5–5.1)
Sodium: 139 mmol/L (ref 135–145)

## 2020-07-10 LAB — MAGNESIUM: Magnesium: 2.3 mg/dL (ref 1.7–2.4)

## 2020-07-10 LAB — CBC
HCT: 31.9 % — ABNORMAL LOW (ref 39.0–52.0)
Hemoglobin: 10.2 g/dL — ABNORMAL LOW (ref 13.0–17.0)
MCH: 29.6 pg (ref 26.0–34.0)
MCHC: 32 g/dL (ref 30.0–36.0)
MCV: 92.5 fL (ref 80.0–100.0)
Platelets: 327 10*3/uL (ref 150–400)
RBC: 3.45 MIL/uL — ABNORMAL LOW (ref 4.22–5.81)
RDW: 14.5 % (ref 11.5–15.5)
WBC: 18.5 10*3/uL — ABNORMAL HIGH (ref 4.0–10.5)
nRBC: 0 % (ref 0.0–0.2)

## 2020-07-10 LAB — HEPARIN LEVEL (UNFRACTIONATED): Heparin Unfractionated: 0.43 IU/mL (ref 0.30–0.70)

## 2020-07-10 LAB — PHOSPHORUS: Phosphorus: 3.7 mg/dL (ref 2.5–4.6)

## 2020-07-10 MED ORDER — SCOPOLAMINE 1 MG/3DAYS TD PT72
1.0000 | MEDICATED_PATCH | TRANSDERMAL | Status: DC
  Administered 2020-07-10 – 2020-07-16 (×3): 1.5 mg via TRANSDERMAL
  Filled 2020-07-10 (×3): qty 1

## 2020-07-10 MED ORDER — VECURONIUM BROMIDE 10 MG IV SOLR
10.0000 mg | Freq: Once | INTRAVENOUS | Status: AC
  Administered 2020-07-11: 10 mg via INTRAVENOUS
  Filled 2020-07-10: qty 10

## 2020-07-10 MED ORDER — LACTULOSE 10 GM/15ML PO SOLN
30.0000 g | Freq: Two times a day (BID) | ORAL | Status: DC
  Administered 2020-07-10 – 2020-07-14 (×9): 30 g via ORAL
  Filled 2020-07-10 (×9): qty 45

## 2020-07-10 MED ORDER — MIDAZOLAM HCL 2 MG/2ML IJ SOLN
5.0000 mg | Freq: Once | INTRAMUSCULAR | Status: AC
  Administered 2020-07-11: 2 mg via INTRAVENOUS
  Filled 2020-07-10: qty 6

## 2020-07-10 MED ORDER — FENTANYL CITRATE (PF) 100 MCG/2ML IJ SOLN
200.0000 ug | Freq: Once | INTRAMUSCULAR | Status: AC
  Administered 2020-07-11: 100 ug via INTRAVENOUS
  Filled 2020-07-10: qty 4

## 2020-07-10 MED ORDER — FREE WATER
100.0000 mL | Freq: Four times a day (QID) | Status: DC
  Administered 2020-07-10 – 2020-07-16 (×22): 100 mL

## 2020-07-10 MED ORDER — CARVEDILOL 6.25 MG PO TABS
18.7500 mg | ORAL_TABLET | Freq: Two times a day (BID) | ORAL | Status: DC
  Administered 2020-07-10 (×2): 18.75 mg
  Filled 2020-07-10: qty 1

## 2020-07-10 MED ORDER — ETOMIDATE 2 MG/ML IV SOLN
40.0000 mg | Freq: Once | INTRAVENOUS | Status: AC
  Administered 2020-07-11: 20 mg via INTRAVENOUS
  Filled 2020-07-10: qty 20

## 2020-07-10 NOTE — Progress Notes (Addendum)
LTM hookup. No initial skin breakdown. Push button tested. Monitored by [facility]. Neuro notified.

## 2020-07-10 NOTE — Progress Notes (Signed)
[place] for IV Heparin Indication: r/o ACS s/p cardiac arrest, possible afib  Allergies  Allergen Reactions  . Penicillins Rash    Patient Measurements: Height: 5\' 8"  (172.7 cm) Weight: 82.9 kg (182 lb 12.2 oz) IBW/kg (Calculated) : 68.4  Vital Signs: Temp: 99.5 F (37.5 C) (08/05 0802) Temp Source: Axillary (08/05 0802) BP: 156/80 (08/05 0821) Pulse Rate: 74 (08/05 0821)  Labs: Recent Labs    07/08/20 0139 07/08/20 0139 07/09/20 0227 07/10/20 0317  HGB 11.6*   < > 10.8* 10.2*  HCT 38.1*  --  34.8* 31.9*  PLT 311  --  297 327  HEPARINUNFRC 0.50  --  0.50 0.43  CREATININE 1.46*  --  1.68* 1.50*   < > = values in this interval not displayed.    Estimated Creatinine Clearance: 46 mL/min (A) (by C-G formula based on SCr of 1.5 mg/dL (H)).   Assessment: 74 yr old male had witnessed arrest at home, EMS found pt to be in Vfib and gave shocks, epi, and amiodarone with ROSC, now w/ troponin elevated, also with Afib, on IV heparin.  Heparin level remains therapeutic at 0.43 on drip rate 1600 units/hr. No overt bleeding or infusion issues per RN. CBC stable. Plan Trach tomorrow morning at 1100, will hold heparin starting at 0300 per CCM.   Goal of Therapy:  Heparin level 0.3-0.7 units/ml Monitor platelets by anticoagulation protocol: Yes   Plan:  Continue heparin drip at 1600 units/hr Hold heparin starting 0300 on 8/6 for trach Monitor daily HL, CBC, and s/sx of bleeding  F/u transition to [place] after trach/PEG placement  Richardine Service, PharmD PGY2 Cardiology Pharmacy Resident Phone: [phone] 07/10/2020  10:33 AM  Please check [email or web address] for unit-specific pharmacy phone numbers.

## 2020-07-10 NOTE — Progress Notes (Signed)
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  His exam is not as good as I would hope this many days after his insult, however he does have some signs of cortical function and with normal imaging, I would favor continued supportive care(e.g. trach/peg). Three is no guarantee of good outcome, but I think there is significant chance for improvement given progress thus far.   Though I doubt his ammonia of 53 is responsible for his exam, would consider lactulose to exclude confounding factors.   Nursing reports some waxing and waning, so will get repeat overnight EEG to exclude seizures, though low suspicion.   Recommendations: 1) Consider lactulose.  2) repeat LTM EEG  Roland Rack, MD [facility] [phone]  If 7pm- 7am, please page neurology on call as listed in [place].

## 2020-07-10 NOTE — Progress Notes (Signed)
NAME:  Christopher [name], MRN:  [id number], DOB:  06/01/1946, LOS: 55 ADMISSION DATE:  06/28/2020, CONSULTATION DATE:  07/10/20 REFERRING MD:  EDP, CHIEF COMPLAINT:  Cardiac arrest   Brief History   74 y.o. M with PMH of memory loss, HTN who had a witnessed arrest while sitting on the couch, 5 minutes down time without CPR.  EMS found patient in Vfib and pt was defibrillated 6 times and received 4 rounds EPI.  Intubated in the ED and PCCM consulted for admission  Pt was Intubated in the ED, CT head was negative for acute findings, he was initially hypotensive and started on Levophed.  Blood pressure improved and pressors were stopped and he became hypotensive.   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   Significant Hospital Events   7/25 Admit to PCCM 7/26 Being actively cooled on Arctic sun pads with goal normothermia 8/01 On vent, no change in mental status  Consults:  Cardiology  Procedures:  7/25 ETT >>  Significant Diagnostic Tests:  CT head 7/5 >> no acute findings, white matter disease, greatest in the left frontal lobe Echo 7/26 >> EF 25 to 30%, global hypokinesis, reduced RV SF MRI brain 7/29 >> no strong evidence of anoxic brain injury at this time, no acute intracranial abnormality, advanced chronic small vessel disease  Micro Data:  7/25 SARS-CoV-2 >> negative 7/30 Resp >> moderate klebsiella pneumoniae >> S-cefazolin   Antimicrobials:  Cefepime 7/30 >> 8/1  Cefazolin 8/1 >>   Interim history/subjective:  No events, remains poorly responsive on vent.  Objective   Blood pressure 134/72, pulse 64, temperature 98.9 F (37.2 C), temperature source Oral, resp. rate 18, height 5\' 8"  (1.727 m), weight 82.9 kg, SpO2 96 %.    Vent Mode: PRVC FiO2 (%):  [40 %] 40 % Set Rate:  [16 bmp] 16 bmp Vt Set:   [500 mL-550 mL] 550 mL PEEP:  [5 cmH20] 5 cmH20 Plateau Pressure:  [16 cmH20-21 cmH20] 21 cmH20   Intake/Output Summary (Last 24 hours) at 07/10/2020 0744 Last data filed at 07/10/2020 0600 Gross per 24 hour  Intake 2025.69 ml  Output 3125 ml  Net -1099.31 ml   Filed Weights   07/09/20 0013 07/09/20 0500 07/10/20 0500  Weight: 82.1 kg 82.1 kg 82.9 kg   GEN: ill appearing man on vent HEENT: ett in place, thick mucoid secretions, trachea midline, tongue bloody from biting it today CV: irregular, ext warm PULM: Scattered rhonci, triggering vent GI: protuberant, +BS EXT: no edema NEURO: responds to pain in all 4 ext mostly in RUE, some reaching for tube with RUE PSYCH: cannot assess SKIN: no rashes    Lab Results  Component Value Date   WBC 18.5 (H) 07/10/2020   HGB 10.2 (L) 07/10/2020   HCT 31.9 (L) 07/10/2020   MCV 92.5 07/10/2020   PLT 327 07/10/2020   Lab Results  Component Value Date   CREATININE 1.50 (H) 07/10/2020   BUN 49 (H) 07/10/2020   NA 139 07/10/2020   K 3.7 07/10/2020   CL 105 07/10/2020   CO2 23 07/10/2020      Resolved Hospital Problem list      Assessment & Plan:   Acute Hypoxic Respiratory Failure in setting of Cardiac Arrest Klebsiella Pneumoniae PNA / Aspiration PNA -daily SBT but mental status barrier to extubation  -follow intermittent  CXR  - 7 days total cefazolin (end date 8/6) - follow fever curve, WBC   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  Completed TTM protocol.  Last sedation 7/31. Neurology thinks there is reasonable chance of recovery over next couple months.  Family would like this trial with trach/PEG and LTACH. Lurline Idol tomorrow - IR PEG consult  Self inflicted tongue trauma, secretions- continue bite block for now, hopefully should improve after trach - Scopolamine patch trial  COPD  Tobacco abuse -continue duonebs   Acute Kidney Injury Various electrolyte -Trend BMP / urinary output -drop free water to 100cc q 6h -Replace electrolytes as indicated -Avoid nephrotoxic agents, ensure adequate renal perfusion  Prediabetes Hgb A1c 6.1 -follow glucose trend  -SSI, moderate scale   Best practice:  Diet: tolerating tf Pain/Anxiety/Delirium protocol (if indicated): wean as able, will be easier with trach/PEG VAP protocol (if indicated): HOB  DVT prophylaxis: Heparin gtt GI prophylaxis: protonix Glucose control: SSI Mobility: bed rest Code Status: full code Family Communication: called brother 8/5, he agrees with plan Disposition: ICU  The patient is critically ill with multiple organ systems failure and requires high complexity decision making for assessment and support, frequent evaluation and titration of therapies, application of advanced monitoring technologies and extensive interpretation of multiple databases. Critical Care Time devoted to patient care services described in this note independent of APP/resident time (if applicable)  is 31 minutes.   Erskine Emery MD [facility] 07/10/2020 7:44 AM Personal pager: [phone] If unanswered, please page CCM On-call: [phone]

## 2020-07-10 NOTE — Progress Notes (Signed)
Progress Note  Patient Name: Christopher [name] Date of Encounter: 07/10/2020  Primary Cardiologist: unknown  Subjective   Intubated;  on vent; not on sedation, moving R arm  Inpatient Medications    Scheduled Meds: . acetaminophen (TYLENOL) oral liquid 160 mg/5 mL  650 mg Per Tube Q4H   Or  . acetaminophen  650 mg Rectal Q4H  . aspirin  81 mg Per Tube Daily  . atorvastatin  20 mg Per Tube Daily  . carvedilol  12.5 mg Per Tube BID WC  . chlorhexidine gluconate (MEDLINE KIT)  15 mL Mouth Rinse BID  . Chlorhexidine Gluconate Cloth  6 each Topical Daily  . cholestyramine  4 g Per Tube BID  . docusate  100 mg Per Tube BID  . donepezil  5 mg Per Tube QHS  . free water  100 mL Per Tube Q6H  . furosemide  40 mg Oral Daily  . insulin aspart  0-15 Units Subcutaneous Q4H  . ipratropium-albuterol  3 mL Nebulization TID  . isosorbide dinitrate  30 mg Per Tube TID  . lactulose  30 g Oral BID  . lisinopril  10 mg Per Tube Daily  . mouth rinse  15 mL Mouth Rinse 10 times per day  . pantoprazole sodium  40 mg Per Tube Q1200  . polyethylene glycol  17 g Per Tube Daily  . scopolamine  1 patch Transdermal Q72H   Continuous Infusions: . sodium chloride 10 mL/hr at 07/10/20 0600  .  ceFAZolin (ANCEF) IV 2 g (07/10/20 0630)  . dexmedetomidine (PRECEDEX) IV infusion Stopped (07/09/20 1135)  . feeding supplement (VITAL AF 1.2 CAL) 1,000 mL (07/09/20 1144)  . heparin 1,600 Units/hr (07/10/20 0600)   PRN Meds: acetaminophen, fentaNYL (SUBLIMAZE) injection, labetalol, pneumococcal 23 valent vaccine, polyethylene glycol   Vital Signs    Vitals:   07/10/20 0600 07/10/20 0700 07/10/20 0802 07/10/20 0803  BP: (!) 141/65 134/72    Pulse: (!) 115 64    Resp: 17 18    Temp:   99.5 F (37.5 C)   TempSrc:   Axillary   SpO2: 93% 96%  94%  Weight:      Height:        Intake/Output Summary (Last 24 hours) at 07/10/2020 0804 Last data filed at 07/10/2020 0600 Gross per 24 hour  Intake 1992.71 ml   Output 2825 ml  Net -832.29 ml    I/O since admission: +8769 since admission  Filed Weights   07/09/20 0013 07/09/20 0500 07/10/20 0500  Weight: 82.1 kg 82.1 kg 82.9 kg    Telemetry    Sinus in the 80s  With PACs- Personally Reviewed  8/4 Telemetry from 1:58 am:  23 beat burst of SVT at 170 or slightly greater  ECG    07/07/2020 ECG (independently read by me): Sinus rhythm 88 with frequent PACs  06/30/2020 ECG (independently read by me): ST at 115, RBBB, LAHB  Physical Exam    BP 134/72   Pulse 64   Temp 99.5 F (37.5 C) (Axillary)   Resp 18   Ht 5' 8" (1.727 m)   Wt 82.9 kg   SpO2 94%   BMI 27.79 kg/m  General: intubated Skin: normal turgor, no rashes, warm and dry HEENT: Normocephalic, atraumatic. Pupils equal round and reactive to light; sclera anicteric; extraocular muscles intact;  Nose without nasal septal hypertrophy Mouth/Parynx intubated Neck: No JVD, no carotid bruits; normal carotid upstroke Lungs: clear to ausculatation and percussion; no wheezing  or rales Chest wall: without tenderness to palpitation Heart: PMI not displaced, RRR, s1 s2 normal, 1/6 systolic murmur, no diastolic murmur, no rubs, gallops, thrills, or heaves Abdomen: soft, nontender; no hepatosplenomehaly, BS+; abdominal aorta nontender and not dilated by palpation. Back: no CVA tenderness Pulses 2+ Musculoskeletal: full range of motion, normal strength, no joint deformities Extremities: no clubbing cyanosis or edema, Homan's sign negative  Neurologic: grossly nonfocal; Cranial nerves grossly wnl Psychologic: Normal mood and affect   Labs    Chemistry Recent Labs  Lab 07/05/20 0114 07/05/20 0114 07/06/20 0138 07/06/20 0138 07/07/20 0146 07/07/20 0146 07/08/20 0139 07/09/20 0227 07/10/20 0317  NA 145   < > 150*   < > 151*   < > 147* 143 139  K 3.7   < > 3.7   < > 3.7   < > 3.8 3.7 3.7  CL 115*   < > 117*   < > 117*   < > 109 109 105  CO2 20*   < > 22   < > 24   < > 25 25  23  GLUCOSE 177*   < > 149*   < > 141*   < > 123* 123* 121*  BUN 56*   < > 58*   < > 57*   < > 50* 54* 49*  CREATININE 1.86*   < > 1.79*   < > 1.69*   < > 1.46* 1.68* 1.50*  CALCIUM 9.2   < > 9.4   < > 9.6   < > 9.4 8.9 8.9  PROT 5.7*  --  5.6*  --  5.8*  --   --   --   --   ALBUMIN 2.1*  --  2.1*  --  2.1*  --   --   --   --   AST 64*  --  52*  --  44*  --   --   --   --   ALT 75*  --  77*  --  70*  --   --   --   --   ALKPHOS 98  --  101  --  98  --   --   --   --   BILITOT 0.7  --  0.8  --  0.5  --   --   --   --   GFRNONAA 35*   < > 37*   < > 39*   < > 47* 40* 46*  GFRAA 41*   < > 43*   < > 46*   < > 55* 46* 53*  ANIONGAP 10   < > 11   < > 10   < > 13 9 11   < > = values in this interval not displayed.     Hematology Recent Labs  Lab 07/08/20 0139 07/09/20 0227 07/10/20 0317  WBC 15.0* 16.3* 18.5*  RBC 4.06* 3.76* 3.45*  HGB 11.6* 10.8* 10.2*  HCT 38.1* 34.8* 31.9*  MCV 93.8 92.6 92.5  MCH 28.6 28.7 29.6  MCHC 30.4 31.0 32.0  RDW 15.1 14.7 14.5  PLT 311 297 327    Cardiac EnzymesNo results for input(s): TROPONINI in the last 168 hours. No results for input(s): TROPIPOC in the last 168 hours.   BNPNo results for input(s): BNP, PROBNP in the last 168 hours.   DDimer No results for input(s): DDIMER in the last 168 hours.   Lipid Panel     Component Value Date/Time     TRIG 86 07/09/2020 0227     Radiology    MR BRAIN WO CONTRAST  Result Date: 07/08/2020 CLINICAL DATA:  Cardiac arrest EXAM: MRI HEAD WITHOUT CONTRAST TECHNIQUE: Multiplanar, multiecho pulse sequences of the brain and surrounding structures were obtained without intravenous contrast. COMPARISON:  07/03/2020 FINDINGS: Brain: There is no reduced diffusion. No evidence of intracranial hemorrhage. There is no intracranial mass, mass effect, or edema. There is no hydrocephalus or extra-axial fluid collection. Ventricles are stable in size. Patchy and confluent foci of T2 hyperintensity in the supratentorial and  pontine white matter are nonspecific but probably reflects stable chronic microvascular ischemic changes. There are a few chronic small vessel infarcts, for example within the right thalamus. Vascular: Major vessel flow voids at the skull base are preserved. Skull and upper cervical spine: Normal marrow signal is preserved. Sinuses/Orbits: Paranasal sinus mucosal thickening. Orbits are unremarkable. Other: Sella is unremarkable.  Patchy mastoid fluid opacification. IMPRESSION: There remains no evidence of hypoxic/ischemic injury. No new findings. Electronically Signed   By: Praneil  Patel M.D.   On: 07/08/2020 15:58    Cardiac Studies    ECHO: 06/30/2020 IMPRESSIONS  1. There are cystic structures noted in the liver. Dedicated RUQ  ultrasound is recommended for better evaluation.  2. Left ventricular ejection fraction, by estimation, is 25 to 30%. The  left ventricle has severely decreased function. The left ventricle  demonstrates global hypokinesis. Left ventricular diastolic parameters are  consistent with Grade I diastolic  dysfunction (impaired relaxation).  3. Right ventricular systolic function is mildly reduced. The right  ventricular size is mildly enlarged. Tricuspid regurgitation signal is  inadequate for assessing PA pressure.  4. Left atrial size was moderately dilated.  5. The mitral valve is grossly normal. Trivial mitral valve  regurgitation.  6. The aortic valve is tricuspid. Aortic valve regurgitation is not  visualized. No aortic stenosis is present.   Patient Profile     73 y.o. male  admitted with a VF arrest and now with severe anoxic encephalopathy remains with VDRF  Assessment & Plan    1. DAY 11 S/P VF arrest:  No recurrent VF. 2. PAF: off amiodarone without recurrent AF;   Will attempt increasing coreg again to 18.75 bid today with BP 150s systolic; previously was reduced with low BP when on propofol. Will recheck ECG tomorrow. Continues on heparin. 3.  Cardiomyopathy: EF 25 - 30%. Hydralazine dc's with low BP. If neuro stabilizes and BP stable consider changing lisinopril to losartan. Consider adding hyderalazine back tomorrow if BP remains slightly elevated  4. Klebsiella pneumonia: on cefazolin until 8/6 5. VDRF: remain intubated; plan for trach tomorrow 6. CKD: Cr 1.68 > 1.5 today 7. 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  8. Ammonia 53, now on lactulose    Signed,  A. , MD, FACC 07/10/2020, 8:04 AM   

## 2020-07-10 NOTE — Progress Notes (Signed)
Patient was transported to CT then back to 2H13 without any complications.

## 2020-07-11 ENCOUNTER — Inpatient Hospital Stay (HOSPITAL_COMMUNITY): Payer: Medicare Other

## 2020-07-11 LAB — CBC
HCT: 30.1 % — ABNORMAL LOW (ref 39.0–52.0)
Hemoglobin: 9.7 g/dL — ABNORMAL LOW (ref 13.0–17.0)
MCH: 29.7 pg (ref 26.0–34.0)
MCHC: 32.2 g/dL (ref 30.0–36.0)
MCV: 92 fL (ref 80.0–100.0)
Platelets: 374 10*3/uL (ref 150–400)
RBC: 3.27 MIL/uL — ABNORMAL LOW (ref 4.22–5.81)
RDW: 14.2 % (ref 11.5–15.5)
WBC: 19 10*3/uL — ABNORMAL HIGH (ref 4.0–10.5)
nRBC: 0 % (ref 0.0–0.2)

## 2020-07-11 LAB — BASIC METABOLIC PANEL
Anion gap: 11 (ref 5–15)
BUN: 42 mg/dL — ABNORMAL HIGH (ref 8–23)
CO2: 23 mmol/L (ref 22–32)
Calcium: 9.1 mg/dL (ref 8.9–10.3)
Chloride: 105 mmol/L (ref 98–111)
Creatinine, Ser: 1.28 mg/dL — ABNORMAL HIGH (ref 0.61–1.24)
GFR calc Af Amer: >60 mL/min (ref >60)
GFR calc non Af Amer: 55 mL/min — ABNORMAL LOW (ref >60)
Glucose, Bld: 136 mg/dL — ABNORMAL HIGH (ref 70–99)
Potassium: 3.8 mmol/L (ref 3.5–5.1)
Sodium: 139 mmol/L (ref 135–145)

## 2020-07-11 LAB — GLUCOSE, CAPILLARY
Glucose-Capillary: 112 mg/dL — ABNORMAL HIGH (ref 70–99)
Glucose-Capillary: 130 mg/dL — ABNORMAL HIGH (ref 70–99)
Glucose-Capillary: 134 mg/dL — ABNORMAL HIGH (ref 70–99)
Glucose-Capillary: 135 mg/dL — ABNORMAL HIGH (ref 70–99)
Glucose-Capillary: 139 mg/dL — ABNORMAL HIGH (ref 70–99)
Glucose-Capillary: 167 mg/dL — ABNORMAL HIGH (ref 70–99)

## 2020-07-11 LAB — PROTIME-INR
INR: 1.1 (ref 0.8–1.2)
Prothrombin Time: 13.6 seconds (ref 11.4–15.2)

## 2020-07-11 LAB — MAGNESIUM: Magnesium: 2.1 mg/dL (ref 1.7–2.4)

## 2020-07-11 LAB — PHOSPHORUS: Phosphorus: 3.6 mg/dL (ref 2.5–4.6)

## 2020-07-11 LAB — APTT: aPTT: 54 seconds — ABNORMAL HIGH (ref 24–36)

## 2020-07-11 MED ORDER — AMOXICILLIN-POT CLAVULANATE 875-125 MG PO TABS
1.0000 | ORAL_TABLET | Freq: Two times a day (BID) | ORAL | Status: DC
  Filled 2020-07-11 (×2): qty 1

## 2020-07-11 MED ORDER — LOSARTAN POTASSIUM 25 MG PO TABS
25.0000 mg | ORAL_TABLET | Freq: Two times a day (BID) | ORAL | Status: DC
  Administered 2020-07-11 – 2020-07-14 (×6): 25 mg
  Filled 2020-07-11 (×6): qty 1

## 2020-07-11 MED ORDER — POTASSIUM CHLORIDE 20 MEQ/15ML (10%) PO SOLN
40.0000 meq | Freq: Once | ORAL | Status: AC
  Administered 2020-07-11: 40 meq via ORAL
  Filled 2020-07-11: qty 30

## 2020-07-11 MED ORDER — AMOXICILLIN-POT CLAVULANATE 250-62.5 MG/5ML PO SUSR
875.0000 mg | Freq: Two times a day (BID) | ORAL | Status: DC
  Filled 2020-07-11: qty 17.5

## 2020-07-11 MED ORDER — CARVEDILOL 25 MG PO TABS
25.0000 mg | ORAL_TABLET | Freq: Two times a day (BID) | ORAL | Status: DC
  Administered 2020-07-11 – 2020-07-16 (×11): 25 mg
  Filled 2020-07-11 (×11): qty 1

## 2020-07-11 MED ORDER — HEPARIN (PORCINE) 25000 UT/250ML-% IV SOLN
1600.0000 [IU]/h | INTRAVENOUS | Status: DC
  Administered 2020-07-11 – 2020-07-13 (×3): 1600 [IU]/h via INTRAVENOUS
  Filled 2020-07-11 (×3): qty 250

## 2020-07-11 NOTE — Progress Notes (Signed)
[facility] for IV Heparin Indication: r/o ACS s/p cardiac arrest, possible afib  Allergies  Allergen Reactions  . Penicillins Rash    Patient Measurements: Height: 5\' 8"  (172.7 cm) Weight: 81.9 kg (180 lb 8.9 oz) IBW/kg (Calculated) : 68.4  Vital Signs: Temp: 98.2 F (36.8 C) (08/06 0758) Temp Source: Axillary (08/06 0758) BP: 99/56 (08/06 1421) Pulse Rate: 60 (08/06 1421)  Labs: Recent Labs    07/09/20 0227 07/09/20 0227 07/10/20 0317 07/11/20 0239  HGB 10.8*   < > 10.2* 9.7*  HCT 34.8*  --  31.9* 30.1*  PLT 297  --  327 374  APTT  --   --   --  54*  LABPROT  --   --   --  13.6  INR  --   --   --  1.1  HEPARINUNFRC 0.50  --  0.43  --   CREATININE 1.68*  --  1.50* 1.28*   < > = values in this interval not displayed.    Estimated Creatinine Clearance: 49.7 mL/min (A) (by C-G formula based on SCr of 1.28 mg/dL (H)).   Assessment: 74 yr old male had witnessed arrest at home, EMS found pt to be in Vfib and gave shocks, epi, and amiodarone with ROSC, now w/ troponin elevated, also with Afib, on IV heparin.  Heparin was turned off this morning for trach placement. Patient now s/p trach placement with minimal blood loss - OK to restart heparin per CCM. Pt was previously therapeutic on drip rate 1600 units/hr. Will restart heparin at this rate. CBC stable. No overt bleeding.   Goal of Therapy:  Heparin level 0.3-0.7 units/ml Monitor platelets by anticoagulation protocol: Yes   Plan:  Restart heparin infusion at 1600 units/hr Check 8-hr HL Monitor daily HL, CBC, and s/sx of bleeding  F/u transition to [place] after PEG placement  Richardine Service, PharmD PGY2 Cardiology Pharmacy Resident Phone: [phone] 07/11/2020  3:14 PM  Please check [email or web address] for unit-specific pharmacy phone numbers.

## 2020-07-11 NOTE — Progress Notes (Signed)
Progress Note  Patient Name: Christopher [name] Date of Encounter: 07/11/2020  Primary Cardiologist: unknown  Subjective   Remains intubated on ventilator.  Currently not receiving any sedation medication.  Continues to be somnolent.  Inpatient Medications    Scheduled Meds: . acetaminophen (TYLENOL) oral liquid 160 mg/5 mL  650 mg Per Tube Q4H   Or  . acetaminophen  650 mg Rectal Q4H  . aspirin  81 mg Per Tube Daily  . atorvastatin  20 mg Per Tube Daily  . carvedilol  18.75 mg Per Tube BID WC  . chlorhexidine gluconate (MEDLINE KIT)  15 mL Mouth Rinse BID  . Chlorhexidine Gluconate Cloth  6 each Topical Daily  . docusate  100 mg Per Tube BID  . donepezil  5 mg Per Tube QHS  . etomidate  40 mg Intravenous Once  . fentaNYL (SUBLIMAZE) injection  200 mcg Intravenous Once  . free water  100 mL Per Tube Q6H  . furosemide  40 mg Oral Daily  . insulin aspart  0-15 Units Subcutaneous Q4H  . ipratropium-albuterol  3 mL Nebulization TID  . isosorbide dinitrate  30 mg Per Tube TID  . lactulose  30 g Oral BID  . lisinopril  10 mg Per Tube Daily  . mouth rinse  15 mL Mouth Rinse 10 times per day  . midazolam  5 mg Intravenous Once  . pantoprazole sodium  40 mg Per Tube Q1200  . polyethylene glycol  17 g Per Tube Daily  . scopolamine  1 patch Transdermal Q72H  . vecuronium  10 mg Intravenous Once   Continuous Infusions: . sodium chloride Stopped (07/11/20 0207)  .  ceFAZolin (ANCEF) IV 200 mL/hr at 07/11/20 0700  . dexmedetomidine (PRECEDEX) IV infusion Stopped (07/09/20 1135)  . feeding supplement (VITAL AF 1.2 CAL) 65 mL/hr at 07/10/20 1800   PRN Meds: acetaminophen, fentaNYL (SUBLIMAZE) injection, labetalol, pneumococcal 23 valent vaccine, polyethylene glycol   Vital Signs    Vitals:   07/11/20 0600 07/11/20 0732 07/11/20 0733 07/11/20 0758  BP: (!) 148/64     Pulse: 63     Resp: (!) 22     Temp:    98.2 F (36.8 C)  TempSrc:    Axillary  SpO2: 94% 97% 97%   Weight:       Height:        Intake/Output Summary (Last 24 hours) at 07/11/2020 0826 Last data filed at 07/11/2020 0700 Gross per 24 hour  Intake 1980.7 ml  Output 2500 ml  Net -519.3 ml    I/O since admission: +8534 since admission  Filed Weights   07/09/20 0500 07/10/20 0500 07/11/20 0451  Weight: 82.1 kg 82.9 kg 81.9 kg    Telemetry    Sinus in the 80s  With PACs- Personally Reviewed;  Short burst of SVT overnight  8/4 Telemetry from 1:58 am:  23 beat burst of SVT at 170 or slightly greater  ECG    07/11/2020 ECG (independently read by me): Sinus rhythm at 88, PACs, inferolateral T wave inversion  07/07/2020 ECG (independently read by me): Sinus rhythm 88 with frequent PACs  06/30/2020 ECG (independently read by me): ST at 115, RBBB, LAHB  Physical Exam   BP (!) 148/64   Pulse 63   Temp 98.2 F (36.8 C) (Axillary)   Resp (!) 22   Ht '5\' 8"'  (1.727 m)   Wt 81.9 kg   SpO2 97%   BMI 27.45 kg/m  General: Intubated; not  ssedated, but somnolent Skin: normal turgor, no rashes, warm and dry HEENT: Normocephalic, atraumatic. Pupils equal round and reactive to light; sclera anicteric; extraocular muscles intact;  Nose without nasal septal hypertrophy Mouth/Parynx intubated Neck: No JVD, no carotid bruits; normal carotid upstroke Lungs: clear to ausculatation and percussion; no wheezing or rales Chest wall: without tenderness to palpitation Heart: PMI not displaced, RRR, s1 s2 normal, 1/6 systolic murmur, no diastolic murmur, no rubs, gallops, thrills, or heaves Abdomen: soft, nontender; no hepatosplenomehaly, BS+; abdominal aorta nontender and not dilated by palpation. Back: no CVA tenderness Pulses 2+ Musculoskeletal:  no joint deformities Extremities: no clubbing cyanosis or edema, Homan's sign negative  Neurologic: moves R side   Labs    Chemistry Recent Labs  Lab 07/05/20 0114 07/05/20 0114 07/06/20 0138 07/06/20 0138 07/07/20 0146 07/08/20 0139 07/09/20 0227  07/10/20 0317 07/11/20 0239  NA 145   < > 150*   < > 151*   < > 143 139 139  K 3.7   < > 3.7   < > 3.7   < > 3.7 3.7 3.8  CL 115*   < > 117*   < > 117*   < > 109 105 105  CO2 20*   < > 22   < > 24   < > '25 23 23  ' GLUCOSE 177*   < > 149*   < > 141*   < > 123* 121* 136*  BUN 56*   < > 58*   < > 57*   < > 54* 49* 42*  CREATININE 1.86*   < > 1.79*   < > 1.69*   < > 1.68* 1.50* 1.28*  CALCIUM 9.2   < > 9.4   < > 9.6   < > 8.9 8.9 9.1  PROT 5.7*  --  5.6*  --  5.8*  --   --   --   --   ALBUMIN 2.1*  --  2.1*  --  2.1*  --   --   --   --   AST 64*  --  52*  --  44*  --   --   --   --   ALT 75*  --  77*  --  70*  --   --   --   --   ALKPHOS 98  --  101  --  98  --   --   --   --   BILITOT 0.7  --  0.8  --  0.5  --   --   --   --   GFRNONAA 35*   < > 37*   < > 39*   < > 40* 46* 55*  GFRAA 41*   < > 43*   < > 46*   < > 46* 53* >60  ANIONGAP 10   < > 11   < > 10   < > '9 11 11   ' < > = values in this interval not displayed.     Hematology Recent Labs  Lab 07/09/20 0227 07/10/20 0317 07/11/20 0239  WBC 16.3* 18.5* 19.0*  RBC 3.76* 3.45* 3.27*  HGB 10.8* 10.2* 9.7*  HCT 34.8* 31.9* 30.1*  MCV 92.6 92.5 92.0  MCH 28.7 29.6 29.7  MCHC 31.0 32.0 32.2  RDW 14.7 14.5 14.2  PLT 297 327 374    Cardiac EnzymesNo results for input(s): TROPONINI in the last 168 hours. No results for input(s): TROPIPOC in the last 168  hours.   BNPNo results for input(s): BNP, PROBNP in the last 168 hours.   DDimer No results for input(s): DDIMER in the last 168 hours.   Lipid Panel     Component Value Date/Time   TRIG 86 07/09/2020 0227     Radiology    CT ABDOMEN WO CONTRAST  Result Date: 07/10/2020 CLINICAL DATA:  Cardiac arrest and anoxic encephalopathy. Evaluation for possible percutaneous gastrostomy tube placement. EXAM: CT ABDOMEN WITHOUT CONTRAST TECHNIQUE: Multidetector CT imaging of the abdomen was performed following the standard protocol without IV contrast. COMPARISON:  None. FINDINGS: Lower  chest: Mild bibasilar atelectasis. Hepatobiliary: Unenhanced appearance of the liver is unremarkable. The gallbladder is contracted. Pancreas: Unremarkable. No pancreatic ductal dilatation or surrounding inflammatory changes. Spleen: Normal in size without focal abnormality. Adrenals/Urinary Tract: Adrenal glands are unremarkable. Kidneys demonstrate bilateral cysts, no renal or hydronephrosis. Stomach/Bowel: Feeding tube extends into the stomach with the tip in the distal body. The stomach is relatively vertically oriented and high in position with a small amount of the body of the stomach projecting anteriorly between gas distended portions of a redundant transverse colon. Much of the distal stomach is located posterior to the transverse colon. No evidence of hiatal hernia. No evidence of small bowel dilatation or free intraperitoneal air. Vascular/Lymphatic: No significant vascular findings are present. No enlarged abdominal lymph nodes. Other: No ascites or focal fluid collections. No abdominal wall hernias. Musculoskeletal: No acute or significant osseous findings. IMPRESSION: 1. No acute findings in the abdomen. 2. The stomach is relatively vertically oriented and high in position with a small amount of the body of the stomach projecting anteriorly between gas distended portions of a redundant transverse colon. Most of the distal stomach is posterior to the transverse colon. This anatomy is not optimal for percutaneous gastrostomy tube placement with increased risk of colonic injury or perforation. Electronically Signed   By: Aletta Edouard M.D.   On: 07/10/2020 17:08    Cardiac Studies    ECHO: 06/30/2020 IMPRESSIONS  1. There are cystic structures noted in the liver. Dedicated RUQ  ultrasound is recommended for better evaluation.  2. Left ventricular ejection fraction, by estimation, is 25 to 30%. The  left ventricle has severely decreased function. The left ventricle  demonstrates global  hypokinesis. Left ventricular diastolic parameters are  consistent with Grade I diastolic  dysfunction (impaired relaxation).  3. Right ventricular systolic function is mildly reduced. The right  ventricular size is mildly enlarged. Tricuspid regurgitation signal is  inadequate for assessing PA pressure.  4. Left atrial size was moderately dilated.  5. The mitral valve is grossly normal. Trivial mitral valve  regurgitation.  6. The aortic valve is tricuspid. Aortic valve regurgitation is not  visualized. No aortic stenosis is present.   Patient Profile     74 y.o. male  admitted with a VF arrest and now with severe anoxic encephalopathy remains with VDRF  Assessment & Plan    1. DAY 12 S/P VF arrest:  No recurrent VF. 2. PAF: off amiodarone without recurrent AF; carvedilol was increased back to 18.75 yesterday.  Telemetry continues to show sinus rhythm in the 80s with occasional to frequent PACs.  We will further titrate Coreg to 25 mg twice a day today.   3. Cardiomyopathy: EF 25 - 30%. Hydralazine dc's with low BP when on propofol.   Blood pressure today in the 150s.  Further titrate carvedilol.  Will change lisinopril to losartan 25 mg twice a day. As long  as renal function remains stable probably institute low-dose spironolactone. If he improves neurologically, will ultimately need an ischemic evaluation with definitive right and left heart catheterization.  Will check CMP for tomorrow. 4. Klebsiella pneumonia: on cefazolin until 8/6. 5. VDRF: remain intubated; plan for trach today.  Also plan PEG tube insertion this afternoon 6. CKD: Cr 1.68 > 1.5 > 1.28 today 7. 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  Followed closely by neurology. 8. Ammonia 53, DC lactulose yesterday.   Signed, Troy Sine, MD, [facility] 07/11/2020, 8:26 AM

## 2020-07-11 NOTE — Progress Notes (Signed)
LTM maint complete - no skin breakdown under: Fp1 F3 F7 . Maintanance  O1 P3

## 2020-07-11 NOTE — Progress Notes (Signed)
NAME:  Christopher [name], MRN:  [id number], DOB:  26-Jan-1946, LOS: 68 ADMISSION DATE:  06/28/2020, CONSULTATION DATE:  07/11/20 REFERRING MD:  EDP, CHIEF COMPLAINT:  Cardiac arrest   Brief History   74 y.o. M with PMH of memory loss, HTN who had a witnessed arrest while sitting on the couch, 5 minutes down time without CPR.  EMS found patient in Vfib and pt was defibrillated 6 times and received 4 rounds EPI.  Intubated in the ED and PCCM consulted for admission  Pt was Intubated in the ED, CT head was negative for acute findings, he was initially hypotensive and started on Levophed.  Blood pressure improved and pressors were stopped and he became hypotensive.   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   Significant Hospital Events   7/25 Admit to PCCM 7/26 Being actively cooled on Arctic sun pads with goal normothermia 8/01 On vent, no change in mental status  Consults:  Cardiology  Procedures:  7/25 ETT >>  Significant Diagnostic Tests:  CT head 7/5 >> no acute findings, white matter disease, greatest in the left frontal lobe Echo 7/26 >> EF 25 to 30%, global hypokinesis, reduced RV SF MRI brain 7/29 >> no strong evidence of anoxic brain injury at this time, no acute intracranial abnormality, advanced chronic small vessel disease  Micro Data:  7/25 SARS-CoV-2 >> negative 7/30 Resp >> moderate klebsiella pneumoniae >> S-cefazolin   Antimicrobials:  Cefepime 7/30 >> 8/1  Cefazolin 8/1 >>   Interim history/subjective:  No events, remains poorly responsive on vent.  Objective   Blood pressure 138/79, pulse 80, temperature 98.2 F (36.8 C), temperature source Axillary, resp. rate 20, height 5\' 8"  (1.727 m), weight 81.9 kg, SpO2 97 %.    Vent Mode: PRVC FiO2 (%):  [50 %] 50 % Set Rate:  [16 bmp] 16 bmp Vt Set:   [550 mL-560 mL] 550 mL PEEP:  [5 cmH20] 5 cmH20 Plateau Pressure:  [17 cmH20-19 cmH20] 18 cmH20   Intake/Output Summary (Last 24 hours) at 07/11/2020 1029 Last data filed at 07/11/2020 0700 Gross per 24 hour  Intake 1531.64 ml  Output 2350 ml  Net -818.36 ml   Filed Weights   07/09/20 0500 07/10/20 0500 07/11/20 0451  Weight: 82.1 kg 82.9 kg 81.9 kg   GEN: ill appearing man on vent HEENT: ett in place, thick mucoid secretions, trachea midline, tongue bloody from biting it today CV: irregular, ext warm PULM: Scattered rhonci, triggering vent GI: protuberant, +BS EXT: no edema NEURO: responds to pain in all 4 ext mostly in RUE PSYCH: cannot assess SKIN: no rashes  EEG no seizures Ammonia mildly elevated WBC remains slightly elevated Cr improved CBG okay  Resolved Hospital Problem list      Assessment & Plan:   Acute Hypoxic Respiratory Failure in setting of Cardiac Arrest Klebsiella Pneumoniae PNA / Aspiration PNA -daily SBT but mental status barrier to extubation  -follow intermittent CXR  - 7 days total cefazolin (end date 8/6) - follow fever curve, WBC   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  Completed TTM protocol.  Last sedation 7/31. Neurology thinks there is reasonable chance of recovery over next couple months.  Family would like this trial with trach/PEG and  LTACH. Lurline Idol today then PS trials  Dysphagia- needs PEG at some point, will have to be open given anatomy on CT  Self inflicted tongue trauma, secretions- continue bite block for now, hopefully should improve after trach - Scopolamine patch trial  COPD  Tobacco abuse -continue duonebs   Acute Kidney Injury Various electrolyte -Trend BMP / urinary output -free water 100cc q 6h -Replace electrolytes as indicated -Avoid nephrotoxic agents, ensure adequate renal perfusion  Prediabetes Hgb A1c 6.1 -follow glucose trend  -SSI, moderate scale   Best practice:  Diet: tolerating tf Pain/Anxiety/Delirium protocol (if indicated): wean as able, will be easier with trach/PEG VAP protocol (if indicated): HOB  DVT prophylaxis: Heparin gtt GI prophylaxis: protonix Glucose control: SSI Mobility: bed rest Code Status: full code Family Communication: called brother 8/5, he agrees with plan Disposition: ICU  The patient is critically ill with multiple organ systems failure and requires high complexity decision making for assessment and support, frequent evaluation and titration of therapies, application of advanced monitoring technologies and extensive interpretation of multiple databases. Critical Care Time devoted to patient care services described in this note independent of APP/resident time (if applicable)  is 33 minutes.   Erskine Emery MD [facility] 07/11/2020 10:29 AM Personal pager: [phone] If unanswered, please page CCM On-call: [phone]
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
## 2020-07-11 NOTE — Progress Notes (Signed)
vLTM EEG complete. No skin breakdown 

## 2020-07-11 NOTE — Procedures (Signed)
Percutaneous Tracheostomy Procedure Note   Christopher [name]  [id number]  1946/06/03  Date:07/11/20  Time:11:42 AM   Provider Performing:Gisele Pack  Procedure: Percutaneous Tracheostomy with Bronchoscopic Guidance (31600)  Indication(s) Post Cardiac arrest, respiratory failure  Consent Risks of the procedure as well as the alternatives and risks of each were explained to the patient and/or caregiver.  Consent for the procedure was obtained.  Anesthesia Etomidate, Versed, Fentanyl, Vecuronium   Time Out Verified patient identification, verified procedure, site/side was marked, verified correct patient position, special equipment/implants available, medications/allergies/relevant history reviewed, required imaging and test results available.   Sterile Technique Maximal sterile technique including sterile barrier drape, hand hygiene, sterile gown, sterile gloves, mask, hair covering.    Procedure Description Appropriate anatomy identified by palpation.  Patient's neck prepped and draped in sterile fashion.  1% lidocaine with epinephrine was used to anesthetize skin overlying neck.  1.5cm incision made and blunt dissection performed until tracheal rings could be easily palpated.   Then a size 8 Shiley tracheostomy was placed under bronchoscopic visualization using usual Seldinger technique and serial dilation.   Bronchoscope confirmed placement above the carina.  Tracheostomy was sutured in place with adhesive pad to protect skin under pressure.    Patient connected to ventilator.   Complications/Tolerance None; patient tolerated the procedure well. Chest X-ray is ordered to confirm no post-procedural complication.   EBL Minimal   Specimen(s) None   Directly supervised by Dr. Ina Homes.

## 2020-07-11 NOTE — Progress Notes (Signed)
Late entry: Arrived to pt room to DC LTM EEG pt was in procedure and asked for RN to call when procedure is complete.

## 2020-07-11 NOTE — Progress Notes (Addendum)
Called ENT to review tongue injury.  No acute surgical interventions recommended.  ENT rec's for antibiotic coverage and to call back early next week if new needs arise.    Continue Ancef.  Monitor tongue, oral care.     Noe Gens, MSN, NP-C [facility] 07/11/2020, 1:45 PM   Please see [email or web address] for pager details.

## 2020-07-11 NOTE — Procedures (Signed)
Diagnostic Bronchoscopy  RUSHIL KIMBRELL  [id number]  1946/10/22  Date:07/11/20  Time:11:34 AM   Provider [place]   Procedure: Diagnostic Bronchoscopy [phone])  Indication(s) Assist with direct visualization of tracheostomy placement  Consent Risks of the procedure as well as the alternatives and risks of each were explained to the patient and/or caregiver.  Consent for the procedure was obtained.   Anesthesia See separate tracheostomy note   Time Out Verified patient identification, verified procedure, site/side was marked, verified correct patient position, special equipment/implants available, medications/allergies/relevant history reviewed, required imaging and test results available.   Sterile Technique Usual hand hygiene, masks, gowns, and gloves were used   Procedure Description Bronchoscope advanced through endotracheal tube and into airway.  After suctioning out tracheal secretions, bronchoscope used to provide direct visualization of tracheostomy placement.   Complications/Tolerance None; patient tolerated the procedure well.   EBL None  Specimen(s) None   Johnsie Cancel, NP-C [facility] Contact / Pager information can be found on Amion  07/11/2020, 11:34 AM

## 2020-07-11 NOTE — Procedures (Addendum)
Patient Name:Christopher [name] FEO:[id number] Epilepsy Attending:Claud Gowan Barbra Sarks Referring Physician/Provider:Dr Roland Rack Duration:07/10/2020 1500 to 07/11/2020 1314  Patient TGPQDIY:64BR M s/p cardiac arrest. EEG to evaluate for seizure  Level of alertness:awake/lethargic  AEDs during EEG study:None  Technical aspects: This EEG study was done with scalp electrodes positioned according to the 10-20 International system of electrode placement. Electrical activity was acquired at a sampling rate of 500Hz  and reviewed with a high frequency filter of 70Hz  and a low frequency filter of 1Hz . EEG data were recorded continuously and digitally stored.   Description:No clearposterior dominant rhythm was seen.EEG showed predominantly continuous generalized5-6Hz  theta slowing as well as intermittent 2-3Hz  delta slowing.Hyperventilation and photic stimulation were not performed.   ABNORMALITY -Continuousslow, generalized  IMPRESSION: This study is suggestive of severe diffuse encephalopathy, nonspecific etiology.No seizures or epileptiform discharges were seen throughout the recording.   Lylith Bebeau Barbra Sarks

## 2020-07-11 NOTE — Progress Notes (Signed)
IR requested by Dr. Tamala Julian for possible image-guided percutaneous gastrostomy tube placement.  CT abdomen/pelvis 07/10/2020 reviewed by Dr. Kathlene Cote who states anatomy not optimal for percutaneous gastrostomy tube placement (per CT report- The stomach is relatively vertically oriented and high in position with a small amount of the body of the stomach projecting anteriorly between gas distended portions of a redundant transverse colon. Most of the distal stomach is posterior to the transverse colon. This anatomy is not optimal for percutaneous gastrostomy tube placement with increased risk of colonic injury or perforation). Recommend surgical consult for gastrostomy tube placement. No plans for IR intervention at this time- will delete order. Will make Dr. Tamala Julian aware.  IR available in future if needed.   Bea Graff Bartley Vuolo, PA-C 07/11/2020, 10:53 AM

## 2020-07-12 LAB — MAGNESIUM: Magnesium: 2.1 mg/dL (ref 1.7–2.4)

## 2020-07-12 LAB — COMPREHENSIVE METABOLIC PANEL
ALT: 18 U/L (ref 0–44)
AST: 69 U/L — ABNORMAL HIGH (ref 15–41)
Albumin: 2.1 g/dL — ABNORMAL LOW (ref 3.5–5.0)
Alkaline Phosphatase: 118 U/L (ref 38–126)
Anion gap: 11 (ref 5–15)
BUN: 51 mg/dL — ABNORMAL HIGH (ref 8–23)
CO2: 23 mmol/L (ref 22–32)
Calcium: 9.1 mg/dL (ref 8.9–10.3)
Chloride: 106 mmol/L (ref 98–111)
Creatinine, Ser: 1.55 mg/dL — ABNORMAL HIGH (ref 0.61–1.24)
GFR calc Af Amer: 51 mL/min — ABNORMAL LOW (ref >60)
GFR calc non Af Amer: 44 mL/min — ABNORMAL LOW (ref >60)
Glucose, Bld: 165 mg/dL — ABNORMAL HIGH (ref 70–99)
Potassium: 4.2 mmol/L (ref 3.5–5.1)
Sodium: 140 mmol/L (ref 135–145)
Total Bilirubin: 0.5 mg/dL (ref 0.3–1.2)
Total Protein: 6.2 g/dL — ABNORMAL LOW (ref 6.5–8.1)

## 2020-07-12 LAB — HEPARIN LEVEL (UNFRACTIONATED): Heparin Unfractionated: 0.33 IU/mL (ref 0.30–0.70)

## 2020-07-12 LAB — GLUCOSE, CAPILLARY
Glucose-Capillary: 150 mg/dL — ABNORMAL HIGH (ref 70–99)
Glucose-Capillary: 156 mg/dL — ABNORMAL HIGH (ref 70–99)
Glucose-Capillary: 160 mg/dL — ABNORMAL HIGH (ref 70–99)
Glucose-Capillary: 167 mg/dL — ABNORMAL HIGH (ref 70–99)
Glucose-Capillary: 176 mg/dL — ABNORMAL HIGH (ref 70–99)
Glucose-Capillary: 176 mg/dL — ABNORMAL HIGH (ref 70–99)

## 2020-07-12 LAB — CBC
HCT: 29.7 % — ABNORMAL LOW (ref 39.0–52.0)
Hemoglobin: 9.3 g/dL — ABNORMAL LOW (ref 13.0–17.0)
MCH: 29.4 pg (ref 26.0–34.0)
MCHC: 31.3 g/dL (ref 30.0–36.0)
MCV: 94 fL (ref 80.0–100.0)
Platelets: 401 10*3/uL — ABNORMAL HIGH (ref 150–400)
RBC: 3.16 MIL/uL — ABNORMAL LOW (ref 4.22–5.81)
RDW: 14.1 % (ref 11.5–15.5)
WBC: 18.8 10*3/uL — ABNORMAL HIGH (ref 4.0–10.5)
nRBC: 0 % (ref 0.0–0.2)

## 2020-07-12 LAB — PHOSPHORUS: Phosphorus: 4.2 mg/dL (ref 2.5–4.6)

## 2020-07-12 MED ORDER — CEFAZOLIN SODIUM-DEXTROSE 2-4 GM/100ML-% IV SOLN
2.0000 g | Freq: Three times a day (TID) | INTRAVENOUS | Status: DC
  Administered 2020-07-12 – 2020-07-15 (×9): 2 g via INTRAVENOUS
  Filled 2020-07-12 (×16): qty 100

## 2020-07-12 NOTE — Consult Note (Signed)
ELECTROPHYSIOLOGY CONSULT NOTE    Primary Care Physician: Thurman Coyer, MD Referring Physician:  Dr Claiborne Billings  Admit Date: 06/28/2020  Reason for consultation:  VF arrest  TRUST LEH is a 74 y.o. male with a h/o COPD who was admitted with VF arrest. The patient had a cardiac arrest at home 06/28/20 while seated on the couch.  He was found to be in VF and required defibrillation x 6.  He had CPR for 15 minutes.   He has been in the ICU since that time.   His arrhythmias have been quiescent.  He did have a pause yesterday afternoon as well as frequent atrial ectopy but no further sustained ventricular arrhythmias. He has had persistent anoxic encephalopathy.  Neurology has been on board.  He has also been evaluated by palliative care.  He is now sp trach.  He also has had respiratory failure with klebsiella pneumonia. He had brief AF 07/08/2020 overnight and has been on heparin drip.    Past Medical History:  Diagnosis Date  . Colon polyp   . Hypertension   . 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 Migraine   . Mononucleosis 1990s  . Rash   . Shortness of breath ~1998   Pneumonia   . Skin moles    abnormal  . Wrist fracture    Childhood   Past Surgical History:  Procedure Laterality Date  . TRIGGER FINGER RELEASE Right     . acetaminophen (TYLENOL) oral liquid 160 mg/5 mL  650 mg Per Tube Q4H   Or  . acetaminophen  650 mg Rectal Q4H  . amoxicillin-clavulanate  875 mg Per Tube Q12H  . aspirin  81 mg Per Tube Daily  . atorvastatin  20 mg Per Tube Daily  . carvedilol  25 mg Per Tube BID WC  . chlorhexidine gluconate (MEDLINE KIT)  15 mL Mouth Rinse BID  . Chlorhexidine Gluconate Cloth  6 each Topical Daily  . docusate  100 mg Per Tube BID  . donepezil  5 mg Per Tube QHS  . free water  100 mL Per Tube Q6H  . furosemide  40 mg Oral Daily  . insulin aspart  0-15 Units Subcutaneous Q4H  . ipratropium-albuterol  3 mL Nebulization TID  . isosorbide dinitrate  30 mg Per Tube TID  .  lactulose  30 g Oral BID  . losartan  25 mg Per Tube BID  . mouth rinse  15 mL Mouth Rinse 10 times per day  . pantoprazole sodium  40 mg Per Tube Q1200  . polyethylene glycol  17 g Per Tube Daily  . scopolamine  1 patch Transdermal Q72H   . sodium chloride Stopped (07/11/20 0207)  . dexmedetomidine (PRECEDEX) IV infusion Stopped (07/09/20 1135)  . feeding supplement (VITAL AF 1.2 CAL) 65 mL/hr at 07/10/20 1800  . heparin 1,600 Units/hr (07/12/20 0800)    Allergies  Allergen Reactions  . Penicillins Rash    Social History   Socioeconomic History  . Marital status: Single    Spouse name: Not on file  . Number of children: 2  . Years of education: 77  . Highest education level: Not on file  Occupational History  . Occupation: N/A  Tobacco Use  . Smoking status: Current Every Day Smoker    Packs/day: 0.75  . Smokeless tobacco: Never Used  . Tobacco comment: 10/06/17 1 PPD  Substance and Sexual Activity  . Alcohol use: Yes    Comment: Occasional  . Drug use:  No  . Sexual activity: Not on file  Other Topics Concern  . Not on file  Social History Narrative   Lives at home w/ his daughter   Right-handed   Caffeine: 1 cup of coffee, 1 tea and 1 soda per day   Social Determinants of Health   Financial Resource Strain:   . Difficulty of Paying Living Expenses:   Food Insecurity:   . Worried About Charity fundraiser in the Last Year:   . Arboriculturist in the Last Year:   Transportation Needs:   . Film/video editor (Medical):   Marland Kitchen Lack of Transportation (Non-Medical):   Physical Activity:   . Days of Exercise per Week:   . Minutes of Exercise per Session:   Stress:   . Feeling of Stress :   Social Connections:   . Frequency of Communication with Friends and Family:   . Frequency of Social Gatherings with Friends and Family:   . Attends Religious Services:   . Active Member of Clubs or Organizations:   . Attends Archivist Meetings:   Marland Kitchen Marital  Status:   Intimate Partner Violence:   . Fear of Current or Ex-Partner:   . Emotionally Abused:   Marland Kitchen Physically Abused:   . Sexually Abused:     Family History  Problem Relation Age of Onset  . Heart disease Mother        Died at age 35  . Parkinson's disease Mother   . Parkinson's disease Brother     ROS-  Unable to provide Physical Exam: Telemetry: sinus with ectopy,  + pause yesterday afternoon Vitals:   07/12/20 0825 07/12/20 0826 07/12/20 0829 07/12/20 0830  BP: (!) 128/48   (!) 108/45  Pulse: 69   72  Resp: (!) 21   (!) 25  Temp:      TempSrc:      SpO2: 98% 98% 98% 97%  Weight:      Height:        GEN- The patient is ill appearing, responds to pain   Head- normocephalic, atraumatic Tach in placce Lungs- on vent Heart- Regular rate and rhythm  GI- soft  Extremities- + dependant dema MS- no significant deformity or atrophy Skin- no rash or lesion Psych- unable to assess  ekg 06/29/20- sinus with bifascicular block ekg 06/30/20- sinus with markedly prolonged qt and twi EKG 07/11/20- sinus with PACs, diffuse TWI  Labs:   Lab Results  Component Value Date   WBC 18.8 (H) 07/12/2020   HGB 9.3 (L) 07/12/2020   HCT 29.7 (L) 07/12/2020   MCV 94.0 07/12/2020   PLT 401 (H) 07/12/2020    Recent Labs  Lab 07/12/20 0033  NA 140  K 4.2  CL 106  CO2 23  BUN 51*  CREATININE 1.55*  CALCIUM 9.1  PROT 6.2*  BILITOT 0.5  ALKPHOS 118  ALT 18  AST 69*  GLUCOSE 165*   No results found for: CKTOTAL, CKMB, CKMBINDEX, TROPONINI No results found for: CHOL No results found for: HDL No results found for: [facility] Lab Results  Component Value Date   TRIG 86 07/09/2020   Echo 06/30/20:  EF 25-30%, severe global dysfunction, moderaet LA enlargement   Patient Profile     74 y.o. male admitted with a VF arrest and now with severe anoxic encephalopathy remains with VDRF   ASSESSMENT AND PLAN:   1. VF arrest Continues to have anoxic encephalopathy Prognosis  remains guarded No  further arrhythmias off amiodarone Initially had bifascicular block followed by sinus with marked qt prolongation (possibly related to cooling) Keep K > 3.9, Mg >1.9 If he makes meaningful recovery next step would be cath. Not currently a cath candidate Not currently an ICD candidate Prognosis is guarded at best  2. Cardiomyopathy Possibly due to cardiac arrest.  Will need to reassess EF prior to discharge Could consider cath if he makes meaningful recovery Continue to titrate CHF medicines as renal function allows  3. Pause He had a sinus pause yesterday, possibly due to vagal event Continue to monitor Could consider reducing coreg if these continue No indication for pacing currently  4. Acute on chronic renal failure Continue to follow closely  5. Anoxic encephalopathy Severe, diffuse, no seizures, followed by neuro Prognosis is guarded  6. afib Likely due to acute illness I am not convinced that he will require long term anticoagulation unless this recurs.  Cardiology to follow EP to see again prior to discharge if mentation continues to improve   Thompson Grayer, MD 07/12/2020  9:09 AM

## 2020-07-12 NOTE — Progress Notes (Signed)
[place] for IV Heparin Indication: r/o ACS s/p cardiac arrest, possible afib  Allergies  Allergen Reactions  . Penicillins Rash    Patient Measurements: Height: 5\' 8"  (172.7 cm) Weight: 81.9 kg (180 lb 8.9 oz) IBW/kg (Calculated) : 68.4  Vital Signs: Temp: 98.9 F (37.2 C) (08/06 2342) Temp Source: Axillary (08/06 2342) BP: 104/55 (08/07 0006) Pulse Rate: 91 (08/07 0006)  Labs: Recent Labs    07/09/20 0227 07/09/20 0227 07/10/20 0317 07/10/20 0317 07/11/20 0239 07/12/20 0033  HGB 10.8*   < > 10.2*   < > 9.7* 9.3*  HCT 34.8*   < > 31.9*  --  30.1* 29.7*  PLT 297   < > 327  --  374 401*  APTT  --   --   --   --  54*  --   LABPROT  --   --   --   --  13.6  --   INR  --   --   --   --  1.1  --   HEPARINUNFRC 0.50  --  0.43  --   --  0.33  CREATININE 1.68*   < > 1.50*  --  1.28* 1.55*   < > = values in this interval not displayed.    Estimated Creatinine Clearance: 41.1 mL/min (A) (by C-G formula based on SCr of 1.55 mg/dL (H)).   Assessment: 74 yr old male had witnessed arrest at home, EMS found pt to be in Vfib and gave shocks, epi, and amiodarone with ROSC, now w/ troponin elevated, also with Afib, on IV heparin.  Heparin level therapeutic (0.33) on gtt at 1600 units/hr. No issues noted.   Goal of Therapy:  Heparin level 0.3-0.7 units/ml Monitor platelets by anticoagulation protocol: Yes   Plan:  Continue heparin infusion at 1600 units/hr Monitor daily HL, CBC, and s/sx of bleeding  F/u transition to [place] after PEG placement  Sherlon Handing, PharmD, BCPS Please see amion for complete clinical pharmacist phone list 07/12/2020  1:52 AM

## 2020-07-12 NOTE — Progress Notes (Signed)
[place] for IV Heparin Indication: r/o ACS s/p cardiac arrest, afib  Allergies  Allergen Reactions  . Penicillins Rash    Patient Measurements: Height: 5\' 8"  (172.7 cm) Weight: 82.5 kg (181 lb 14.1 oz) IBW/kg (Calculated) : 68.4  Vital Signs: Temp: 100.9 F (38.3 C) (08/07 0816) Temp Source: Axillary (08/07 0400) BP: 108/45 (08/07 0830) Pulse Rate: 72 (08/07 0830)  Labs: Recent Labs    07/10/20 0317 07/10/20 0317 07/11/20 0239 07/12/20 0033  HGB 10.2*   < > 9.7* 9.3*  HCT 31.9*  --  30.1* 29.7*  PLT 327  --  374 401*  APTT  --   --  54*  --   LABPROT  --   --  13.6  --   INR  --   --  1.1  --   HEPARINUNFRC 0.43  --   --  0.33  CREATININE 1.50*  --  1.28* 1.55*   < > = values in this interval not displayed.    Estimated Creatinine Clearance: 44.4 mL/min (A) (by C-G formula based on SCr of 1.55 mg/dL (H)).   Assessment: 74 yr old male had witnessed arrest at home, EMS found pt to be in Vfib and gave shocks, epi, and amiodarone with ROSC, now w/ troponin elevated, also with Afib, on IV heparin.  Heparin was turned off 8/6 for trach placement then resumed later in day. Patient now s/p trach placement with minimal blood loss Heparin drip rate 1600 units/hr HL 0.33 at goal. CBC stable. No overt bleeding.   Goal of Therapy:  Heparin level 0.3-0.7 units/ml Monitor platelets by anticoagulation protocol: Yes   Plan:  Restart heparin infusion at 1600 units/hr Monitor daily HL, CBC, and s/sx of bleeding  F/u transition to [place] after PEG placement  Bonnita Nasuti Pharm.D. CPP, BCPS Clinical Pharmacist [phone] 07/12/2020 9:39 AM    Please check [email or web address] for unit-specific pharmacy phone numbers.

## 2020-07-12 NOTE — Progress Notes (Signed)
PT Cancellation Note  Patient Details Name: Christopher [name] MRN: [id number] DOB: 1946/03/03   Cancelled Treatment:    Reason Eval/Treat Not Completed: Patient not medically ready. Automatic PT generated by trach placement. Currently patient not responsive. Will sign off. Re-order if/when appropriate.   [place] 07/12/2020, 11:02 AM [place] Pager [phone] Office [phone]

## 2020-07-12 NOTE — Progress Notes (Signed)
No seizures on EEG. Neurology will be availabel on an as needed basis going forward. Please call with questions.   Roland Rack, MD [facility] [phone]  If 7pm- 7am, please page neurology on call as listed in [place].

## 2020-07-12 NOTE — Evaluation (Signed)
SLP Cancellation Note  Patient Details Name: ORHAN MAYORGA MRN: [id number] DOB: 11-13-1946   Cancelled treatment:       Reason Eval/Treat Not Completed: Other (comment) (pt is lethargic, per RN responds to painful stimuli only, will follow up Monday to determine if ready for evals, thank you.)  Kathleen Lime, MS [facility] SLP Acute Rehab Services Office [phone]  Macario Golds 07/12/2020, 8:25 AM

## 2020-07-12 NOTE — Progress Notes (Signed)
NAME:  Christopher [name], MRN:  [id number], DOB:  March 04, 1946, LOS: 49 ADMISSION DATE:  06/28/2020, CONSULTATION DATE:  07/12/20 REFERRING MD:  EDP, CHIEF COMPLAINT:  Cardiac arrest   Brief History   74 y.o. M with PMH of memory loss, HTN who had a witnessed arrest while sitting on the couch, 5 minutes down time without CPR.  EMS found patient in Vfib and pt was defibrillated 6 times and received 4 rounds EPI.  Intubated in the ED and PCCM consulted for admission  Pt was Intubated in the ED, CT head was negative for acute findings, he was initially hypotensive and started on Levophed.  Blood pressure improved and pressors were stopped and he became hypotensive.   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   Significant Hospital Events   7/25 Admit to PCCM 7/26 Being actively cooled on Arctic sun pads with goal normothermia 8/01 On vent, no change in mental status  Consults:  Cardiology  Procedures:  7/25 ETT >>  Significant Diagnostic Tests:  CT head 7/5 >> no acute findings, white matter disease, greatest in the left frontal lobe Echo 7/26 >> EF 25 to 30%, global hypokinesis, reduced RV SF MRI brain 7/29 >> no strong evidence of anoxic brain injury at this time, no acute intracranial abnormality, advanced chronic small vessel disease  Micro Data:  7/25 SARS-CoV-2 >> negative 7/30 Resp >> moderate klebsiella pneumoniae >> S-cefazolin   Antimicrobials:  Cefepime 7/30 >> 8/1  Cefazolin 8/1 >>   Interim history/subjective:  No events, remains poorly responsive on vent.  Objective   Blood pressure (!) 114/47, pulse 75, temperature 100.2 F (37.9 C), resp. rate 19, height 5\' 8"  (1.727 m), weight 82.5 kg, SpO2 96 %.    Vent Mode: PRVC FiO2 (%):  [50 %] 50 % Set Rate:  [16 bmp] 16 bmp Vt Set:  [550 mL] 550 mL PEEP:   [5 cmH20] 5 cmH20 Pressure Support:  [14 cmH20] 14 cmH20 Plateau Pressure:  [17 cmH20-19 cmH20] 19 cmH20   Intake/Output Summary (Last 24 hours) at 07/12/2020 1334 Last data filed at 07/12/2020 1300 Gross per 24 hour  Intake 1618.81 ml  Output 2300 ml  Net -681.19 ml   Filed Weights   07/10/20 0500 07/11/20 0451 07/12/20 0418  Weight: 82.9 kg 81.9 kg 82.5 kg   GEN: ill appearing man on vent HEENT: tongue with areas of necrosis, no further active bleeding,  CV: irregular, ext warm PULM: Scattered rhonci, triggering vent GI: protuberant, +BS EXT: no edema NEURO: responds to pain in all 4 ext mostly in RUE PSYCH: cannot assess SKIN: no rashes  CBC stable CMP stable CBGs okay   Resolved Hospital Problem list    Klebsiella Pneumoniae PNA / Aspiration PNA- 7 days total cefazolin (end date 8/6)  Assessment & Plan:   Acute Hypoxic Respiratory Failure in setting of Cardiac Arrest - s/p trach - trach sutures out 8/11 - PS trial today, potentially TC trial  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  Completed TTM protocol.  Last sedation 7/31. Neurology thinks there is reasonable chance of recovery over next couple months.  Family would like this trial with trach/PEG and LTACH. - Limit sedatives  Dysphagia- needs PEG at some point, will have to be open given anatomy on CT -  General surgery consult  Self inflicted tongue trauma, secretions- continue bite block for now, hopefully should improve after trach - Scopolamine patch trial - Cefazolin for a few more days for infection ppx - ENT does not think anything can be done surgically  COPD  Tobacco abuse -continue duonebs   Acute Kidney Injury Various electrolyte -Trend BMP / urinary output -free water 100cc q 6h -Replace electrolytes as indicated -Avoid nephrotoxic agents, ensure adequate renal perfusion  Prediabetes Hgb A1c 6.1 -follow glucose trend  -SSI, moderate scale   Best practice:  Diet: tolerating tf Pain/Anxiety/Delirium protocol (if indicated): limit as able VAP protocol (if indicated): HOB  DVT prophylaxis: Heparin gtt GI prophylaxis: protonix Glucose control: SSI Mobility: bed rest Code Status: full code Family Communication: updated family at bedside Disposition: ICU, can start working toward [facility] placement  The patient is critically ill with multiple organ systems failure and requires high complexity decision making for assessment and support, frequent evaluation and titration of therapies, application of advanced monitoring technologies and extensive interpretation of multiple databases. Critical Care Time devoted to patient care services described in this note independent of APP/resident time (if applicable)  is 32 minutes.   Erskine Emery MD [facility] 07/12/2020 1:34 PM Personal pager: [phone] If unanswered, please page CCM On-call: [phone]

## 2020-07-13 ENCOUNTER — Inpatient Hospital Stay (HOSPITAL_COMMUNITY): Payer: Medicare Other

## 2020-07-13 LAB — CBC
HCT: 27.5 % — ABNORMAL LOW (ref 39.0–52.0)
Hemoglobin: 8.8 g/dL — ABNORMAL LOW (ref 13.0–17.0)
MCH: 30 pg (ref 26.0–34.0)
MCHC: 32 g/dL (ref 30.0–36.0)
MCV: 93.9 fL (ref 80.0–100.0)
Platelets: 441 10*3/uL — ABNORMAL HIGH (ref 150–400)
RBC: 2.93 MIL/uL — ABNORMAL LOW (ref 4.22–5.81)
RDW: 14.3 % (ref 11.5–15.5)
WBC: 22.4 10*3/uL — ABNORMAL HIGH (ref 4.0–10.5)
nRBC: 0 % (ref 0.0–0.2)

## 2020-07-13 LAB — GLUCOSE, CAPILLARY
Glucose-Capillary: 156 mg/dL — ABNORMAL HIGH (ref 70–99)
Glucose-Capillary: 154 mg/dL — ABNORMAL HIGH (ref 70–99)
Glucose-Capillary: 179 mg/dL — ABNORMAL HIGH (ref 70–99)
Glucose-Capillary: 147 mg/dL — ABNORMAL HIGH (ref 70–99)
Glucose-Capillary: 142 mg/dL — ABNORMAL HIGH (ref 70–99)

## 2020-07-13 LAB — BASIC METABOLIC PANEL
Anion gap: 14 (ref 5–15)
BUN: 64 mg/dL — ABNORMAL HIGH (ref 8–23)
CO2: 21 mmol/L — ABNORMAL LOW (ref 22–32)
Calcium: 9 mg/dL (ref 8.9–10.3)
Chloride: 106 mmol/L (ref 98–111)
Creatinine, Ser: 1.78 mg/dL — ABNORMAL HIGH (ref 0.61–1.24)
GFR calc Af Amer: 43 mL/min — ABNORMAL LOW (ref >60)
GFR calc non Af Amer: 37 mL/min — ABNORMAL LOW (ref >60)
Glucose, Bld: 169 mg/dL — ABNORMAL HIGH (ref 70–99)
Potassium: 3.8 mmol/L (ref 3.5–5.1)
Sodium: 141 mmol/L (ref 135–145)

## 2020-07-13 LAB — HEPARIN LEVEL (UNFRACTIONATED): Heparin Unfractionated: 0.34 IU/mL (ref 0.30–0.70)

## 2020-07-13 LAB — PROCALCITONIN: Procalcitonin: 0.27 ng/mL

## 2020-07-13 LAB — AMMONIA: Ammonia: 44 umol/L — ABNORMAL HIGH (ref 9–35)

## 2020-07-13 MED ORDER — HEPARIN SODIUM (PORCINE) 5000 UNIT/ML IJ SOLN
5000.0000 [IU] | Freq: Three times a day (TID) | INTRAMUSCULAR | Status: DC
  Administered 2020-07-13 – 2020-07-16 (×9): 5000 [IU] via SUBCUTANEOUS
  Filled 2020-07-13 (×9): qty 1

## 2020-07-13 MED ORDER — CHLORHEXIDINE GLUCONATE 0.12 % MT SOLN
OROMUCOSAL | Status: AC
  Administered 2020-07-13: 15 mL via OROMUCOSAL
  Filled 2020-07-13: qty 15

## 2020-07-13 NOTE — Progress Notes (Signed)
Progress Note   Subjective   Remains unresponsive except to pain  Inpatient Medications    Scheduled Meds: . acetaminophen (TYLENOL) oral liquid 160 mg/5 mL  650 mg Per Tube Q4H   Or  . acetaminophen  650 mg Rectal Q4H  . aspirin  81 mg Per Tube Daily  . atorvastatin  20 mg Per Tube Daily  . carvedilol  25 mg Per Tube BID WC  . chlorhexidine gluconate (MEDLINE KIT)  15 mL Mouth Rinse BID  . Chlorhexidine Gluconate Cloth  6 each Topical Daily  . docusate  100 mg Per Tube BID  . donepezil  5 mg Per Tube QHS  . free water  100 mL Per Tube Q6H  . furosemide  40 mg Oral Daily  . insulin aspart  0-15 Units Subcutaneous Q4H  . ipratropium-albuterol  3 mL Nebulization TID  . isosorbide dinitrate  30 mg Per Tube TID  . lactulose  30 g Oral BID  . losartan  25 mg Per Tube BID  . mouth rinse  15 mL Mouth Rinse 10 times per day  . pantoprazole sodium  40 mg Per Tube Q1200  . polyethylene glycol  17 g Per Tube Daily  . scopolamine  1 patch Transdermal Q72H   Continuous Infusions: . sodium chloride Stopped (07/11/20 0207)  .  ceFAZolin (ANCEF) IV Stopped (07/13/20 0606)  . feeding supplement (VITAL AF 1.2 CAL) 65 mL/hr at 07/13/20 0100  . heparin 1,600 Units/hr (07/13/20 0800)   PRN Meds: acetaminophen, fentaNYL (SUBLIMAZE) injection, labetalol, pneumococcal 23 valent vaccine, polyethylene glycol   Vital Signs    Vitals:   07/13/20 0749 07/13/20 0750 07/13/20 0800 07/13/20 0900  BP:   (!) 123/42 (!) 125/46  Pulse:   (!) 110 78  Resp:   (!) 27 (!) 24  Temp:      TempSrc:      SpO2: 100% 100% 100% 100%  Weight:      Height:        Intake/Output Summary (Last 24 hours) at 07/13/2020 0951 Last data filed at 07/13/2020 0900 Gross per 24 hour  Intake 2643.92 ml  Output 3200 ml  Net -556.08 ml   Filed Weights   07/11/20 0451 07/12/20 0418 07/13/20 0500  Weight: 81.9 kg 82.5 kg 80.1 kg    Telemetry    Sinus with frequent atrial ectopy, rare pvcs, no sustained  arrhythmias - Personally Reviewed  Physical Exam   GEN- The patient is ill appearing, only responds to pain  Head- normocephalic, atraumatic Trach collar Lungs-  On vent Heart- Regular rate and rhythm  GI- soft  Extremities- no clubbing, cyanosis, + dependant edema  MS- no significant deformity or atrophy Skin- no rash or lesion Psych- unable to assess Neuro- not responsive   Labs    Chemistry Recent Labs  Lab 07/07/20 0146 07/08/20 0139 07/11/20 0239 07/12/20 0033 07/13/20 0207  NA 151*   < > 139 140 141  K 3.7   < > 3.8 4.2 3.8  CL 117*   < > 105 106 106  CO2 24   < > 23 23 21*  GLUCOSE 141*   < > 136* 165* 169*  BUN 57*   < > 42* 51* 64*  CREATININE 1.69*   < > 1.28* 1.55* 1.78*  CALCIUM 9.6   < > 9.1 9.1 9.0  PROT 5.8*  --   --  6.2*  --   ALBUMIN 2.1*  --   --  2.1*  --  AST 44*  --   --  69*  --   ALT 70*  --   --  18  --   ALKPHOS 98  --   --  118  --   BILITOT 0.5  --   --  0.5  --   GFRNONAA 39*   < > 55* 44* 37*  GFRAA 46*   < > >60 51* 43*  ANIONGAP 10   < > '11 11 14   ' < > = values in this interval not displayed.     Hematology Recent Labs  Lab 07/11/20 0239 07/12/20 0033 07/13/20 0207  WBC 19.0* 18.8* 22.4*  RBC 3.27* 3.16* 2.93*  HGB 9.7* 9.3* 8.8*  HCT 30.1* 29.7* 27.5*  MCV 92.0 94.0 93.9  MCH 29.7 29.4 30.0  MCHC 32.2 31.3 32.0  RDW 14.2 14.1 14.3  PLT 374 401* 441*     Patient ID  [id number] y.o.maleadmitted with a VF arrest and now with severe anoxic encephalopathy remains with VDRF   Assessment & Plan    1.  VF arrest S/p defibrillation in the field with at least 15 minute downtime Anoxic brain injury persists Prognosis is guarded Consider cath if he makes meaningful recovery Ep to see post cath  2. Cardiomyopathy Possibly due to cardiac arrest Reassess EF prior to discharge Cath if he wakes up Continue coreg and losartan Increase losartan once creatinine stabilizes  3. Pause Nocturnal pause noted 48 hours ago, likely  vagal No indication for pacing currently  4. Acute on chronic renal failure No changes  5. afib Likely due to acute medical illness I am not convinced that he warrants long term anticoagulation unless if afib recurs   6. Anoxic brain injury Prognosis is guarded  General cardiology to follow  Thompson Grayer MD, [facility] 07/13/2020 9:51 AM

## 2020-07-13 NOTE — Progress Notes (Signed)
NAME:  Christopher [name], MRN:  [id number], DOB:  [birth year], LOS: 9 ADMISSION DATE:  06/28/2020, CONSULTATION DATE:  07/13/20 REFERRING MD:  EDP, CHIEF COMPLAINT:  Cardiac arrest   Brief History   74 y.o. M with PMH of memory loss, HTN who had a witnessed arrest while sitting on the couch, 5 minutes down time without CPR.  EMS found patient in Vfib and pt was defibrillated 6 times and received 4 rounds EPI.  Intubated in the ED and PCCM consulted for admission  Pt was Intubated in the ED, CT head was negative for acute findings, he was initially hypotensive and started on Levophed.  Blood pressure improved and pressors were stopped and he became hypotensive.   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   Significant Hospital Events   7/25 Admit to PCCM 7/26 Being actively cooled on Arctic sun pads with goal normothermia 8/01 On vent, no change in mental status  Consults:  Cardiology  Procedures:  7/25 ETT >>  Significant Diagnostic Tests:  CT head 7/5 >> no acute findings, white matter disease, greatest in the left frontal lobe Echo 7/26 >> EF 25 to 30%, global hypokinesis, reduced RV SF MRI brain 7/29 >> no strong evidence of anoxic brain injury at this time, no acute intracranial abnormality, advanced chronic small vessel disease  Micro Data:  7/25 SARS-CoV-2 >> negative 7/30 Resp >> moderate klebsiella pneumoniae >> S-cefazolin   Antimicrobials:  Cefepime 7/30 >> 8/1  Cefazolin 8/1 >>   Interim history/subjective:  No events, remains poorly responsive on vent.  Objective   Blood pressure (!) 125/46, pulse 78, temperature (!) 100.7 F (38.2 C), temperature source Oral, resp. rate (!) 24, height 5\' 8"  (1.727 m), weight 80.1 kg, SpO2 100 %.    Vent Mode: PRVC FiO2 (%):  [40 %-70 %] 60 % Set Rate:  [16 bmp]  16 bmp Vt Set:  [550 mL] 550 mL PEEP:  [5 cmH20] 5 cmH20 Plateau Pressure:  [14 cmH20-20 cmH20] 15 cmH20   Intake/Output Summary (Last 24 hours) at 07/13/2020 1007 Last data filed at 07/13/2020 0900 Gross per 24 hour  Intake 2643.92 ml  Output 3200 ml  Net -556.08 ml   Filed Weights   07/11/20 0451 07/12/20 0418 07/13/20 0500  Weight: 81.9 kg 82.5 kg 80.1 kg   GEN: ill appearing man on vent HEENT: tongue with areas of necrosis, no further active bleeding, trach has some oozing today CV: irregular due to atrial ectopy, ext warm PULM: Scattered rhonci, triggering vent GI: protuberant, +BS EXT: no edema NEURO: responds to pain with wincing, almost purposeful movement on R side, left side mild flexion, unchanged PSYCH: cannot assess SKIN: no rashes  WBC up for some reason Hgb, plts stable Cr a little worse Net -300 yesterday  Resolved Hospital Problem list    Klebsiella Pneumoniae PNA / Aspiration PNA- 7 days total cefazolin (end date 8/6)  Assessment & Plan:   Acute Hypoxic Respiratory Failure in setting of Cardiac Arrest - s/p trach - trach sutures out 8/11 - PS trial today, potentially TC trial if tolerates well  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  Completed TTM protocol.  Last sedation 7/31. Neurology thinks there is reasonable  chance of recovery over next couple months.  Family would like this trial with trach/PEG and LTACH. - Limit sedatives  Dysphagia- needs PEG at some point, will have to be open given anatomy on CT - General surgery consult  Self inflicted tongue trauma, secretions- continue bite block for now, hopefully should improve after trach - Scopolamine patch trial - Cefazolin for a few more days for infection ppx - ENT does not think anything can be done surgically  COPD  Tobacco abuse -continue duonebs   Acute Kidney Injury Various electrolyte -Trend BMP / urinary output -free water 100cc q 6h -Replace electrolytes as indicated -Avoid nephrotoxic agents, ensure adequate renal perfusion -8/8: hold lasix today  Prediabetes Hgb A1c 6.1 -follow glucose trend  -SSI, moderate scale   8/8 new low grade fever, rising WBC, secretions- check CXR, trach aspirate, Pct, low threshold for HCAP coverage if any HD perturbations  Best practice:  Diet: tolerating tf Pain/Anxiety/Delirium protocol (if indicated): limit as able VAP protocol (if indicated): HOB  DVT prophylaxis: Heparin subQ GI prophylaxis: protonix Glucose control: SSI Mobility: bed rest Code Status: full code Family Communication: updated family at bedside Disposition: ICU, can start working toward [facility] placement  The patient is critically ill with multiple organ systems failure and requires high complexity decision making for assessment and support, frequent evaluation and titration of therapies, application of advanced monitoring technologies and extensive interpretation of multiple databases. Critical Care Time devoted to patient care services described in this note independent of APP/resident time (if applicable)  is 35 minutes.   Erskine Emery MD [facility] 07/13/2020 10:07 AM Personal pager: [phone] If unanswered, please page CCM On-call: [phone]

## 2020-07-13 NOTE — Progress Notes (Signed)
[place] for IV Heparin Indication: r/o ACS s/p cardiac arrest, afib  Allergies  Allergen Reactions  . Penicillins Rash    Patient Measurements: Height: 5\' 8"  (172.7 cm) Weight: 80.1 kg (176 lb 9.4 oz) IBW/kg (Calculated) : 68.4  Vital Signs: Temp: 100.3 F (37.9 C) (08/08 1151) Temp Source: Oral (08/08 1151) BP: 111/60 (08/08 1418) Pulse Rate: 66 (08/08 1418)  Labs: Recent Labs    07/11/20 0239 07/11/20 0239 07/12/20 0033 07/13/20 0207  HGB 9.7*   < > 9.3* 8.8*  HCT 30.1*  --  29.7* 27.5*  PLT 374  --  401* 441*  APTT 54*  --   --   --   LABPROT 13.6  --   --   --   INR 1.1  --   --   --   HEPARINUNFRC  --   --  0.33 0.34  CREATININE 1.28*  --  1.55* 1.78*   < > = values in this interval not displayed.    Estimated Creatinine Clearance: 35.8 mL/min (A) (by C-G formula based on SCr of 1.78 mg/dL (H)).   Assessment: 74 yr old male had witnessed arrest at home, EMS found pt to be in Vfib and gave shocks, epi, and amiodarone with ROSC, now w/ troponin elevated, also with Afib, on IV heparin.  Heparin was turned off 8/6 for trach placement then resumed later in day. Patient now s/p trach placement with minimal blood loss Heparin drip rate 1600 units/hr HL 0.34 at goal. CBC stable. No overt bleeding. Patient has been in [place] per cards no need for long term AC> stop heparin drip  Goal of Therapy:  Heparin level 0.3-0.7 units/ml Monitor platelets by anticoagulation protocol: Yes   Plan:  Stop heparin drip  Heparin sq for VTE px   Bonnita Nasuti Pharm.D. CPP, BCPS Clinical Pharmacist [phone] 07/13/2020 2:31 PM    Please check [email or web address] for unit-specific pharmacy phone numbers.

## 2020-07-13 NOTE — Progress Notes (Signed)
[facility] Progress Note Patient Name: LEMONTE AL DOB: 04-05-1946 MRN: [id number]   Date of Service  07/13/2020  HPI/Events of Note  117/45. HR 65.   eICU Interventions  Do not give scheduled isordil tonight. Ok to give cozzar low dose 25 mg.      Intervention Category Intermediate Interventions: Hypotension - evaluation and management  Elmer Sow 07/13/2020, 11:03 PM

## 2020-07-14 ENCOUNTER — Inpatient Hospital Stay (HOSPITAL_COMMUNITY): Payer: Medicare Other | Admitting: Certified Registered Nurse Anesthetist

## 2020-07-14 ENCOUNTER — Encounter (HOSPITAL_COMMUNITY): Payer: Self-pay | Admitting: Pulmonary Disease

## 2020-07-14 ENCOUNTER — Encounter (HOSPITAL_COMMUNITY)
Admission: EM | Disposition: A | Discharge status: Nursing Home (Includes ICF) | Payer: Self-pay | Source: Home / Self Care | Attending: Internal Medicine

## 2020-07-14 HISTORY — PX: PEG PLACEMENT: SHX5437

## 2020-07-14 LAB — CBC
HCT: 26.1 % — ABNORMAL LOW (ref 39.0–52.0)
Hemoglobin: 8.2 g/dL — ABNORMAL LOW (ref 13.0–17.0)
MCH: 29.9 pg (ref 26.0–34.0)
MCHC: 31.4 g/dL (ref 30.0–36.0)
MCV: 95.3 fL (ref 80.0–100.0)
Platelets: 419 10*3/uL — ABNORMAL HIGH (ref 150–400)
RBC: 2.74 MIL/uL — ABNORMAL LOW (ref 4.22–5.81)
RDW: 14.6 % (ref 11.5–15.5)
WBC: 21.8 10*3/uL — ABNORMAL HIGH (ref 4.0–10.5)
nRBC: 0 % (ref 0.0–0.2)

## 2020-07-14 LAB — GLUCOSE, CAPILLARY
Glucose-Capillary: 116 mg/dL — ABNORMAL HIGH (ref 70–99)
Glucose-Capillary: 130 mg/dL — ABNORMAL HIGH (ref 70–99)
Glucose-Capillary: 130 mg/dL — ABNORMAL HIGH (ref 70–99)
Glucose-Capillary: 131 mg/dL — ABNORMAL HIGH (ref 70–99)
Glucose-Capillary: 143 mg/dL — ABNORMAL HIGH (ref 70–99)
Glucose-Capillary: 173 mg/dL — ABNORMAL HIGH (ref 70–99)
Glucose-Capillary: 184 mg/dL — ABNORMAL HIGH (ref 70–99)

## 2020-07-14 LAB — BASIC METABOLIC PANEL
Anion gap: 11 (ref 5–15)
BUN: 75 mg/dL — ABNORMAL HIGH (ref 8–23)
CO2: 22 mmol/L (ref 22–32)
Calcium: 9.1 mg/dL (ref 8.9–10.3)
Chloride: 109 mmol/L (ref 98–111)
Creatinine, Ser: 2.14 mg/dL — ABNORMAL HIGH (ref 0.61–1.24)
GFR calc Af Amer: 34 mL/min — ABNORMAL LOW (ref >60)
GFR calc non Af Amer: 29 mL/min — ABNORMAL LOW (ref >60)
Glucose, Bld: 161 mg/dL — ABNORMAL HIGH (ref 70–99)
Potassium: 3.8 mmol/L (ref 3.5–5.1)
Sodium: 142 mmol/L (ref 135–145)

## 2020-07-14 LAB — MAGNESIUM: Magnesium: 2.4 mg/dL (ref 1.7–2.4)

## 2020-07-14 LAB — PHOSPHORUS: Phosphorus: 4.5 mg/dL (ref 2.5–4.6)

## 2020-07-14 SURGERY — INSERTION, PEG TUBE
Anesthesia: General | Site: Abdomen

## 2020-07-14 MED ORDER — LACTATED RINGERS IV SOLN: INTRAVENOUS | Status: DC

## 2020-07-14 MED ORDER — PROPOFOL 10 MG/ML IV BOLUS
INTRAVENOUS | Status: AC
  Filled 2020-07-14: qty 20

## 2020-07-14 MED ORDER — DEXAMETHASONE SODIUM PHOSPHATE 10 MG/ML IJ SOLN
INTRAMUSCULAR | Status: AC
  Filled 2020-07-14: qty 1

## 2020-07-14 MED ORDER — SUGAMMADEX SODIUM 200 MG/2ML IV SOLN
INTRAVENOUS | Status: DC | PRN
  Administered 2020-07-14: 200 mg via INTRAVENOUS

## 2020-07-14 MED ORDER — LACTULOSE 10 GM/15ML PO SOLN: 30.0000 g | Freq: Every day | ORAL | Status: DC

## 2020-07-14 MED ORDER — DEXAMETHASONE SODIUM PHOSPHATE 10 MG/ML IJ SOLN
INTRAMUSCULAR | Status: DC | PRN
Start: 2020-07-14 — End: 2020-07-14
  Administered 2020-07-14: 10 mg via INTRAVENOUS

## 2020-07-14 MED ORDER — LACTULOSE 10 GM/15ML PO SOLN
20.0000 g | Freq: Every day | ORAL | Status: DC
  Administered 2020-07-15 – 2020-07-16 (×2): 20 g
  Filled 2020-07-14 (×2): qty 30

## 2020-07-14 MED ORDER — FENTANYL CITRATE (PF) 250 MCG/5ML IJ SOLN
INTRAMUSCULAR | Status: AC
  Filled 2020-07-14: qty 5

## 2020-07-14 MED ORDER — ROCURONIUM BROMIDE 10 MG/ML (PF) SYRINGE
PREFILLED_SYRINGE | INTRAVENOUS | Status: DC | PRN
  Administered 2020-07-14: 40 mg via INTRAVENOUS

## 2020-07-14 MED ORDER — LACTATED RINGERS IV SOLN: INTRAVENOUS | Status: DC | PRN

## 2020-07-14 MED ORDER — ONDANSETRON HCL 4 MG/2ML IJ SOLN
INTRAMUSCULAR | Status: DC | PRN
  Administered 2020-07-14: 4 mg via INTRAVENOUS

## 2020-07-14 MED ORDER — ONDANSETRON HCL 4 MG/2ML IJ SOLN
INTRAMUSCULAR | Status: AC
  Filled 2020-07-14: qty 2

## 2020-07-14 MED ORDER — VITAL AF 1.2 CAL PO LIQD
1000.0000 mL | ORAL | Status: DC
  Administered 2020-07-14 – 2020-07-16 (×3): 1000 mL

## 2020-07-14 MED ORDER — FENTANYL CITRATE (PF) 100 MCG/2ML IJ SOLN
INTRAMUSCULAR | Status: DC | PRN
  Administered 2020-07-14: 50 ug via INTRAVENOUS

## 2020-07-14 MED ORDER — MIDAZOLAM HCL 2 MG/2ML IJ SOLN
INTRAMUSCULAR | Status: AC
  Filled 2020-07-14: qty 2

## 2020-07-14 MED ORDER — PROSOURCE TF PO LIQD
45.0000 mL | Freq: Every day | ORAL | Status: DC
  Administered 2020-07-15 – 2020-07-16 (×2): 45 mL
  Filled 2020-07-14 (×3): qty 45

## 2020-07-14 SURGICAL SUPPLY — 32 items
ADH SKN CLS LQ APL DERMABOND (GAUZE/BANDAGES/DRESSINGS) ×1
APL PRP STRL LF DISP 70% ISPRP (MISCELLANEOUS) ×1
BLADE CLIPPER SURG (BLADE) IMPLANT
CANISTER SUCT 3000ML PPV (MISCELLANEOUS) IMPLANT
CHLORAPREP W/TINT 26 (MISCELLANEOUS) ×3 IMPLANT
COVER SURGICAL LIGHT HANDLE (MISCELLANEOUS) ×3 IMPLANT
COVER WAND RF STERILE (DRAPES) ×3 IMPLANT
DERMABOND ADHESIVE PROPEN (GAUZE/BANDAGES/DRESSINGS) ×2
DERMABOND ADVANCED .7 DNX6 (GAUZE/BANDAGES/DRESSINGS) ×1 IMPLANT
ELECT REM PT RETURN 9FT ADLT (ELECTROSURGICAL) ×3
ELECTRODE REM PT RTRN 9FT ADLT (ELECTROSURGICAL) ×1 IMPLANT
GLOVE BIO SURGEON STRL SZ 6.5 (GLOVE) ×2 IMPLANT
GLOVE BIO SURGEONS STRL SZ 6.5 (GLOVE) ×1
GLOVE BIOGEL PI IND STRL 6 (GLOVE) ×1 IMPLANT
GLOVE BIOGEL PI INDICATOR 6 (GLOVE) ×2
GOWN STRL REUS W/ TWL LRG LVL3 (GOWN DISPOSABLE) ×2 IMPLANT
GOWN STRL REUS W/TWL LRG LVL3 (GOWN DISPOSABLE) ×6
KIT BASIN OR (CUSTOM PROCEDURE TRAY) ×3 IMPLANT
KIT TURNOVER KIT B (KITS) ×3 IMPLANT
NS IRRIG 1000ML POUR BTL (IV SOLUTION) ×3 IMPLANT
PAD ARMBOARD 7.5X6 YLW CONV (MISCELLANEOUS) ×6 IMPLANT
SCISSORS LAP 5X35 DISP (ENDOMECHANICALS) IMPLANT
SET IRRIG TUBING LAPAROSCOPIC (IRRIGATION / IRRIGATOR) IMPLANT
SET TUBE SMOKE EVAC HIGH FLOW (TUBING) ×3 IMPLANT
SLEEVE ENDOPATH XCEL 5M (ENDOMECHANICALS) ×3 IMPLANT
SUT MNCRL AB 4-0 PS2 18 (SUTURE) ×3 IMPLANT
TOWEL GREEN STERILE (TOWEL DISPOSABLE) ×3 IMPLANT
TOWEL GREEN STERILE FF (TOWEL DISPOSABLE) ×3 IMPLANT
TRAY LAPAROSCOPIC MC (CUSTOM PROCEDURE TRAY) ×3 IMPLANT
TROCAR XCEL BLUNT TIP 100MML (ENDOMECHANICALS) IMPLANT
TROCAR XCEL NON-BLD 5MMX100MML (ENDOMECHANICALS) ×3 IMPLANT
TUBE ENDOVIVE SAFETY PEG 22FR (TUBING) IMPLANT

## 2020-07-14 NOTE — Progress Notes (Signed)
Nutrition Follow-up  DOCUMENTATION CODES:   Not applicable  INTERVENTION:  Continue via Cortrak:  -Vital AF 1.2 cal @ 17m/hr -478mProsource TF daily  TF regimen provides 1912 kcals, 128g of protein and 1264 mL of free water. Meets 98% estimated calorie and 100% estimated protein needs  Total free water with current flushes and TF: 1664 mL/day   NUTRITION DIAGNOSIS:   Inadequate oral intake related to acute illness as evidenced by NPO status.  Ongoing  GOAL:   Patient will meet greater than or equal to 90% of their needs  Met with TF  MONITOR:   Vent status, TF tolerance, Weight trends, Labs  REASON FOR ASSESSMENT:   Consult Enteral/tube feeding initiation and management  ASSESSMENT:   [age]o male admitted post witnessed cardiac arrest requiring intubation, NSTEMI, AKI. PMH includes HTN, tobacco abuse, COPD  7/25 Admitted, Intubated 8/2 Cortrak placed (gastric) 8/6 s/p tracheostomy   Per MD, pt remains minimally responsive; however, neurology feels that the pt has a chance of recovery over the next couple of months. Family wishes to trial with trach/PEG and LTAC.  Patient is currently intubated on ventilator support MV: 11.6 L/min Temp (24hrs), Avg:99.9 F (37.7 C), Min:99.6 F (37.6 C), Max:100.3 F (37.9 C)  Current TF: Vital AF 1.2 cal @ 6558mr, 100m17mee water Q6H  Admit wt: 80.1 kg Current wt: 80.1 kg I/O: +6,790ml46mce admit  Labs: CBGs 130-130-116 Medications: colace, novolog, chronulac, protonix, miralax  Diet Order:   Diet Order            Diet NPO time specified  Diet effective midnight                 EDUCATION NEEDS:   Not appropriate for education at this time  Skin:  Skin Assessment: Reviewed RN Assessment  Last BM:  8/8 1200ml 15mrectal tube  Height:   Ht Readings from Last 1 Encounters:  07/04/20 _0  (1.727 m)    Weight:   Wt Readings from Last 1 Encounters:  07/14/20 80.1 kg    BMI:  Body mass  index is 26.85 kg/m.  Estimated Nutritional Needs:   Kcal:  1936 kcals  Protein:  96-128 g  Fluid:  >/= 1.8 L    AmandaLarkin InaRD, LDN RD pager number and weekend/on-call pager number located in [email or web address]

## 2020-07-14 NOTE — Anesthesia Preprocedure Evaluation (Addendum)
Anesthesia Evaluation  Patient identified by MRN, date of birth, ID band Patient unresponsive    Reviewed: Allergy & Precautions, H&P , NPO status , Patient's Chart, lab work & pertinent test results  Airway Mallampati: Trach      Comment: Unable to cooperate with exam Dental   Pulmonary shortness of breath, Current Smoker,    breath sounds clear to auscultation       Cardiovascular hypertension, + Past MI  + dysrhythmias Ventricular Fibrillation  Rhythm:regular Rate:Normal  VF arrest, anoxic brain injury. EF 25-30%   Neuro/Psych  Headaches,    GI/Hepatic   Endo/Other    Renal/GU ARFRenal disease     Musculoskeletal   Abdominal   Peds  Hematology   Anesthesia Other Findings   Reproductive/Obstetrics                            Anesthesia Physical Anesthesia Plan  ASA: IV  Anesthesia Plan: General   Post-op Pain Management:    Induction: Inhalational  PONV Risk Score and Plan: Midazolam, Dexamethasone, Treatment may vary due to age or medical condition and Ondansetron  Airway Management Planned: Oral ETT  Additional Equipment:   Intra-op Plan:   Post-operative Plan: Possible Post-op intubation/ventilation  Informed Consent: I have reviewed the patients History and Physical, chart, labs and discussed the procedure including the risks, benefits and alternatives for the proposed anesthesia with the patient or authorized representative who has indicated his/her understanding and acceptance.       Plan Discussed with: CRNA, Anesthesiologist and Surgeon  Anesthesia Plan Comments:         Anesthesia Quick Evaluation

## 2020-07-14 NOTE — Progress Notes (Signed)
Progress Note  Patient Name: Christopher [name] Date of Encounter: 07/14/2020  [facility] Cardiologist: No primary care provider on file. New- Virl Axe MD  Subjective   Unresponsive on vent.   Inpatient Medications    Scheduled Meds: . acetaminophen (TYLENOL) oral liquid 160 mg/5 mL  650 mg Per Tube Q4H   Or  . acetaminophen  650 mg Rectal Q4H  . aspirin  81 mg Per Tube Daily  . atorvastatin  20 mg Per Tube Daily  . carvedilol  25 mg Per Tube BID WC  . chlorhexidine gluconate (MEDLINE KIT)  15 mL Mouth Rinse BID  . Chlorhexidine Gluconate Cloth  6 each Topical Daily  . docusate  100 mg Per Tube BID  . donepezil  5 mg Per Tube QHS  . free water  100 mL Per Tube Q6H  . heparin injection (subcutaneous)  5,000 Units Subcutaneous Q8H  . insulin aspart  0-15 Units Subcutaneous Q4H  . ipratropium-albuterol  3 mL Nebulization TID  . isosorbide dinitrate  30 mg Per Tube TID  . lactulose  30 g Oral BID  . losartan  25 mg Per Tube BID  . mouth rinse  15 mL Mouth Rinse 10 times per day  . pantoprazole sodium  40 mg Per Tube Q1200  . polyethylene glycol  17 g Per Tube Daily  . scopolamine  1 patch Transdermal Q72H   Continuous Infusions: . sodium chloride Stopped (07/11/20 0207)  .  ceFAZolin (ANCEF) IV 2 g (07/14/20 1749)  . feeding supplement (VITAL AF 1.2 CAL) 1,000 mL (07/13/20 1616)   PRN Meds: acetaminophen, fentaNYL (SUBLIMAZE) injection, labetalol, pneumococcal 23 valent vaccine, polyethylene glycol   Vital Signs    Vitals:   07/14/20 0442 07/14/20 0500 07/14/20 0600 07/14/20 0700  BP:  (!) 126/59 (!) 116/48 126/62  Pulse:  71 66 79  Resp:  (!) 23 (!) 21 19  Temp:      TempSrc:      SpO2:  97% 100% 98%  Weight: 80.1 kg     Height:        Intake/Output Summary (Last 24 hours) at 07/14/2020 0823 Last data filed at 07/14/2020 0400 Gross per 24 hour  Intake 1182.33 ml  Output 1875 ml  Net -692.67 ml   Last 3 Weights 07/14/2020 07/13/2020 07/12/2020  Weight (lbs)  176 lb 9.4 oz 176 lb 9.4 oz 181 lb 14.1 oz  Weight (kg) 80.1 kg 80.1 kg 82.5 kg      Telemetry    NSR with PACs. Short burst of PACs up to 10 beats. No Afib.- Personally Reviewed  ECG    NSR with LVH and repolarization abnormality. QTc is normal.- Personally Reviewed  Physical Exam   GEN: No acute distress.   Neck: No JVD. Trach tube in place Cardiac: RRR, no murmurs, rubs, or gallops.  Respiratory: Clear to auscultation bilaterally. GI: Soft, nontender, non-distended  MS: No edema; No deformity. Neuro:  blinks eyes. Moves extremities. No purposeful response.   Labs    High Sensitivity Troponin:   Recent Labs  Lab 06/29/20 0009 06/29/20 0209  TROPONINIHS 344* 1,322*      Chemistry Recent Labs  Lab 07/12/20 0033 07/13/20 0207 07/14/20 0111  NA 140 141 142  K 4.2 3.8 3.8  CL 106 106 109  CO2 23 21* 22  GLUCOSE 165* 169* 161*  BUN 51* 64* 75*  CREATININE 1.55* 1.78* 2.14*  CALCIUM 9.1 9.0 9.1  PROT 6.2*  --   --  ALBUMIN 2.1*  --   --   AST 69*  --   --   ALT 18  --   --   ALKPHOS 118  --   --   BILITOT 0.5  --   --   GFRNONAA 44* 37* 29*  GFRAA 51* 43* 34*  ANIONGAP _0 Hematology Recent Labs  Lab 07/12/20 0033 07/13/20 0207 07/14/20 0111  WBC 18.8* 22.4* 21.8*  RBC 3.16* 2.93* 2.74*  HGB 9.3* 8.8* 8.2*  HCT 29.7* 27.5* 26.1*  MCV 94.0 93.9 95.3  MCH 29.4 30.0 29.9  MCHC 31.3 32.0 31.4  RDW 14.1 14.3 14.6  PLT 401* 441* 419*    BNPNo results for input(s): BNP, PROBNP in the last 168 hours.   DDimer No results for input(s): DDIMER in the last 168 hours.   Radiology    DG Chest 1 View  Result Date: 07/13/2020 CLINICAL DATA:  ARDS post CPR EXAM: CHEST  1 VIEW COMPARISON:  07/11/2020 chest radiograph. FINDINGS: Tracheostomy tube tip overlies the tracheal air column just below the thoracic inlet. Enteric tube enters stomach with the tip not seen on this image. Stable cardiomediastinal silhouette with normal heart size. No  pneumothorax. No pleural effusion. Symmetric mild prominence of the hila bilaterally, unchanged. No pulmonary edema. Mild bibasilar atelectasis, similar. IMPRESSION: 1. Well-positioned support structures. 2. Stable mild bibasilar atelectasis. 3. Stable symmetric prominence of the hila, cannot exclude adenopathy. Chest CT with IV contrast may be obtained for further evaluation as clinically warranted. Electronically Signed   By: Ilona Sorrel M.D.   On: 07/13/2020 12:38    Cardiac Studies   Echo 06/30/20: IMPRESSIONS    1. There are cystic structures noted in the liver. Dedicated RUQ  ultrasound is recommended for better evaluation.  2. Left ventricular ejection fraction, by estimation, is 25 to 30%. The  left ventricle has severely decreased function. The left ventricle  demonstrates global hypokinesis. Left ventricular diastolic parameters are  consistent with Grade I diastolic  dysfunction (impaired relaxation).  3. Right ventricular systolic function is mildly reduced. The right  ventricular size is mildly enlarged. Tricuspid regurgitation signal is  inadequate for assessing PA pressure.  4. Left atrial size was moderately dilated.  5. The mitral valve is grossly normal. Trivial mitral valve  regurgitation.  6. The aortic valve is tricuspid. Aortic valve regurgitation is not  visualized. No aortic stenosis is present.   Patient Profile     74 y.o. male admitted with a VF arrest and now with severe anoxic encephalopathy remains with VDRF  Assessment & Plan    1.  VF arrest- now day 15 post arrest S/p defibrillation in the field with at least 15 minute downtime Anoxic brain injury persists Prognosis is guarded Consider cath if he makes meaningful recovery- but unlikely this admission with plans to go to LTAC. Ep could see post cath but unlikely candidate for ICD unless there is significant neurologic recovery Plans for PEG tube placement.  2. Cardiomyopathy EF  25-30% Possibly due to cardiac arrest versus Etoh abuse. Doubt ischemic.  Reassess EF prior to discharge Cath if he wakes up Continue coreg, nitrates, and losartan Will hold off on hydralazine for now due to soft BP  3. Acute on chronic renal failure No changes- plans per CCM  4. afib Likely due to acute medical illness I am not convinced that he warrants long term anticoagulation unless if afib recurs  No recurrence off amiodarone  6.  Anoxic brain injury Prognosis is guarded      For questions or updates, please contact [place] Please consult [email or web address] for contact info under        Signed, Ryleigh Esqueda [place], MD  07/14/2020, 8:23 AM

## 2020-07-14 NOTE — Transfer of Care (Signed)
Immediate Anesthesia Transfer of Care Note  Patient: Christopher [name]  Procedure(s) Performed: PERCUTANEOUS ENDOSCOPIC GASTROSTOMY (PEG) PLACEMENT (N/A Abdomen)  Patient Location: ICU  Anesthesia Type:General  Level of Consciousness: responds to stimulation  Airway & Oxygen Therapy: Patient placed on Ventilator (see vital sign flow sheet for setting)  Post-op Assessment: Report given to RN and Post -op Vital signs reviewed and stable  Post vital signs: Reviewed and stable  Last Vitals:  Vitals Value Taken Time  BP    Temp    Pulse    Resp    SpO2      Last Pain:  Vitals:   07/14/20 1114  TempSrc: Oral  PainSc:          Complications: No complications documented.

## 2020-07-14 NOTE — Progress Notes (Signed)
Patient placed back on full support after returning from the OR. RT to monitor.

## 2020-07-14 NOTE — Progress Notes (Signed)
SLP Cancellation Note  Patient Details Name: Christopher [name] MRN: [id number] DOB: 1946-05-04   Cancelled treatment:        On vent. Will follow.   [place] Siren 07/14/2020, 8:00 AM

## 2020-07-14 NOTE — Progress Notes (Signed)
NAME:  Christopher [name], MRN:  [id number], DOB:  March 14, 1946, LOS: 57 ADMISSION DATE:  06/28/2020, CONSULTATION DATE:  07/14/20 REFERRING MD:  EDP, CHIEF COMPLAINT:  Cardiac arrest   Brief History   74 y.o. M with PMH of memory loss, HTN who had a witnessed arrest while sitting on the couch, 5 minutes down time without CPR.  EMS found patient in Vfib and pt was defibrillated 6 times and received 4 rounds EPI.  Intubated in the ED and PCCM consulted for admission  Pt was Intubated in the ED, CT head was negative for acute findings, he was initially hypotensive and started on Levophed.  Blood pressure improved and pressors were stopped and he became hypotensive.   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   Significant Hospital Events   7/25 Admit to PCCM 7/26 Being actively cooled on Arctic sun pads with goal normothermia 8/01 On vent, no change in mental status 8/6 trach  Consults:  Cardiology Neurology  CCS Procedures:  7/25 ETT >> 8/6 tracheostomy  Significant Diagnostic Tests:  CT head 7/5 >> no acute findings, white matter disease, greatest in the left frontal lobe Echo 7/26 >> EF 25 to 30%, global hypokinesis, reduced RV SF MRI brain 7/29 >> no strong evidence of anoxic brain injury at this time, no acute intracranial abnormality, advanced chronic small vessel disease  Micro Data:  7/25 SARS-CoV-2 >> negative 7/30 Resp >> moderate klebsiella pneumoniae >> S-cefazolin  8/8 tracheal aspirate> no WBC, no orgs  8/9 CCS consulted for PEG vs Gtube evaluation   Antimicrobials:  Cefepime 7/30 >> 8/1  Cefazolin 8/1 >>   Interim history/subjective:  NAEO   Evaluated by GenSurg this morning for candidacy for PEG vs open Gtube  Objective   Blood pressure (!) 126/54, pulse 83, temperature 99.6 F (37.6 C),  temperature source Axillary, resp. rate 19, height 5\' 8"  (1.727 m), weight 80.1 kg, SpO2 96 %.    Vent Mode: PRVC FiO2 (%):  [40 %-60 %] 40 % Set Rate:  [16 bmp] 16 bmp Vt Set:  [550 mL] 550 mL PEEP:  [5 cmH20] 5 cmH20 Plateau Pressure:  [16 cmH20-20 cmH20] 16 cmH20   Intake/Output Summary (Last 24 hours) at 07/14/2020 0845 Last data filed at 07/14/2020 0400 Gross per 24 hour  Intake 1182.33 ml  Output 1875 ml  Net -692.67 ml   Filed Weights   07/12/20 0418 07/13/20 0500 07/14/20 0442  Weight: 82.5 kg 80.1 kg 80.1 kg   GEN: Elderly, ill appearing M, trach/vent, NAD  HEENT: Cortrak secure. Tongue with areas of necrosis, not protruding. Trach secure, trachea midline  CV: Intermittent irregularity with PACs. 2+ radial pulse. s1s2 no rgm  PULM: Diminished bibasilar sounds. Symmetrical chest expansion. No accessory muscle use on PSV/CPAP. No adventitious sounds  GI: Round, soft. + bowel sounds. Rectal tube in place EXT: No obvious joint deformity. No cyanosis or clubbing. No edema NEURO:  Slight grimace to pain, weakly opens eyes to stimulation. 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  LTM EEG neg for seizures (7/26), MRI without evidence of anoxic injury (7/29).  Completed TTM  protocol.  Last sedation 7/31. Neurology thinks there is reasonable chance of recovery over next couple months.  Family would like this trial with trach/PEG and LTACH. -possible that there is also degree of metabolic encephalopathy with worsening AKI. Mild hyperammonemia is improving   P - Limit sedatives -decreasing BID lactulose to qD, continue to trend ammonia   Acute Hypoxic Respiratory Failure in setting of Cardiac Arrest COPD , Tobacco abuse P - s/p trach,  routine trach care  - trach sutures out 8/11 -continue efforts at working toward ATC. Currently tolerating PSV trial  -duonebs   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   Dysphagia- - General surgery consult for PEG vs open  -NPO for possibly procedure   Self inflicted tongue trauma, secretions- continue bite block for now, hopefully should improve after trach -ENT feels nothing to be done from surgical standpoint P - Continue scop patch  - Cefazolin for a few more days for infection ppx  Prediabetes Hgb A1c 6.1 -follow glucose trend  -SSI, moderate scale   Leukocytosis -PCT 0.27, elevated temp -tracheal aspirate without WBC, without organisms, cxr without obvious sign of pna   P -trend WBC, fever curve, micro  -PRN APAP  Best practice:  Diet: NPO Pain/Anxiety/Delirium protocol (if indicated): limit as able VAP protocol (if indicated): HOB   DVT prophylaxis: Heparin subQ GI prophylaxis: protonix Glucose control: SSI Mobility: bed rest Code Status: full code Family Communication: pending 8/9 Disposition: ICU, can start working toward [facility] placement  CRITICAL CARE Performed by: Cristal Generous   Total critical care time: 40 minutes  Critical care time was exclusive of separately billable procedures and treating  other patients. Critical care was necessary to treat or prevent imminent or life-threatening deterioration.  Critical care was time spent personally by me on the following activities: development of treatment plan with patient and/or surrogate as well as nursing, discussions with consultants, evaluation of patient's response to treatment, examination of patient, obtaining history from patient or surrogate, ordering and performing treatments and interventions, ordering and review of laboratory studies, ordering and review of radiographic studies, pulse oximetry and re-evaluation of patient's condition.  Eliseo Gum MSN, AGACNP-BC [place] [phone] If no answer, [phone] 07/14/2020, 9:32 AM

## 2020-07-14 NOTE — TOC CAGE-AID Note (Signed)
Transition of Care [facility]) - CAGE-AID Screening   Patient Details  Name: Christopher [name] MRN: [id number] Date of Birth: [birth year]  Transition of Care [facility]) CM/SW Contact:    Emeterio Reeve, [place] Phone Number: 07/14/2020, 12:32 PM   Clinical Narrative:  Pt was unable to participate in assessment due to being on vent.   CAGE-AID Screening: Substance Abuse Screening unable to be completed due to: : Patient unable to participate               Providence Crosby Clinical Social Worker [phone]

## 2020-07-14 NOTE — Op Note (Signed)
   Procedure Note  Date: 07/14/2020  Procedure: esophagogastroduodenoscopy (EGD) and percutaneous endoscopic gastrostomy (PEG) tube placement  Pre-op diagnosis: dysphagia Post-op diagnosis: same  Indication and clinical history: 49M with expected prolonged mechanical ventilation and dysphagia  Surgeon: Jesusita Oka, MD Assistant: Wynetta Emery, [place]  Anesthesia: general  Findings: cobblestone appearance of stomach, esophagus and duodenum normal in appearance; decubitus ulcer of tip of tongue . Specimen: none . EBL: <5cc . Drains/Implants: PEG tube, 3cm at the skin   Disposition: ICU/PACU  Description of Procedure: The patient was positioned supine. Time-out was performed verifying correct patient, procedure, signature of informed consent, and pre-operative antibiotics as indicated. MAC induction was uneventful and a bite block was placed into the oropharynx. The endoscope was inserted into the oropharynx and advanced down the esophagus into the stomach and into the duodenum. The visualized esophagus and duodenum were unremarkable. The endoscope was retracted back into the stomach and the stomach was insufflated. The stomach was inspected and was also normal. Transillumination was performed. The light was visible on the external skin and dimpling of the stomach was noted endoscopically with manual pressure. The abdomen was prepped and draped in the usual sterile fashion. Transillumination and dimpling were repeated and local anesthetic was infiltrated to make a skin wheal at the site of transillumination. The needle was inserted perpendicularly to the skin and the tip of the needle was visualized endoscopically. As the needle was retracted, the tract was also anesthetized. A skin nick was made at the site of the wheal and an introducer needle and sheath were inserted. The needle was removed and guidewire inserted. The guidewire was grasped by an endoscopic snare and the snare, guidewire, and  endoscope retracted out of the oropharynx. The PEG tube was secured to the guidewire and retracted through the mouth and esophagus into the stomach. The PEG tube was secured with a bolster and was visualized endoscopically to spin freely circumferentially and also be without gaps between the internal bumper and the stomach wall. There was no evidence of bleeding. The PEG bolster was secured at 3cm at the skin and there were no gaps between the bolster and the abdominal wall. The stomach was desufflated endoscopically and the endoscope removed. The bite block was also removed. The patient tolerated the procedure well and there were no complications.   The patient may have water and medications administered via the PEG tube beginning immediately and tube feeds may be initiated four hours post-procedure.    Jesusita Oka, MD General and [facility]

## 2020-07-14 NOTE — Anesthesia Postprocedure Evaluation (Signed)
Anesthesia Post Note  Patient: Christopher [name]  Procedure(s) Performed: PERCUTANEOUS ENDOSCOPIC GASTROSTOMY (PEG) PLACEMENT (N/A Abdomen)     Patient location during evaluation: SICU Anesthesia Type: General Level of consciousness: sedated Pain management: pain level controlled Vital Signs Assessment: post-procedure vital signs reviewed and stable Respiratory status: patient remains intubated per anesthesia plan Cardiovascular status: stable Postop Assessment: no apparent nausea or vomiting Anesthetic complications: no   No complications documented.  Last Vitals:  Vitals:   07/14/20 1800 07/14/20 1900  BP: (!) 107/46 (!) 87/42  Pulse: (!) 31 (!) 57  Resp: 16 17  Temp:    SpO2: 100% 100%    Last Pain:  Vitals:   07/14/20 1538  TempSrc: Oral  PainSc:                  Catalina Gravel

## 2020-07-14 NOTE — Consult Note (Addendum)
[facility] Surgery Trauma Consult Note  Christopher [name] November 16, 1946  [id number].    Requesting MD: Ina Homes, MD  Chief Complaint/Reason for Consult: G-tube placement HPI:  Christopher [name] is a 74 y.o. male with PMH of memory loss and HTN who had a witnessed arrest while sitting on the couch, 5 minutes down time without CPR.  EMS was called and found patient in [place]. He was defibrillated 6 times and received 4 rounds EPI.  Intubated in the ED and admitted to [facility] for further workup and management. CT head showed no acute findings and MRI brain showed no strong evidence of anoxic brain injury. Patient had a trach placed. He remains minimally responsive however neurology feels he has a chance of recovery over the next  Couple of months and family wishes to trial this with Trach/PEG and LTAC. Surgery has been consulted for PEG vs G-tube placement. No history of abdominal surgery based on chart review.  Per RN patients TF were held at [facility], hep gtt was stopped yesterday due to bleeding from trach site.   TMAX 100.7 last 24h, AFVSS this AM  WBC 21.8  hgb 8.2 (from 8.8)   ROS: Review of Systems  Unable to perform ROS: Patient unresponsive   Family History  Problem Relation Age of Onset  . Heart disease Mother        Died at age 50  . Parkinson's disease Mother   . Parkinson's disease Brother     Past Medical History:  Diagnosis Date  . Colon polyp   . Hypertension   . 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 Migraine   . Mononucleosis 1990s  . Rash   . Shortness of breath ~1998   Pneumonia   . Skin moles    abnormal  . Wrist fracture    Childhood    Past Surgical History:  Procedure Laterality Date  . TRIGGER FINGER RELEASE Right     Social History:  reports that he has been smoking. He has been smoking about 0.75 packs per day. He has never used smokeless tobacco. He reports current alcohol use. He reports that he does not use drugs.  Allergies:  Allergies  Allergen  Reactions  . Penicillins Rash    Medications Prior to Admission  Medication Sig Dispense Refill  . Ascorbic Acid (VITAMIN C PO) Take 1 tablet by mouth daily.    Marland Kitchen aspirin EC 81 MG tablet Take 81 mg by mouth daily. Swallow whole.    Marland Kitchen atorvastatin (LIPITOR) 20 MG tablet Take 20 mg by mouth daily.    . budesonide-formoterol (SYMBICORT) 80-4.5 MCG/ACT inhaler Inhale 2 puffs into the lungs 2 (two) times daily as needed (shortness of breath/wheezing).    Marland Kitchen donepezil (ARICEPT) 5 MG tablet Take 5 mg by mouth at bedtime.    Marland Kitchen GLUCOSAMINE-CHONDROITIN PO Take 1 tablet by mouth daily.    . Multiple Vitamins-Minerals ([employer] SPECTRAVITE PO) Take 1 tablet by mouth daily.     Marland Kitchen omeprazole (PRILOSEC) 20 MG capsule Take 20 mg by mouth 2 (two) times daily before a meal.    . Potassium Chloride ER 20 MEQ TBCR Take 40 mEq by mouth daily.     . Saw Palmetto, Serenoa repens, (SAW PALMETTO PO) Take 1 capsule by mouth daily.    . valACYclovir (VALTREX) 500 MG tablet Take 500 mg by mouth 2 (two) times daily.      Blood pressure 126/62, pulse 79, temperature 99.6 F (37.6 C), temperature source Axillary, resp. rate  19, height 5\' 8"  (1.727 m), weight 80.1 kg, SpO2 98 %. Physical Exam: Constitutional: NAD; minimally responsive, black male on vent  Eyes: Moist conjunctiva; no lid lag; anicteric; PERRL Neck: Trachea midline; tracheostomy in place, no active bleeding  Lungs: ventilated respirations (FiO2 40%, 5 PEEP); no tactile fremitus  CV: RRR; no palpable thrills; no pitting edema  GI: Abd soft; mildly distended, non-tender, no palpable hepatosplenomegaly; rectal tube in place w/ non-bloody stool MSK: symmetrical  no clubbing/cyanosis Psychiatric: unable to assess Neuro: responds to noxious stimuli, no following commands  Lymphatic: No palpable cervical or axillary lymphadenopathy  Results for orders placed or performed during the hospital encounter of 06/28/20 (from the past 48 hour(s))  Glucose, capillary      Status: Abnormal   Collection Time: 07/12/20  8:10 AM  Result Value Ref Range   Glucose-Capillary 176 (H) 70 - 99 mg/dL    Comment: Glucose reference range applies only to samples taken after fasting for at least 8 hours.  Glucose, capillary     Status: Abnormal   Collection Time: 07/12/20 12:06 PM  Result Value Ref Range   Glucose-Capillary 160 (H) 70 - 99 mg/dL    Comment: Glucose reference range applies only to samples taken after fasting for at least 8 hours.  Glucose, capillary     Status: Abnormal   Collection Time: 07/12/20  4:03 PM  Result Value Ref Range   Glucose-Capillary 156 (H) 70 - 99 mg/dL    Comment: Glucose reference range applies only to samples taken after fasting for at least 8 hours.  Glucose, capillary     Status: Abnormal   Collection Time: 07/12/20  7:43 PM  Result Value Ref Range   Glucose-Capillary 176 (H) 70 - 99 mg/dL    Comment: Glucose reference range applies only to samples taken after fasting for at least 8 hours.  Glucose, capillary     Status: Abnormal   Collection Time: 07/12/20 11:50 PM  Result Value Ref Range   Glucose-Capillary 167 (H) 70 - 99 mg/dL    Comment: Glucose reference range applies only to samples taken after fasting for at least 8 hours.  CBC     Status: Abnormal   Collection Time: 07/13/20  2:07 AM  Result Value Ref Range   WBC 22.4 (H) 4.0 - 10.5 K/uL   RBC 2.93 (L) 4.22 - 5.81 MIL/uL   Hemoglobin 8.8 (L) 13.0 - 17.0 g/dL   HCT 27.5 (L) 39 - 52 %   MCV 93.9 80.0 - 100.0 fL   MCH 30.0 26.0 - 34.0 pg   MCHC 32.0 30.0 - 36.0 g/dL   RDW 14.3 11.5 - 15.5 %   Platelets 441 (H) 150 - 400 K/uL   nRBC 0.0 0.0 - 0.2 %    Comment: Performed at [facility] [address] [place], [place] [address]  Basic metabolic panel     Status: Abnormal   Collection Time: 07/13/20  2:07 AM  Result Value Ref Range   Sodium 141 135 - 145 mmol/L   Potassium 3.8 3.5 - 5.1 mmol/L   Chloride 106 98 - 111 mmol/L   CO2 21 (L) 22 - 32 mmol/L    Glucose, Bld 169 (H) 70 - 99 mg/dL    Comment: Glucose reference range applies only to samples taken after fasting for at least 8 hours.   BUN 64 (H) 8 - 23 mg/dL   Creatinine, Ser 1.78 (H) 0.61 - 1.24 mg/dL   Calcium  9.0 8.9 - 10.3 mg/dL   GFR calc non Af Amer 37 (L) >60 mL/min   GFR calc Af Amer 43 (L) >60 mL/min   Anion gap 14 5 - 15    Comment: Performed at [facility] [address], [place], [place] [address]  Heparin level (unfractionated)     Status: None   Collection Time: 07/13/20  2:07 AM  Result Value Ref Range   Heparin Unfractionated 0.34 0.30 - 0.70 IU/mL    Comment: (NOTE) If heparin results are below expected values, and patient dosage has  been confirmed, suggest follow up testing of antithrombin III levels. Performed at [facility], [place] [address] [place], [place] [address]   Procalcitonin - Baseline     Status: None   Collection Time: 07/13/20  2:07 AM  Result Value Ref Range   Procalcitonin 0.27 ng/mL    Comment:        Interpretation: PCT (Procalcitonin) <= 0.5 ng/mL: Systemic infection (sepsis) is not likely. Local bacterial infection is possible. (NOTE)       Sepsis PCT Algorithm           Lower Respiratory Tract                                      Infection PCT Algorithm    ----------------------------     ----------------------------         PCT < 0.25 ng/mL                PCT < 0.10 ng/mL          Strongly encourage             Strongly discourage   discontinuation of antibiotics    initiation of antibiotics    ----------------------------     -----------------------------       PCT 0.25 - 0.50 ng/mL            PCT 0.10 - 0.25 ng/mL               OR       >80% decrease in PCT            Discourage initiation of                                            antibiotics      Encourage discontinuation           of antibiotics    ----------------------------     -----------------------------         PCT >= 0.50 ng/mL              PCT  0.26 - 0.50 ng/mL               AND        <80% decrease in PCT             Encourage initiation of                                             antibiotics       Encourage continuation  of antibiotics    ----------------------------     -----------------------------        PCT >= 0.50 ng/mL                  PCT > 0.50 ng/mL               AND         increase in PCT                  Strongly encourage                                      initiation of antibiotics    Strongly encourage escalation           of antibiotics                                     -----------------------------                                           PCT <= 0.25 ng/mL                                                 OR                                        > 80% decrease in PCT                                      Discontinue / Do not initiate                                             antibiotics  Performed at [facility], [address] [place], [place] [address]   Glucose, capillary     Status: Abnormal   Collection Time: 07/13/20  3:57 AM  Result Value Ref Range   Glucose-Capillary 156 (H) 70 - 99 mg/dL    Comment: Glucose reference range applies only to samples taken after fasting for at least 8 hours.  Glucose, capillary     Status: Abnormal   Collection Time: 07/13/20  7:42 AM  Result Value Ref Range   Glucose-Capillary 154 (H) 70 - 99 mg/dL    Comment: Glucose reference range applies only to samples taken after fasting for at least 8 hours.  Culture, respiratory (non-expectorated)     Status: None (Preliminary result)   Collection Time: 07/13/20 10:33 AM   Specimen: Tracheal Aspirate; Respiratory  Result Value Ref Range   Specimen Description TRACHEAL ASPIRATE    Special Requests NONE    Gram Stain      NO WBC SEEN NO ORGANISMS SEEN Performed at [facility], [address], [place], [place] [address]    Culture PENDING  Report Status PENDING   Glucose,  capillary     Status: Abnormal   Collection Time: 07/13/20 11:49 AM  Result Value Ref Range   Glucose-Capillary 179 (H) 70 - 99 mg/dL    Comment: Glucose reference range applies only to samples taken after fasting for at least 8 hours.  Ammonia     Status: Abnormal   Collection Time: 07/13/20 12:01 PM  Result Value Ref Range   Ammonia 44 (H) 9 - 35 umol/L    Comment: Performed at [facility], [place] [address] [place], [place] [address]  Glucose, capillary     Status: Abnormal   Collection Time: 07/13/20  3:55 PM  Result Value Ref Range   Glucose-Capillary 147 (H) 70 - 99 mg/dL    Comment: Glucose reference range applies only to samples taken after fasting for at least 8 hours.  Glucose, capillary     Status: Abnormal   Collection Time: 07/13/20  7:59 PM  Result Value Ref Range   Glucose-Capillary 142 (H) 70 - 99 mg/dL    Comment: Glucose reference range applies only to samples taken after fasting for at least 8 hours.  Glucose, capillary     Status: Abnormal   Collection Time: 07/14/20 12:17 AM  Result Value Ref Range   Glucose-Capillary 130 (H) 70 - 99 mg/dL    Comment: Glucose reference range applies only to samples taken after fasting for at least 8 hours.  CBC     Status: Abnormal   Collection Time: 07/14/20  1:11 AM  Result Value Ref Range   WBC 21.8 (H) 4.0 - 10.5 K/uL   RBC 2.74 (L) 4.22 - 5.81 MIL/uL   Hemoglobin 8.2 (L) 13.0 - 17.0 g/dL   HCT 26.1 (L) 39 - 52 %   MCV 95.3 80.0 - 100.0 fL   MCH 29.9 26.0 - 34.0 pg   MCHC 31.4 30.0 - 36.0 g/dL   RDW 14.6 11.5 - 15.5 %   Platelets 419 (H) 150 - 400 K/uL   nRBC 0.0 0.0 - 0.2 %    Comment: Performed at [facility] [address] [place], [place] [address]  Basic metabolic panel     Status: Abnormal   Collection Time: 07/14/20  1:11 AM  Result Value Ref Range   Sodium 142 135 - 145 mmol/L   Potassium 3.8 3.5 - 5.1 mmol/L   Chloride 109 98 - 111 mmol/L   CO2 22 22 - 32 mmol/L   Glucose, Bld 161 (H) 70 -  99 mg/dL    Comment: Glucose reference range applies only to samples taken after fasting for at least 8 hours.   BUN 75 (H) 8 - 23 mg/dL   Creatinine, Ser 2.14 (H) 0.61 - 1.24 mg/dL   Calcium 9.1 8.9 - 10.3 mg/dL   GFR calc non Af Amer 29 (L) >60 mL/min   GFR calc Af Amer 34 (L) >60 mL/min   Anion gap 11 5 - 15    Comment: Performed at [facility] [address], [place], [place] [address]  Magnesium     Status: None   Collection Time: 07/14/20  1:11 AM  Result Value Ref Range   Magnesium 2.4 1.7 - 2.4 mg/dL    Comment: Performed at [facility] [address], [place], [place] [address]  Phosphorus     Status: None   Collection Time: 07/14/20  1:11 AM  Result Value Ref Range   Phosphorus  4.5 2.5 - 4.6 mg/dL    Comment: Performed at [facility], [place] [address] [place], [place] [address]  Glucose, capillary     Status: Abnormal   Collection Time: 07/14/20  4:31 AM  Result Value Ref Range   Glucose-Capillary 130 (H) 70 - 99 mg/dL    Comment: Glucose reference range applies only to samples taken after fasting for at least 8 hours.   DG Chest 1 View  Result Date: 07/13/2020 CLINICAL DATA:  ARDS post CPR EXAM: CHEST  1 VIEW COMPARISON:  07/11/2020 chest radiograph. FINDINGS: Tracheostomy tube tip overlies the tracheal air column just below the thoracic inlet. Enteric tube enters stomach with the tip not seen on this image. Stable cardiomediastinal silhouette with normal heart size. No pneumothorax. No pleural effusion. Symmetric mild prominence of the hila bilaterally, unchanged. No pulmonary edema. Mild bibasilar atelectasis, similar. IMPRESSION: 1. Well-positioned support structures. 2. Stable mild bibasilar atelectasis. 3. Stable symmetric prominence of the hila, cannot exclude adenopathy. Chest CT with IV contrast may be obtained for further evaluation as clinically warranted. Electronically Signed   By: Ilona Sorrel M.D.   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 Bobbye Morton.  - patient is currently NPO, once I confirm procedure with MD I will proceed to get consent from family members over the phone.    Jill Alexanders, PA-C [facility] Please see Amion for pager number during day hours 7:00am-4:30pm 07/14/2020, 7:37 AM

## 2020-07-15 ENCOUNTER — Inpatient Hospital Stay (HOSPITAL_COMMUNITY): Payer: Medicare Other

## 2020-07-15 DIAGNOSIS — Z930 Tracheostomy status: Secondary | ICD-10-CM

## 2020-07-15 DIAGNOSIS — I255 Ischemic cardiomyopathy: Secondary | ICD-10-CM

## 2020-07-15 LAB — ECHOCARDIOGRAM LIMITED
Area-P 1/2: 2.8 cm2
Height: 68 in
S' Lateral: 3.5 cm
Weight: 2864.22 oz

## 2020-07-15 LAB — BASIC METABOLIC PANEL
Anion gap: 16 — ABNORMAL HIGH (ref 5–15)
BUN: 92 mg/dL — ABNORMAL HIGH (ref 8–23)
CO2: 18 mmol/L — ABNORMAL LOW (ref 22–32)
Calcium: 9.3 mg/dL (ref 8.9–10.3)
Chloride: 108 mmol/L (ref 98–111)
Creatinine, Ser: 2.19 mg/dL — ABNORMAL HIGH (ref 0.61–1.24)
GFR calc Af Amer: 33 mL/min — ABNORMAL LOW (ref >60)
GFR calc non Af Amer: 29 mL/min — ABNORMAL LOW (ref >60)
Glucose, Bld: 207 mg/dL — ABNORMAL HIGH (ref 70–99)
Potassium: 4.4 mmol/L (ref 3.5–5.1)
Sodium: 142 mmol/L (ref 135–145)

## 2020-07-15 LAB — CBC
HCT: 25.3 % — ABNORMAL LOW (ref 39.0–52.0)
Hemoglobin: 8 g/dL — ABNORMAL LOW (ref 13.0–17.0)
MCH: 30 pg (ref 26.0–34.0)
MCHC: 31.6 g/dL (ref 30.0–36.0)
MCV: 94.8 fL (ref 80.0–100.0)
Platelets: 478 10*3/uL — ABNORMAL HIGH (ref 150–400)
RBC: 2.67 MIL/uL — ABNORMAL LOW (ref 4.22–5.81)
RDW: 14.5 % (ref 11.5–15.5)
WBC: 21.8 10*3/uL — ABNORMAL HIGH (ref 4.0–10.5)
nRBC: 0 % (ref 0.0–0.2)

## 2020-07-15 LAB — GLUCOSE, CAPILLARY
Glucose-Capillary: 155 mg/dL — ABNORMAL HIGH (ref 70–99)
Glucose-Capillary: 148 mg/dL — ABNORMAL HIGH (ref 70–99)
Glucose-Capillary: 154 mg/dL — ABNORMAL HIGH (ref 70–99)
Glucose-Capillary: 141 mg/dL — ABNORMAL HIGH (ref 70–99)
Glucose-Capillary: 125 mg/dL — ABNORMAL HIGH (ref 70–99)

## 2020-07-15 LAB — PROCALCITONIN: Procalcitonin: 0.44 ng/mL

## 2020-07-15 LAB — CULTURE, RESPIRATORY W GRAM STAIN: Gram Stain: NONE SEEN

## 2020-07-15 LAB — PHOSPHORUS: Phosphorus: 5.5 mg/dL — ABNORMAL HIGH (ref 2.5–4.6)

## 2020-07-15 LAB — MAGNESIUM: Magnesium: 2.5 mg/dL — ABNORMAL HIGH (ref 1.7–2.4)

## 2020-07-15 MED ORDER — CEFAZOLIN SODIUM-DEXTROSE 2-4 GM/100ML-% IV SOLN
2.0000 g | Freq: Two times a day (BID) | INTRAVENOUS | Status: DC
  Administered 2020-07-16 (×2): 2 g via INTRAVENOUS
  Filled 2020-07-15 (×5): qty 100

## 2020-07-15 NOTE — Progress Notes (Signed)
NAME:  Christopher [name], MRN:  [id number], DOB:  1946/05/05, LOS: 95 ADMISSION DATE:  06/28/2020, CONSULTATION DATE:  07/15/20 REFERRING MD:  EDP, CHIEF COMPLAINT:  Cardiac arrest   Brief History   74 y.o. M with PMH of memory loss, HTN who had a witnessed arrest while sitting on the couch, 5 minutes down time without CPR.  EMS found patient in [place] and pt was defibrillated 6 times and received 4 rounds EPI.  Intubated in the ED and PCCM consulted for admission  Pt was Intubated in the ED, CT head was negative for acute findings, he was initially hypotensive and started on Levophed.  Blood pressure improved and pressors were stopped and he became hypotensive.   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   Significant Hospital Events   7/25 Admit to PCCM 7/26 Being actively cooled on Arctic sun pads with goal normothermia 8/01 On vent, no change in mental status 8/6 trach  8/9 PEG  Consults:  Cardiology Neurology  CCS Procedures:  7/25 ETT >> 8/6 tracheostomy  8/9 PEG >   Significant Diagnostic Tests:  CT head 7/5 >> no acute findings, white matter disease, greatest in the left frontal lobe Echo 7/26 >> EF 25 to 30%, global hypokinesis, reduced RV SF MRI brain 7/29 >> no strong evidence of anoxic brain injury at this time, no acute intracranial abnormality, advanced chronic small vessel disease  Micro Data:  7/25 SARS-CoV-2 >> negative 7/30 Resp >> moderate klebsiella pneumoniae >> S-cefazolin  8/8 tracheal aspirate> no WBC, no orgs  8/9 CCS consulted for PEG vs Gtube evaluation   Antimicrobials:  Cefepime 7/30 >> 8/1  Cefazolin 8/1 >> 8/6  Interim history/subjective:  No acute events. Tolerating PSV 10/5 this AM.  Objective   Blood pressure 135/62, pulse 70, temperature 98.2 F (36.8 C), resp. rate 19,  height 5\' 8"  (1.727 m), weight 81.2 kg, SpO2 100 %.    Vent Mode: PSV;CPAP FiO2 (%):  [40 %-50 %] 40 % Set Rate:  [16 bmp] 16 bmp Vt Set:  [550 mL] 550 mL PEEP:  [5 cmH20] 5 cmH20 Pressure Support:  [10 RWE31-54 cmH20] 10 cmH20 Plateau Pressure:  [13 cmH20-18 cmH20] 13 cmH20   Intake/Output Summary (Last 24 hours) at 07/15/2020 0816 Last data filed at 07/15/2020 0700 Gross per 24 hour  Intake 1737.31 ml  Output 1850 ml  Net -112.69 ml   Filed Weights   07/13/20 0500 07/14/20 0442 07/15/20 0500  Weight: 80.1 kg 80.1 kg 81.2 kg   General: Adult male, resting in bed, in NAD. Neuro: Opens eyes intermittently but not following commands. HEENT: [place]/AT. Sclerae anicteric. Trach C/D/I.Marland Kitchen Cardiovascular: RRR, no M/R/G.  Lungs: Respirations even and unlabored.  CTA bilaterally, No W/R/R.  Abdomen: BS x 4, soft, NT/ND.  Musculoskeletal: No gross deformities, no edema.  Skin: Intact, warm, no rashes.   Assessment & Plan:   Encephalopathy: suspected anoxic encephalopathy in setting of cardiac arrest.  LTM EEG neg for seizures (7/26), MRI without evidence of anoxic injury (7/29).  Completed TTM protocol.  Last sedation 7/31. Neurology thinks there is reasonable chance of recovery over next couple months.  Family would like this trial with trach/PEG and LTACH. -possible that there is also degree of metabolic encephalopathy with worsening AKI. Mild hyperammonemia is improving   P - Limit sedatives. - Continue lactulose QD, continue  to trend ammonia.   Acute Hypoxic Respiratory Failure in setting of Cardiac Arrest - s/p trach. COPD , Tobacco abuse. P - Continue routine trach care. - Trach sutures out 8/11. - Continue efforts at working toward ATC. Currently tolerating PSV trial well at 10/5. - BD's. - CXR intermittently.  VF Arrest in setting of Ischemic Cardiomyopathy.  - appreciate cardiology following. - no obvious need for AC at present; given tongue/trach bleeding, will switch to  ppx dose.  - Can consider LHC down the line if improves neurologically. - Depending on neuro recovery, might be a candidate for ICD.  Atrial fibrillation, improved.  Has not had recurrence despite being off amio.   - Continue SQ heparin only for now.  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  - continue coreg, lipitor, isorsorbide. - Hold lisinopril, lasix due to ongoing AKI.  AKI. - continue to hold diuretic. - trend renal indices, UOP.  - will start IVF 8/9 given NPO status.   Dysphagia - s/p PEG 8/9. - TF's.  Self inflicted tongue trauma, secretions - ENT feltnothing to be done from surgical standpoint - Continue scop patch.   Prediabetes (Hgb A1c 6.1). - follow glucose trend.  - SSI, moderate scale.   Leukocytosis - ongoing despite course of abx for klebsiella PNA - Continue cefazolin through 8/11. - PRN APAP.  Best practice:  Diet: TF's. Pain/Anxiety/Delirium protocol (if indicated): limit as able VAP protocol (if indicated): HOB   DVT prophylaxis: Heparin subQ GI prophylaxis: protonix Glucose control: SSI Mobility: bed rest Code Status: full code Family Communication: Will try to call. Disposition: ICU, can start working toward [facility] placement   CC time: 30 min.   Montey Hora, [facility] 07/15/2020, 8:29 AM

## 2020-07-15 NOTE — TOC Initial Note (Signed)
Transition of Care [facility]) - Initial/Assessment Note    Patient Details  Name: Christopher [name] MRN: [id number] Date of Birth: 22-Oct-1946  Transition of Care [facility]) CM/SW Contact:    Curlene Labrum, RN Phone Number: 07/15/2020, 2:34 PM  Clinical Narrative:                 Case Management called and spoke with Lesia Sago, [facility] with [facility] and open LTAC beds are open to the patient for possible admission.  I called and spoke with the physician, Dr. Elsworth Soho and he states that patient may transfer if patient's family is in agreement.  I called and spoke with the daughter, Glendal Cassaday, to offer Medicare choice regarding LTAC placement for the patient since open beds are available for the patient on [facility] if family chooses.  The daughter, Orson Slick, stated that she was previously employed by [employer] and was trying to make a decision about the matter after speaking with the patient's sister, [place].  I offered support to the daughter and told her that I would follow up with her tomorrow if she would like the patient to transfer to [facility].  Will continue to follow and Lesia Sago, [facility] and Polly Cobia, [facility] are both aware and updated that the family would like time to make a decision for what is best care for the patient.    Barriers to Discharge: Continued Medical Work up (Daughter deciding about possible LTAC placement for patient)   Patient Goals and CMS Choice Patient states their goals for this hospitalization and ongoing recovery are:: Possible LTAC for patient to support vent/PEG nursing support and care. CMS Medicare.gov Compare Post Acute Care list provided to:: Patient Represenative (must comment) (Daughter, Film/video editor) Choice offered to / list presented to : [facility] POA / Guardian  Expected Discharge Plan and Services     Discharge Planning Services: CM Consult Post Acute Care Choice: Long Term Acute Care (LTAC) Living arrangements for the  past 2 months: Single Family Home                                      Prior Living Arrangements/Services Living arrangements for the past 2 months: Single Family Home Lives with:: Self Patient language and need for interpreter reviewed:: Yes Do you feel safe going back to the place where you live?: Yes      Need for Family Participation in Patient Care: Yes (Comment) Care giver support system in place?: Yes (comment)   Criminal Activity/Legal Involvement Pertinent to Current Situation/Hospitalization: No - Comment as needed  Activities of Daily Living Home Assistive Devices/Equipment: None ADL Screening (condition at time of admission) Patient's cognitive ability adequate to safely complete daily activities?: No Is the patient deaf or have difficulty hearing?: No Does the patient have difficulty seeing, even when wearing glasses/contacts?: No Does the patient have difficulty concentrating, remembering, or making decisions?: No Patient able to express need for assistance with ADLs?: No Does the patient have difficulty dressing or bathing?: Yes Independently performs ADLs?: No Does the patient have difficulty walking or climbing stairs?: Yes Weakness of Legs: Both Weakness of Arms/Hands: Both  Permission Sought/Granted Permission sought to share information with : Case Manager Permission granted to share information with : Yes, Verbal Permission Granted     Permission granted to share info w AGENCY: LTAC choice  Permission granted to share info w Relationship: daughter  Emotional Assessment Appearance:: Appears stated age         Psych Involvement: No (comment)  Admission diagnosis:  Cardiac arrest [facility]) [I46.9] Patient Active Problem List   Diagnosis Date Noted  . Status post tracheostomy ([place])   . Anoxic encephalopathy ([place])   . PAF (paroxysmal atrial fibrillation) ([place])   . Palliative care by specialist   . Goals of care, counseling/discussion   .  Cardiac arrest ([place]) 06/29/2020  . Acute respiratory failure ([place])   . AKI (acute kidney injury) ([place])   . NSTEMI (non-ST elevated myocardial infarction) ([place])   . 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 HYPERLIPIDEMIA 08/23/2009  . MIGRAINE HEADACHE 08/23/2009  . HYPERTENSION 08/23/2009   PCP:  Thurman Coyer, MD Pharmacy:   [id number] - [place], [place] [place] [address] Phone: [phone] Fax: [phone]     Social Determinants of Health (SDOH) Interventions    Readmission Risk Interventions No flowsheet data found.

## 2020-07-15 NOTE — Progress Notes (Signed)
PHARMACY NOTE:  ANTIMICROBIAL RENAL DOSAGE ADJUSTMENT  Current antimicrobial regimen includes a mismatch between antimicrobial dosage and estimated renal function.  As per policy approved by the Pharmacy & Therapeutics and Medical Executive Committees, the antimicrobial dosage will be adjusted accordingly.  Current antimicrobial dosage:  Cefazolin 2g IV q8 hrs  Indication: tongue wound & possible HCAP  Renal Function: Estimated Creatinine Clearance: 28.6 mL/min (A) (by C-G formula based on SCr of 2.19 mg/dL (H)).    Antimicrobial dosage has been changed to:  Cefazolin 2g IV q12 hrs  Richardine Service, PharmD PGY2 Cardiology Pharmacy Resident Phone: [phone] 07/15/2020  11:31 AM  Please check [email or web address] for unit-specific pharmacy phone numbers.

## 2020-07-15 NOTE — Progress Notes (Signed)
  Echocardiogram 2D Echocardiogram has been performed.  Matilde Bash 07/15/2020, 10:19 AM

## 2020-07-15 NOTE — Final Consult Note (Signed)
Consultant Final Sign-Off Note    Assessment/Final recommendations  Christopher [name] is a 74 y.o. male followed by me for placement of percutaneous gastrostomy tube in the setting of cardiac arrest and anoxic encephalopathy. His G-tube site looks good this morning - no drainage, no bleeding, no erythema. Patient is tolerating his tube feeds at goal. Continue to flush G-tube with 20-30cc NS/sterile water every 4 hours, when TF are stopped, and after medications.   Wound care (if applicable): keep tube site clean and dry, change drain sponge PRN for contamination. May clean skin surrounding tube with warm water and mild soap.   Diet at discharge: per primary team   Activity at discharge: per primary team   Follow-up appointment:  None required    Pending results:  Unresulted Labs (From admission, onward) Comment          Start     Ordered   07/14/20 0500  Magnesium  Daily,   R     Question:  Specimen collection method  Answer:  Lab=Lab collect   07/13/20 1010   07/14/20 0500  Phosphorus  Daily,   R     Question:  Specimen collection method  Answer:  Lab=Lab collect   07/13/20 1010   07/13/20 7782  Basic metabolic panel  Daily,   R     Question:  Specimen collection method  Answer:  Lab=Lab collect   07/11/20 0845   07/03/20 0500  CBC  Daily,   R     Question:  Specimen collection method  Answer:  Lab=Lab collect   07/02/20 2050           Medication recommendations:   Other recommendations:    Thank you for allowing [place] to participate in the care of your patient!  Please consult [place] again if you have further needs for your patient.  Jill Alexanders 07/15/2020 7:33 AM    Subjective  On the vent FiO2 40%, 5 PEEP. Remains minimally responsive - opens eyes to loud voice/noxious stimuli. Not following commands. Not reliably localizing. TF running at 65 cc/hr. Having BMs.  Objective  Vital signs in last 24 hours: Temp:  [97.3 F (36.3 C)-99 F (37.2 C)] 98.2 F (36.8  C) (08/10 0400) Pulse Rate:  [31-83] 71 (08/10 0400) Resp:  [13-29] 22 (08/10 0400) BP: (87-143)/(42-58) 135/58 (08/10 0400) SpO2:  [88 %-100 %] 100 % (08/10 0400) FiO2 (%):  [40 %-50 %] 40 % (08/10 0400) Weight:  [81.2 kg] 81.2 kg (08/10 0500)  General: intubated, NAD CV: RRR, 74 bpm Pulm: CTAB Abd: soft, mild distention, G-tube site clean and dry w/ TF running, skin is without cellulitis, non-bloody liquid stool in rectal tube.  Pertinent labs and Studies: Recent Labs    07/13/20 0207 07/14/20 0111 07/15/20 0134  WBC 22.4* 21.8* 21.8*  HGB 8.8* 8.2* 8.0*  HCT 27.5* 26.1* 25.3*   BMET Recent Labs    07/14/20 0111 07/15/20 0134  NA 142 142  K 3.8 4.4  CL 109 108  CO2 22 18*  GLUCOSE 161* 207*  BUN 75* 92*  CREATININE 2.14* 2.19*  CALCIUM 9.1 9.3   No results for input(s): LABURIN in the last 72 hours. Results for orders placed or performed during the hospital encounter of 06/28/20  SARS Coronavirus 2 by RT PCR (hospital order, performed in [facility] hospital lab) Nasopharyngeal Nasopharyngeal Swab     Status: None   Collection Time: 06/29/20  1:30 AM   Specimen: Nasopharyngeal Swab  Result  Value Ref Range Status   SARS Coronavirus 2 NEGATIVE NEGATIVE Final    Comment: (NOTE) SARS-CoV-2 target nucleic acids are NOT DETECTED.  The SARS-CoV-2 RNA is generally detectable in upper and lower respiratory specimens during the acute phase of infection. The lowest concentration of SARS-CoV-2 viral copies this assay can detect is 250 copies / mL. A negative result does not preclude SARS-CoV-2 infection and should not be used as the sole basis for treatment or other patient management decisions.  A negative result may occur with improper specimen collection / handling, submission of specimen other than nasopharyngeal swab, presence of viral mutation(s) within the areas targeted by this assay, and inadequate number of viral copies (<250 copies / mL). A negative result  must be combined with clinical observations, patient history, and epidemiological information.  Fact Sheet for Patients:   StrictlyIdeas.no  Fact Sheet for Healthcare Providers: BankingDealers.co.za  This test is not yet approved or  cleared by the [place] FDA and has been authorized for detection and/or diagnosis of SARS-CoV-2 by FDA under an Emergency Use Authorization (EUA).  This EUA will remain in effect (meaning this test can be used) for the duration of the COVID-19 declaration under Section 564(b)(1) of the Act, 21 U.S.C. section 360bbb-3(b)(1), unless the authorization is terminated or revoked sooner.  Performed at [facility], [place] [address] [place], [place] [address]   MRSA PCR Screening     Status: None   Collection Time: 06/29/20  5:20 AM   Specimen: Nasopharyngeal  Result Value Ref Range Status   MRSA by PCR NEGATIVE NEGATIVE Final    Comment:        The GeneXpert MRSA Assay (FDA approved for NASAL specimens only), is one component of a comprehensive MRSA colonization surveillance program. It is not intended to diagnose MRSA infection nor to guide or monitor treatment for MRSA infections. Performed at [facility], [place] [address] [place], [place] [address]   Culture, respiratory     Status: None   Collection Time: 07/04/20 11:01 AM   Specimen: Tracheal Aspirate; Respiratory  Result Value Ref Range Status   Specimen Description TRACHEAL ASPIRATE  Final   Special Requests NONE  Final   Gram Stain   Final    FEW WBC PRESENT, PREDOMINANTLY PMN NO SQUAMOUS EPITHELIAL CELLS SEEN FEW GRAM NEGATIVE RODS RARE GRAM POSITIVE COCCI IN PAIRS RARE GRAM POSITIVE RODS Performed at [facility], [place] [address], [place],  [address]    Culture MODERATE KLEBSIELLA PNEUMONIAE  Final   Report Status 07/06/2020 FINAL  Final   Organism ID, Bacteria KLEBSIELLA PNEUMONIAE  Final       Susceptibility   Klebsiella pneumoniae - MIC*    AMPICILLIN >=32 RESISTANT Resistant     CEFAZOLIN <=4 SENSITIVE Sensitive     CEFEPIME <=0.12 SENSITIVE Sensitive     CEFTAZIDIME <=1 SENSITIVE Sensitive     CEFTRIAXONE <=0.25 SENSITIVE Sensitive     CIPROFLOXACIN <=0.25 SENSITIVE Sensitive     GENTAMICIN <=1 SENSITIVE Sensitive     IMIPENEM <=0.25 SENSITIVE Sensitive     TRIMETH/SULFA <=20 SENSITIVE Sensitive     AMPICILLIN/SULBACTAM 8 SENSITIVE Sensitive     PIP/TAZO <=4 SENSITIVE Sensitive     * MODERATE KLEBSIELLA PNEUMONIAE  Culture, respiratory (non-expectorated)     Status: None (Preliminary result)   Collection Time: 07/13/20 10:33 AM   Specimen: Tracheal Aspirate; Respiratory  Result Value Ref Range Status   Specimen Description TRACHEAL ASPIRATE  Final  Special Requests NONE  Final   Gram Stain NO WBC SEEN NO ORGANISMS SEEN   Final   Culture   Final    CULTURE REINCUBATED FOR BETTER GROWTH Performed at [facility], [place] [address], [place], [place] [address]    Report Status PENDING  Incomplete    Imaging: No results found.

## 2020-07-15 NOTE — Progress Notes (Signed)
Progress Note  Patient Name: Christopher [name] Date of Encounter: 07/15/2020  [facility] Cardiologist: No primary care provider on file. New- Virl Axe MD  Subjective   Unresponsive on vent.   Inpatient Medications    Scheduled Meds: . acetaminophen (TYLENOL) oral liquid 160 mg/5 mL  650 mg Per Tube Q4H   Or  . acetaminophen  650 mg Rectal Q4H  . aspirin  81 mg Per Tube Daily  . atorvastatin  20 mg Per Tube Daily  . carvedilol  25 mg Per Tube BID WC  . chlorhexidine gluconate (MEDLINE KIT)  15 mL Mouth Rinse BID  . Chlorhexidine Gluconate Cloth  6 each Topical Daily  . docusate  100 mg Per Tube BID  . donepezil  5 mg Per Tube QHS  . feeding supplement (PROSource TF)  45 mL Per Tube Daily  . free water  100 mL Per Tube Q6H  . heparin injection (subcutaneous)  5,000 Units Subcutaneous Q8H  . insulin aspart  0-15 Units Subcutaneous Q4H  . ipratropium-albuterol  3 mL Nebulization TID  . isosorbide dinitrate  30 mg Per Tube TID  . lactulose  20 g Per Tube Daily  . mouth rinse  15 mL Mouth Rinse 10 times per day  . pantoprazole sodium  40 mg Per Tube Q1200  . polyethylene glycol  17 g Per Tube Daily  . scopolamine  1 patch Transdermal Q72H   Continuous Infusions: . sodium chloride Stopped (07/11/20 0207)  .  ceFAZolin (ANCEF) IV 2 g (07/15/20 0708)  . feeding supplement (VITAL AF 1.2 CAL) 1,000 mL (07/14/20 1813)  . lactated ringers Stopped (07/14/20 1308)   PRN Meds: acetaminophen, fentaNYL (SUBLIMAZE) injection, pneumococcal 23 valent vaccine, polyethylene glycol   Vital Signs    Vitals:   07/15/20 0600 07/15/20 0700 07/15/20 0740 07/15/20 0804  BP: 133/72 (!) 149/67 (!) 149/67 135/62  Pulse: 72 (!) 56 69 70  Resp: '20 17 19   ' Temp:      TempSrc:      SpO2: 100% 100% 100%   Weight:      Height:        Intake/Output Summary (Last 24 hours) at 07/15/2020 0819 Last data filed at 07/15/2020 0700 Gross per 24 hour  Intake 1737.31 ml  Output 1850 ml  Net  -112.69 ml   Last 3 Weights 07/15/2020 07/14/2020 07/13/2020  Weight (lbs) 179 lb 0.2 oz 176 lb 9.4 oz 176 lb 9.4 oz  Weight (kg) 81.2 kg 80.1 kg 80.1 kg      Telemetry    NSR with PACs. No Afib.- Personally Reviewed  ECG    NSR with LVH and repolarization abnormality. QTc is normal.- Personally Reviewed  Physical Exam   GEN: No acute distress.   Neck: No JVD. Trach tube in place Cardiac: RRR, no murmurs, rubs, or gallops.  Respiratory: Clear to auscultation bilaterally. GI: Soft, nontender, non-distended. G tube now in place MS: No edema; No deformity. Neuro:  blinks eyes. Moves extremities. No purposeful response.   Labs    High Sensitivity Troponin:   Recent Labs  Lab 06/29/20 0009 06/29/20 0209  TROPONINIHS 344* 1,322*      Chemistry Recent Labs  Lab 07/12/20 0033 07/12/20 0033 07/13/20 0207 07/14/20 0111 07/15/20 0134  NA 140   < > 141 142 142  K 4.2   < > 3.8 3.8 4.4  CL 106   < > 106 109 108  CO2 23   < > 21*  22 18*  GLUCOSE 165*   < > 169* 161* 207*  BUN 51*   < > 64* 75* 92*  CREATININE 1.55*   < > 1.78* 2.14* 2.19*  CALCIUM 9.1   < > 9.0 9.1 9.3  PROT 6.2*  --   --   --   --   ALBUMIN 2.1*  --   --   --   --   AST 69*  --   --   --   --   ALT 18  --   --   --   --   ALKPHOS 118  --   --   --   --   BILITOT 0.5  --   --   --   --   GFRNONAA 44*   < > 37* 29* 29*  GFRAA 51*   < > 43* 34* 33*  ANIONGAP 11   < > 14 11 16*   < > = values in this interval not displayed.     Hematology Recent Labs  Lab 07/13/20 0207 07/14/20 0111 07/15/20 0134  WBC 22.4* 21.8* 21.8*  RBC 2.93* 2.74* 2.67*  HGB 8.8* 8.2* 8.0*  HCT 27.5* 26.1* 25.3*  MCV 93.9 95.3 94.8  MCH 30.0 29.9 30.0  MCHC 32.0 31.4 31.6  RDW 14.3 14.6 14.5  PLT 441* 419* 478*    BNPNo results for input(s): BNP, PROBNP in the last 168 hours.   DDimer No results for input(s): DDIMER in the last 168 hours.   Radiology    DG Chest 1 View  Result Date: 07/13/2020 CLINICAL DATA:   ARDS post CPR EXAM: CHEST  1 VIEW COMPARISON:  07/11/2020 chest radiograph. FINDINGS: Tracheostomy tube tip overlies the tracheal air column just below the thoracic inlet. Enteric tube enters stomach with the tip not seen on this image. Stable cardiomediastinal silhouette with normal heart size. No pneumothorax. No pleural effusion. Symmetric mild prominence of the hila bilaterally, unchanged. No pulmonary edema. Mild bibasilar atelectasis, similar. IMPRESSION: 1. Well-positioned support structures. 2. Stable mild bibasilar atelectasis. 3. Stable symmetric prominence of the hila, cannot exclude adenopathy. Chest CT with IV contrast may be obtained for further evaluation as clinically warranted. Electronically Signed   By: Ilona Sorrel M.D.   On: 07/13/2020 12:38   DG CHEST PORT 1 VIEW  Result Date: 07/15/2020 CLINICAL DATA:  Respiratory failure EXAM: PORTABLE CHEST 1 VIEW COMPARISON:  07/13/2020 FINDINGS: Tracheostomy tube is again noted and stable. Feeding catheter has been removed in the interval. The lungs are well aerated bilaterally. Mild right basilar atelectasis is seen. Central hilar prominence is again noted likely related to vascularity. No new focal infiltrate is seen. No pneumothorax is noted. IMPRESSION: Mild right basilar atelectasis. Electronically Signed   By: Inez Catalina M.D.   On: 07/15/2020 08:10    Cardiac Studies   Echo 06/30/20: IMPRESSIONS    1. There are cystic structures noted in the liver. Dedicated RUQ  ultrasound is recommended for better evaluation.  2. Left ventricular ejection fraction, by estimation, is 25 to 30%. The  left ventricle has severely decreased function. The left ventricle  demonstrates global hypokinesis. Left ventricular diastolic parameters are  consistent with Grade I diastolic  dysfunction (impaired relaxation).  3. Right ventricular systolic function is mildly reduced. The right  ventricular size is mildly enlarged. Tricuspid regurgitation  signal is  inadequate for assessing PA pressure.  4. Left atrial size was moderately dilated.  5. The mitral valve is grossly normal.  Trivial mitral valve  regurgitation.  6. The aortic valve is tricuspid. Aortic valve regurgitation is not  visualized. No aortic stenosis is present.   Patient Profile     74 y.o. male admitted with a VF arrest and now with severe anoxic encephalopathy remains with VDRF  Assessment & Plan    1.  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 Ep could see post cath but unlikely candidate for ICD unless there is significant neurologic recovery Now s/p PEG tube placement.  2. Cardiomyopathy EF 25-30% on admit Possibly due to cardiac arrest versus Etoh abuse. Doubt ischemic.  Reassess EF now to see how much of LV dysfunction was post arrest.  Continue coreg, nitrates, and losartan Will hold off on hydralazine for now due to soft BP  3. Acute on chronic renal failure No changes- plans per CCM  4. afib Likely due to acute medical illness I am not convinced that he warrants long term anticoagulation unless if afib recurs  No recurrence off amiodarone  6. Anoxic brain injury Prognosis is guarded    For questions or updates, please contact [place] Please consult [email or web address] for contact info under        Signed, Eithen Castiglia [place], MD  07/15/2020, 8:19 AM

## 2020-07-16 ENCOUNTER — Inpatient Hospital Stay
Admission: AD | Admit: 2020-07-16 | Discharge: 2020-08-12 | Disposition: A | Discharge status: Skilled Nursing Facility | Payer: Medicare Other | Source: Other Acute Inpatient Hospital | Attending: Internal Medicine | Admitting: Internal Medicine

## 2020-07-16 ENCOUNTER — Other Ambulatory Visit (HOSPITAL_COMMUNITY): Payer: Self-pay

## 2020-07-16 DIAGNOSIS — J9621 Acute and chronic respiratory failure with hypoxia: Secondary | ICD-10-CM | POA: Diagnosis present

## 2020-07-16 DIAGNOSIS — R509 Fever, unspecified: Secondary | ICD-10-CM

## 2020-07-16 DIAGNOSIS — J189 Pneumonia, unspecified organism: Secondary | ICD-10-CM

## 2020-07-16 DIAGNOSIS — D72829 Elevated white blood cell count, unspecified: Secondary | ICD-10-CM

## 2020-07-16 DIAGNOSIS — N170 Acute kidney failure with tubular necrosis: Secondary | ICD-10-CM | POA: Diagnosis present

## 2020-07-16 DIAGNOSIS — G931 Anoxic brain damage, not elsewhere classified: Secondary | ICD-10-CM | POA: Diagnosis present

## 2020-07-16 DIAGNOSIS — I482 Chronic atrial fibrillation, unspecified: Secondary | ICD-10-CM | POA: Diagnosis present

## 2020-07-16 DIAGNOSIS — Z931 Gastrostomy status: Secondary | ICD-10-CM

## 2020-07-16 DIAGNOSIS — I469 Cardiac arrest, cause unspecified: Secondary | ICD-10-CM | POA: Diagnosis present

## 2020-07-16 HISTORY — DX: Cardiac arrest, cause unspecified: I46.9

## 2020-07-16 HISTORY — DX: Anoxic brain damage, not elsewhere classified: G93.1

## 2020-07-16 HISTORY — DX: Acute kidney failure with tubular necrosis: N17.0

## 2020-07-16 HISTORY — DX: Chronic atrial fibrillation, unspecified: I48.20

## 2020-07-16 HISTORY — DX: Acute and chronic respiratory failure with hypoxia: J96.21

## 2020-07-16 LAB — BASIC METABOLIC PANEL
Anion gap: 13 (ref 5–15)
BUN: 95 mg/dL — ABNORMAL HIGH (ref 8–23)
CO2: 21 mmol/L — ABNORMAL LOW (ref 22–32)
Calcium: 9.2 mg/dL (ref 8.9–10.3)
Chloride: 112 mmol/L — ABNORMAL HIGH (ref 98–111)
Creatinine, Ser: 1.93 mg/dL — ABNORMAL HIGH (ref 0.61–1.24)
GFR calc Af Amer: 39 mL/min — ABNORMAL LOW (ref >60)
GFR calc non Af Amer: 33 mL/min — ABNORMAL LOW (ref >60)
Glucose, Bld: 167 mg/dL — ABNORMAL HIGH (ref 70–99)
Potassium: 3.7 mmol/L (ref 3.5–5.1)
Sodium: 146 mmol/L — ABNORMAL HIGH (ref 135–145)

## 2020-07-16 LAB — CBC
HCT: 28.7 % — ABNORMAL LOW (ref 39.0–52.0)
Hemoglobin: 8.8 g/dL — ABNORMAL LOW (ref 13.0–17.0)
MCH: 28.9 pg (ref 26.0–34.0)
MCHC: 30.7 g/dL (ref 30.0–36.0)
MCV: 94.4 fL (ref 80.0–100.0)
Platelets: 524 10*3/uL — ABNORMAL HIGH (ref 150–400)
RBC: 3.04 MIL/uL — ABNORMAL LOW (ref 4.22–5.81)
RDW: 14.5 % (ref 11.5–15.5)
WBC: 16.9 10*3/uL — ABNORMAL HIGH (ref 4.0–10.5)
nRBC: 0 % (ref 0.0–0.2)

## 2020-07-16 LAB — GLUCOSE, CAPILLARY
Glucose-Capillary: 123 mg/dL — ABNORMAL HIGH (ref 70–99)
Glucose-Capillary: 128 mg/dL — ABNORMAL HIGH (ref 70–99)
Glucose-Capillary: 140 mg/dL — ABNORMAL HIGH (ref 70–99)
Glucose-Capillary: 157 mg/dL — ABNORMAL HIGH (ref 70–99)

## 2020-07-16 LAB — PHOSPHORUS: Phosphorus: 4 mg/dL (ref 2.5–4.6)

## 2020-07-16 LAB — MAGNESIUM: Magnesium: 2.4 mg/dL (ref 1.7–2.4)

## 2020-07-16 LAB — PROCALCITONIN: Procalcitonin: 0.32 ng/mL

## 2020-07-16 MED ORDER — POLYETHYLENE GLYCOL 3350 17 G PO PACK: 17.0000 g | PACK | Freq: Every day | ORAL | 0 refills | Status: AC | PRN

## 2020-07-16 MED ORDER — ATORVASTATIN CALCIUM 20 MG PO TABS: 20.0000 mg | ORAL_TABLET | Freq: Every day | ORAL | 0 refills | Status: AC

## 2020-07-16 MED ORDER — INSULIN ASPART 100 UNIT/ML ~~LOC~~ SOLN: 0.0000 [IU] | SUBCUTANEOUS | 11 refills | Status: AC

## 2020-07-16 MED ORDER — DONEPEZIL HCL 5 MG PO TABS: 5.0000 mg | ORAL_TABLET | Freq: Every day | ORAL | 0 refills | Status: AC

## 2020-07-16 MED ORDER — POTASSIUM CHLORIDE 20 MEQ/15ML (10%) PO SOLN: 20.0000 meq | Freq: Once | ORAL | Status: DC

## 2020-07-16 MED ORDER — CHLORHEXIDINE GLUCONATE 0.12% ORAL RINSE (MEDLINE KIT): 15.0000 mL | Freq: Two times a day (BID) | OROMUCOSAL | 0 refills | Status: AC

## 2020-07-16 MED ORDER — SCOPOLAMINE 1 MG/3DAYS TD PT72: 1.0000 | MEDICATED_PATCH | TRANSDERMAL | 12 refills | Status: AC

## 2020-07-16 MED ORDER — HEPARIN SODIUM (PORCINE) 5000 UNIT/ML IJ SOLN: 5000.0000 [IU] | Freq: Three times a day (TID) | INTRAMUSCULAR | 0 refills | Status: AC

## 2020-07-16 MED ORDER — CARVEDILOL 25 MG PO TABS: 25.0000 mg | ORAL_TABLET | Freq: Two times a day (BID) | ORAL | 0 refills | Status: AC

## 2020-07-16 MED ORDER — HYDRALAZINE HCL 25 MG PO TABS: 25.0000 mg | ORAL_TABLET | Freq: Three times a day (TID) | ORAL | 0 refills | Status: AC

## 2020-07-16 MED ORDER — ACETAMINOPHEN 160 MG/5ML PO SOLN: 650.0000 mg | ORAL | 0 refills | Status: AC

## 2020-07-16 MED ORDER — POLYETHYLENE GLYCOL 3350 17 G PO PACK: 17.0000 g | PACK | Freq: Every day | ORAL | 0 refills | Status: AC

## 2020-07-16 MED ORDER — POTASSIUM CHLORIDE 20 MEQ/15ML (10%) PO SOLN
20.0000 meq | Freq: Once | ORAL | Status: AC
  Administered 2020-07-16: 20 meq
  Filled 2020-07-16: qty 15

## 2020-07-16 MED ORDER — ASPIRIN 81 MG PO CHEW: 81.0000 mg | CHEWABLE_TABLET | Freq: Every day | ORAL | 0 refills | Status: AC

## 2020-07-16 MED ORDER — LACTULOSE 10 GM/15ML PO SOLN: 20.0000 g | Freq: Every day | ORAL | 0 refills | Status: AC

## 2020-07-16 MED ORDER — FREE WATER: 100.0000 mL | Freq: Four times a day (QID) | 0 refills | Status: AC

## 2020-07-16 MED ORDER — ORAL CARE MOUTH RINSE: 15.0000 mL | Freq: Four times a day (QID) | OROMUCOSAL | 0 refills | Status: AC

## 2020-07-16 MED ORDER — ACETAMINOPHEN 650 MG RE SUPP: 650.0000 mg | RECTAL | 0 refills | Status: AC

## 2020-07-16 MED ORDER — HYDRALAZINE HCL 25 MG PO TABS
25.0000 mg | ORAL_TABLET | Freq: Three times a day (TID) | ORAL | Status: DC
  Administered 2020-07-16: 25 mg
  Filled 2020-07-16: qty 1

## 2020-07-16 MED ORDER — HYDRALAZINE HCL 25 MG PO TABS: 25.0000 mg | ORAL_TABLET | Freq: Three times a day (TID) | ORAL | Status: DC

## 2020-07-16 MED ORDER — PANTOPRAZOLE SODIUM 40 MG PO PACK: 40.0000 mg | PACK | Freq: Every day | ORAL | 0 refills | Status: AC

## 2020-07-16 MED ORDER — DOCUSATE SODIUM 50 MG/5ML PO LIQD: 100.0000 mg | Freq: Two times a day (BID) | ORAL | 0 refills | Status: AC

## 2020-07-16 MED ORDER — ACETAMINOPHEN 160 MG/5ML PO SOLN: 650.0000 mg | Freq: Four times a day (QID) | ORAL | 0 refills | Status: AC | PRN

## 2020-07-16 MED ORDER — PROSOURCE TF PO LIQD: 45.0000 mL | Freq: Every day | ORAL | 0 refills | Status: AC

## 2020-07-16 MED ORDER — ISOSORBIDE DINITRATE 30 MG PO TABS: 30.0000 mg | ORAL_TABLET | Freq: Three times a day (TID) | ORAL | 0 refills | Status: AC

## 2020-07-16 MED ORDER — VITAL AF 1.2 CAL PO LIQD: 1000.0000 mL | ORAL | 0 refills | Status: AC

## 2020-07-16 MED ORDER — IOHEXOL 300 MG/ML  SOLN
50.0000 mL | Freq: Once | INTRAMUSCULAR | Status: AC | PRN
  Administered 2020-07-16: 50 mL

## 2020-07-16 MED ORDER — IPRATROPIUM-ALBUTEROL 0.5-2.5 (3) MG/3ML IN SOLN: 3.0000 mL | Freq: Three times a day (TID) | RESPIRATORY_TRACT | 0 refills | Status: AC

## 2020-07-16 NOTE — TOC Transition Note (Addendum)
Transition of Care [facility]) - CM/SW Discharge Note   Patient Details  Name: Christopher [name] MRN: [id number] Date of Birth: Apr 23, 1946  Transition [facility] Care [facility]) CM/SW Contact:  Curlene Labrum, RN Phone Number: 07/16/2020, 9:06 AM   Clinical Narrative:    Lesia Sago, RNCM with [facility] called to give available [facility] number for [facility] - [phone] and report to be called to [phone].  Dr. Laren Everts is the attending physican for Dr. Elsworth Soho to transfer care today today once patient is ready for discharge.  Will continue to follow for discharge.  07/16/2020 1000 - I spoke with the daughter this morning and notified her that the patient would be transferring to [facility] this morning and that Lesia Sago would be reaching out to her to communicate the same regarding the transition of care to [facility].  Final next level of care: Long Term Acute Care (LTAC) Barriers to Discharge: Continued Medical Work up (Daughter deciding about possible LTAC placement for patient)   Patient Goals and CMS Choice Patient states their goals for this hospitalization and ongoing recovery are:: Possible LTAC for patient to support vent/PEG nursing support and care. CMS Medicare.gov Compare Post Acute Care list provided to:: Patient Represenative (must comment) (Daughter, Film/video editor) Choice offered to / list presented to : [place] / [place]  Discharge Placement                       Discharge Plan and Services   Discharge Planning Services: CM Consult Post Acute Care Choice: Long Term Acute Care (LTAC)                               Social Determinants of Health (SDOH) Interventions     Readmission Risk Interventions No flowsheet data found.

## 2020-07-16 NOTE — Progress Notes (Signed)
Progress Note  Patient Name: Christopher [name] Date of Encounter: 07/16/2020  [facility] Cardiologist: No primary care provider on file. New- Virl Axe MD  Subjective   Opens eyes to stimulation   Inpatient Medications    Scheduled Meds: . acetaminophen (TYLENOL) oral liquid 160 mg/5 mL  650 mg Per Tube Q4H   Or  . acetaminophen  650 mg Rectal Q4H  . aspirin  81 mg Per Tube Daily  . atorvastatin  20 mg Per Tube Daily  . carvedilol  25 mg Per Tube BID WC  . chlorhexidine gluconate (MEDLINE KIT)  15 mL Mouth Rinse BID  . Chlorhexidine Gluconate Cloth  6 each Topical Daily  . docusate  100 mg Per Tube BID  . donepezil  5 mg Per Tube QHS  . feeding supplement (PROSource TF)  45 mL Per Tube Daily  . free water  100 mL Per Tube Q6H  . heparin injection (subcutaneous)  5,000 Units Subcutaneous Q8H  . insulin aspart  0-15 Units Subcutaneous Q4H  . ipratropium-albuterol  3 mL Nebulization TID  . isosorbide dinitrate  30 mg Per Tube TID  . lactulose  20 g Per Tube Daily  . mouth rinse  15 mL Mouth Rinse 10 times per day  . pantoprazole sodium  40 mg Per Tube Q1200  . polyethylene glycol  17 g Per Tube Daily  . scopolamine  1 patch Transdermal Q72H   Continuous Infusions: . sodium chloride Stopped (07/11/20 0207)  .  ceFAZolin (ANCEF) IV Stopped (07/16/20 0047)  . feeding supplement (VITAL AF 1.2 CAL) 1,000 mL (07/16/20 4401)  . lactated ringers Stopped (07/14/20 1308)   PRN Meds: acetaminophen, fentaNYL (SUBLIMAZE) injection, pneumococcal 23 valent vaccine, polyethylene glycol   Vital Signs    Vitals:   07/16/20 0500 07/16/20 0600 07/16/20 0700 07/16/20 0740  BP: (!) 143/65 136/85 (!) 162/88   Pulse: 75 70 71 76  Resp: (!) 29 (!) 34 (!) 27 (!) 28  Temp:      TempSrc:      SpO2: 97% 97% 98% 98%  Weight: 81.9 kg     Height:        Intake/Output Summary (Last 24 hours) at 07/16/2020 0900 Last data filed at 07/16/2020 0600 Gross per 24 hour  Intake 1146.94 ml    Output 2650 ml  Net -1503.06 ml   Last 3 Weights 07/16/2020 07/15/2020 07/14/2020  Weight (lbs) 180 lb 8.9 oz 179 lb 0.2 oz 176 lb 9.4 oz  Weight (kg) 81.9 kg 81.2 kg 80.1 kg      Telemetry    NSR with PACs. No Afib.- Personally Reviewed  ECG    NSR with LVH and repolarization abnormality. QTc is normal.- Personally Reviewed  Physical Exam   GEN: No acute distress.   Neck: No JVD. Trach tube in place Cardiac: RRR, no murmurs, rubs, or gallops.  Respiratory: Clear to auscultation bilaterally. GI: Soft, nontender, non-distended. G tube now in place MS: No edema; No deformity. Neuro:  blinks eyes. Moves extremities. No purposeful response.   Labs    High Sensitivity Troponin:   Recent Labs  Lab 06/29/20 0009 06/29/20 0209  TROPONINIHS 344* 1,322*      Chemistry Recent Labs  Lab 07/12/20 0033 07/13/20 0207 07/14/20 0111 07/15/20 0134 07/16/20 0103  NA 140   < > 142 142 146*  K 4.2   < > 3.8 4.4 3.7  CL 106   < > 109 108 112*  CO2 23   < >  22 18* 21*  GLUCOSE 165*   < > 161* 207* 167*  BUN 51*   < > 75* 92* 95*  CREATININE 1.55*   < > 2.14* 2.19* 1.93*  CALCIUM 9.1   < > 9.1 9.3 9.2  PROT 6.2*  --   --   --   --   ALBUMIN 2.1*  --   --   --   --   AST 69*  --   --   --   --   ALT 18  --   --   --   --   ALKPHOS 118  --   --   --   --   BILITOT 0.5  --   --   --   --   GFRNONAA 44*   < > 29* 29* 33*  GFRAA 51*   < > 34* 33* 39*  ANIONGAP 11   < > 11 16* 13   < > = values in this interval not displayed.     Hematology Recent Labs  Lab 07/14/20 0111 07/15/20 0134 07/16/20 0103  WBC 21.8* 21.8* 16.9*  RBC 2.74* 2.67* 3.04*  HGB 8.2* 8.0* 8.8*  HCT 26.1* 25.3* 28.7*  MCV 95.3 94.8 94.4  MCH 29.9 30.0 28.9  MCHC 31.4 31.6 30.7  RDW 14.6 14.5 14.5  PLT 419* 478* 524*    BNPNo results for input(s): BNP, PROBNP in the last 168 hours.   DDimer No results for input(s): DDIMER in the last 168 hours.   Radiology    DG CHEST PORT 1 VIEW  Result  Date: 07/15/2020 CLINICAL DATA:  Respiratory failure EXAM: PORTABLE CHEST 1 VIEW COMPARISON:  07/13/2020 FINDINGS: Tracheostomy tube is again noted and stable. Feeding catheter has been removed in the interval. The lungs are well aerated bilaterally. Mild right basilar atelectasis is seen. Central hilar prominence is again noted likely related to vascularity. No new focal infiltrate is seen. No pneumothorax is noted. IMPRESSION: Mild right basilar atelectasis. Electronically Signed   By: Inez Catalina M.D.   On: 07/15/2020 08:10   ECHOCARDIOGRAM LIMITED  Result Date: 07/15/2020    ECHOCARDIOGRAM LIMITED REPORT   Patient Name:   Christopher [name] Date of Exam: 07/15/2020 Medical Rec #:  [id number]        Height:       68.0 in Accession #:    [id number]       Weight:       179.0 lb Date of Birth:  Jun 16, 1946         BSA:          1.950 m Patient Age:    74 years         BP:           135/62 mmHg Patient Gender: M                HR:           70 bpm. Exam Location:  Inpatient Procedure: Limited Echo, Cardiac Doppler and Color Doppler Indications:    Cardiomyopathy  History:        Patient has prior history of Echocardiogram examinations, most                 recent 06/30/2020. Arrythmias:Cardiac Arrest and Ventricular                 Fibrillation; Risk Factors:Hypertension. ETOH abuse, renal  failure.  Sonographer:    Dustin Flock Referring Phys: [id number] Rice Walsh M [place]  Sonographer Comments: Echo performed with patient supine and on artificial respirator. IMPRESSIONS  1. Left ventricular ejection fraction, by estimation, is 60 to 65%. The left ventricle has normal function. The left ventricle has no regional wall motion abnormalities. There is moderate concentric left ventricular hypertrophy. Left ventricular diastolic parameters are consistent with Grade I diastolic dysfunction (impaired relaxation).  2. Right ventricular systolic function is normal. The right ventricular size is normal. Tricuspid  regurgitation signal is inadequate for assessing PA pressure.  3. The mitral valve is normal in structure. No evidence of mitral valve regurgitation. No evidence of mitral stenosis.  4. The aortic valve is normal in structure. Aortic valve regurgitation is not visualized. No aortic stenosis is present. Comparison(s): Prior images reviewed side by side. The left ventricular function has improved. The right ventricular systolic function has improved. FINDINGS  Left Ventricle: Left ventricular ejection fraction, by estimation, is 60 to 65%. The left ventricle has normal function. The left ventricle has no regional wall motion abnormalities. The left ventricular internal cavity size was normal in size. There is  moderate concentric left ventricular hypertrophy. Indeterminate filling pressures. Right Ventricle: The right ventricular size is normal. No increase in right ventricular wall thickness. Right ventricular systolic function is normal. Tricuspid regurgitation signal is inadequate for assessing PA pressure. Left Atrium: Left atrial size was normal in size. Right Atrium: Right atrial size was normal in size. Pericardium: There is no evidence of pericardial effusion. Mitral Valve: The mitral valve is normal in structure. Normal mobility of the mitral valve leaflets. No evidence of mitral valve stenosis. Tricuspid Valve: The tricuspid valve is normal in structure. Tricuspid valve regurgitation is not demonstrated. No evidence of tricuspid stenosis. Aortic Valve: The aortic valve is normal in structure. Aortic valve regurgitation is not visualized. No aortic stenosis is present. Pulmonic Valve: The pulmonic valve was normal in structure. Pulmonic valve regurgitation is not visualized. No evidence of pulmonic stenosis. Aorta: The aortic root is normal in size and structure. Venous: IVC assessment for right atrial pressure unable to be performed due to mechanical ventilation. IAS/Shunts: No atrial level shunt detected by  color flow Doppler. LEFT VENTRICLE PLAX 2D LVIDd:         5.00 cm Diastology LVIDs:         3.50 cm LV e' lateral:   7.62 cm/s LV PW:         1.50 cm LV E/e' lateral: 10.3 LV IVS:        1.50 cm LV e' medial:    6.42 cm/s                        LV E/e' medial:  12.3  AORTIC VALVE LVOT Vmax:   123.00 cm/s LVOT Vmean:  79.000 cm/s LVOT VTI:    0.250 m MITRAL VALVE MV Area (PHT): 2.80 cm    SHUNTS MV Decel Time: 271 msec    Systemic VTI: 0.25 m MV E velocity: 78.80 cm/s MV A velocity: 78.00 cm/s MV E/A ratio:  1.01 Mihai Croitoru MD Electronically signed by Sanda Klein MD Signature Date/Time: 07/15/2020/11:19:26 AM    Final     Cardiac Studies   Echo 06/30/20: IMPRESSIONS    1. There are cystic structures noted in the liver. Dedicated RUQ  ultrasound is recommended for better evaluation.  2. Left ventricular ejection fraction, by estimation, is 25 to 30%. The  left ventricle has severely decreased function. The left ventricle  demonstrates global hypokinesis. Left ventricular diastolic parameters are  consistent with Grade I diastolic  dysfunction (impaired relaxation).  3. Right ventricular systolic function is mildly reduced. The right  ventricular size is mildly enlarged. Tricuspid regurgitation signal is  inadequate for assessing PA pressure.  4. Left atrial size was moderately dilated.  5. The mitral valve is grossly normal. Trivial mitral valve  regurgitation.  6. The aortic valve is tricuspid. Aortic valve regurgitation is not  visualized. No aortic stenosis is present.   Echo 07/15/20: IMPRESSIONS    1. Left ventricular ejection fraction, by estimation, is 60 to 65%. The  left ventricle has normal function. The left ventricle has no regional  wall motion abnormalities. There is moderate concentric left ventricular  hypertrophy. Left ventricular  diastolic parameters are consistent with Grade I diastolic dysfunction  (impaired relaxation).  2. Right ventricular systolic  function is normal. The right ventricular  size is normal. Tricuspid regurgitation signal is inadequate for assessing  PA pressure.  3. The mitral valve is normal in structure. No evidence of mitral valve  regurgitation. No evidence of mitral stenosis.  4. The aortic valve is normal in structure. Aortic valve regurgitation is  not visualized. No aortic stenosis is present.   Comparison(s): Prior images reviewed side by side. The left ventricular  function has improved. The right ventricular systolic function has  improved.   Patient Profile     74 y.o. male admitted with a VF arrest and now with severe anoxic encephalopathy remains with VDRF  Assessment & Plan    1.  VF arrest- now day 17 post arrest S/p defibrillation in the field with at least 15 minute downtime Anoxic brain injury persists Prognosis is guarded Consider cath if he makes meaningful recovery- but unlikely this admission with plans to go to LTAC. Now s/p PEG tube placement. If he does have significant improvement in Neurologic function would recommend outpatient ischemic cardiac evaluation.   2. Cardiomyopathy EF 25-30% on admit Repeat Echo yesterday showed complete normalization of LV function suggesting initial low EF due to arrest.  Doubt ischemic.  Continue coreg, nitrates. Losartan held due fluctuating renal function. BP is high now. Will add low dose hydralazine 25 mg tid.   3. Acute on chronic renal failure Fluctuating. ARB on hold. - plans per CCM  4. afib Likely due to acute medical illness No recurrence off amiodarone  6. Anoxic brain injury Prognosis is guarded  [facility] will sign off.   Medication Recommendations:  As per [facility] Other recommendations (labs, testing, etc):  None unless there is significant Neurologic improvement in which case out patient ischemic evaluation recommended Follow up as an outpatient:  With Dr Caryl Comes if Neurologic status improves.     For questions or  updates, please contact [place] Please consult [email or web address] for contact info under        Signed, Cinde Ebert [place], MD  07/16/2020, 9:00 AM

## 2020-07-16 NOTE — Discharge Summary (Signed)
Physician Discharge Summary  Patient ID: Christopher [name] MRN: [id number] DOB/AGE: 1946-02-14 74 y.o.  Admit date: 06/28/2020 Discharge date: 07/16/2020    Discharge Diagnoses:  Encephalopathy suspected anoxic encephalopathy in the setting of cardiac arrest Acute hypoxic respiratory failure in the setting of cardiac arrest COPD Tobacco abuse V. 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 Last sedation 7/31 -LTM EEG neg for seizures (7/26),  -MRI without evidence of anoxic injury (7/29).   -Completed TTM protocol. Neurology thinks there is reasonable chance of recovery over next couple months.  Family would like this trial with trach/PEG and LTACH. -possible that there is also degree of metabolic encephalopathy with worsening AKI. Mild hyperammonemia is improving   P: Patient remains stable for discharge as of 8/11 Remains minimally responsive Limit sedation as able Continue scheduled lactulose at discharge Recommend continue to trend ammonia Continue supportive care  Acute Hypoxic Respiratory Failure in setting of Cardiac Arrest  - s/p trach. COPD , Tobacco abuse. P: Continue routine trach care upon discharge Patient was successfully transitioned to ATC morning of 8/10 and has remained on ATC since Sutures removed from trach 8/11 Recommended continuing efforts to wean to ATC at [facility] patient currently tolerating PSV trials well Continue bronchodilators Recommended continued following intermittent chest x-ray  VF Arrest in setting of Ischemic Cardiomyopathy.  -Status post TTM P: Cardiology consulted during admission,  appreciate assistance No acute indication or need for anticoagulation at present especially in the setting of tongue/trach bleeding Patient will need to have future discussion of left heart cath once neurologically improves Can also consider possible placement of ICD in the future if neurologically recovers  Atrial fibrillation, improved.   -Patient has had no recurrent episodes of atrial fibrillation despite being off amiodarone P: Would recommend continuing subcu heparin upon discharge to LTAC Has not had recurrence despite being off amio.   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  P: Recommend continuing Coreg, Lipitor, and etoposide upon discharge Given continued AKI Lasix and lisinopril remain on hold at discharge  AKI, improving P: Diuretics remain on hold Trend intermittent male Follow urine output Continue free water  Dysphagia  - s/p PEG 8/9. P: Continue tube feeds and free water upon discharge  Self inflicted tongue trauma, secretions   -Patient seen biting tongue with ET tube in place at which time bite block was place -ENT felt nothing to be done from surgical standpoint P: Continue aggressive oral hygiene Scopolamine patch as needed for secretions  Prediabetes (Hgb A1c 6.1). P: Continue to follow glucose trends Continue sliding scale insulin upon discharge to LTAC  Leukocytosis  - ongoing despite course of abx for klebsiella PNA P: Cefazolin completed 8/11 As needed antipyretics Continue to trend CBC                   DISCHARGE SUMMARY   Christopher [name] is a 74 year old  male with a known history of COPD, tobacco abuse memory loss, hypertension and migraines who presented initially on 7/25 after being found down by friend after suffering a witnessed cardiac arrest.  Family called 911 there is approximately 5 minutes of downtime without CPR before EMS arrived.  On arrival patient was seen in  V. Fib.  EMS performed 6 defibrillations and administered 4 rounds of epi before ROSC was achieved.  Patient was transported to ER at which time he was intubated, patient did not receive any sedating medications prior to intubation and unfortunately showed no signs of responsiveness throughout ED course.  Initial head CT on admission was negative for any acute abnormalities.           Patient was started on targeted temperature management x24 hours with goal of normothermia.  Patient remained poorly responsive post TTM at which time neurology was consulted 8/2 and recommendations were made to continue monitoring neurological recovery given negative imaging.  MRI brain 7/29 and 8/2 was negative for any evidence of hypoxia or ischemic injury.  Patient continued to remain minimally responsive and therefore discussion was had with family regarding goals of care and decision was made to pursue tracheostomy and PEG tube placement.  Patient underwent percutaneous tracheostomy placement 8/6 and PEG placement 8/9.  During hospital stay patient has received antibiotic therapy 7/30 through 8/6 for persistent leukocytosis, fever, and thickened secretions with concern for HCAP.  Respiratory culture positive for moderate Klebsiella pneumonia 7/30. Additionally patient experienced acute kidney injury with rising creatinine in the setting of acute illness and diuretic therapy.  By 8/11 patient remained stable for discharge to [place].  Family is aware plan of care and agreeable for discharge.  SIGNIFICANT DIAGNOSTIC STUDIES CT head 7/5 >> no acute findings, white matter disease, greatest in the left frontal lobe Echo 7/26 >> EF 25 to 30%, global hypokinesis, reduced RV SF MRI brain 7/29 >> no strong evidence of anoxic brain injury at this time, no acute intracranial abnormality, advanced chronic small vessel disease MRI brain 8/2 > no evidence for acute anoxic/ischemic injury CT abdomen without contrast 8/5 > no acute  findings  MICRO DATA  7/25 SARS-CoV-2 >> negative 7/30 Resp >> moderate klebsiella pneumoniae >> S-cefazolin  8/8 tracheal aspirate> no WBC, no orgs   ANTIBIOTICS Cefepime 7/30 >> 8/1  Cefazolin 8/1 >> 8/6  CONSULTS Cardiology Neurology Palliative care Electrophysiology Trauma/general surgery  TUBES / LINES 8/6 tracheostomy 8/9 PEG tube  Discharge Exam: General: Chronically ill appearing thin deconditioned elderly male lying in bed on mechanical ventilation through tracheostomy ventilation, in NAD HEENT: #8 cuffed Shiley trach midline, MM pink/moist, PERRL,  Neuro: minimally responsive CV: s1s2 regular rate and rhythm, no murmur, rubs, or gallops,  PULM: Clear to auscultation bilaterally, no increased work of breathing, tolerating ATC well GI: soft, bowel sounds active in all 4 quadrants, non-tender, non-distended Extremities: warm/dry, no edema  Skin: no rashes or lesions   Vitals:   07/16/20 0740 07/16/20 0800 07/16/20 0900 07/16/20 0905  BP:  (!) 155/69 (!) 160/78   Pulse: 76 72 73   Resp: (!) 28 (!) 28 (!) 24   Temp:    99.7 F (37.6 C)  TempSrc:    Oral  SpO2: 98% 98% 97%   Weight:      Height:         Discharge Labs  BMET Recent Labs  Lab 07/11/20 0239 07/11/20 0239 07/12/20 0033 07/12/20 0033 07/13/20 0207 07/13/20 0207 07/14/20 0111 07/14/20 0111 07/15/20  0134 07/16/20 0103  NA 139   < > 140  --  141  --  142  --  142 146*  K 3.8   < > 4.2   < > 3.8   < > 3.8   < > 4.4 3.7  CL 105   < > 106  --  106  --  109  --  108 112*  CO2 23   < > 23  --  21*  --  22  --  18* 21*  GLUCOSE 136*   < > 165*  --  169*  --  161*  --  207* 167*  BUN 42*   < > 51*  --  64*  --  75*  --  92* 95*  CREATININE 1.28*   < > 1.55*  --  1.78*  --  2.14*  --  2.19* 1.93*  CALCIUM 9.1   < > 9.1  --  9.0  --  9.1  --  9.3 9.2  MG 2.1  --  2.1  --   --   --  2.4  --  2.5* 2.4  PHOS 3.6  --  4.2  --   --   --  4.5  --  5.5* 4.0   < > = values in this interval not  displayed.    CBC Recent Labs  Lab 07/14/20 0111 07/15/20 0134 07/16/20 0103  HGB 8.2* 8.0* 8.8*  HCT 26.1* 25.3* 28.7*  WBC 21.8* 21.8* 16.9*  PLT 419* 478* 524*    Anti-Coagulation Recent Labs  Lab 07/11/20 0239  INR 1.1          Allergies as of 07/16/2020      Reactions   Penicillins Rash      Medication List    STOP taking these medications   aspirin EC 81 MG tablet Replaced by: aspirin 81 MG chewable tablet   budesonide-formoterol 80-4.5 MCG/ACT inhaler Commonly known as: SYMBICORT   GLUCOSAMINE-CHONDROITIN PO   omeprazole 20 MG capsule Commonly known as: PRILOSEC   Potassium Chloride ER 20 MEQ Tbcr   SAW PALMETTO PO   valACYclovir 500 MG tablet Commonly known as: VALTREX     TAKE these medications   acetaminophen 160 MG/5ML solution Commonly known as: TYLENOL Place 20.3 mLs (650 mg total) into feeding tube every 4 (four) hours.   acetaminophen 650 MG suppository Commonly known as: TYLENOL Place 1 suppository (650 mg total) rectally every 4 (four) hours.   acetaminophen 160 MG/5ML solution Commonly known as: TYLENOL Place 20.3 mLs (650 mg total) into feeding tube every 6 (six) hours as needed for fever (Temperature >37.5).   aspirin 81 MG chewable tablet Place 1 tablet (81 mg total) into feeding tube daily. Start taking on: July 17, 2020 Replaces: aspirin EC 81 MG tablet   atorvastatin 20 MG tablet Commonly known as: LIPITOR Place 1 tablet (20 mg total) into feeding tube daily. Start taking on: July 17, 2020 What changed: how to take this   carvedilol 25 MG tablet Commonly known as: COREG Place 1 tablet (25 mg total) into feeding tube 2 (two) times daily with a meal.   chlorhexidine gluconate (MEDLINE KIT) 0.12 % solution Commonly known as: PERIDEX 15 mLs by Mouth Rinse route 2 (two) times daily.   [employer] SPECTRAVITE PO Take 1 tablet by mouth daily.   docusate 50 MG/5ML liquid Commonly known as: COLACE Place 10 mLs (100  mg total) into feeding tube 2 (two) times daily.  donepezil 5 MG tablet Commonly known as: ARICEPT Place 1 tablet (5 mg total) into feeding tube at bedtime. What changed: how to take this   feeding supplement (VITAL AF 1.2 CAL) Liqd Place 1,000 mLs into feeding tube continuous.   feeding supplement (PROSource TF) liquid Place 45 mLs into feeding tube daily. Start taking on: July 17, 2020   free water Soln Place 100 mLs into feeding tube every 6 (six) hours.   heparin 5000 UNIT/ML injection Inject 1 mL (5,000 Units total) into the skin every 8 (eight) hours.   hydrALAZINE 25 MG tablet Commonly known as: APRESOLINE Place 1 tablet (25 mg total) into feeding tube every 8 (eight) hours.   insulin aspart 100 UNIT/ML injection Commonly known as: novoLOG Inject 0-15 Units into the skin every 4 (four) hours.   ipratropium-albuterol 0.5-2.5 (3) MG/3ML Soln Commonly known as: DUONEB Take 3 mLs by nebulization 3 (three) times daily.   isosorbide dinitrate 30 MG tablet Commonly known as: ISORDIL Place 1 tablet (30 mg total) into feeding tube 3 (three) times daily.   lactulose 10 GM/15ML solution Commonly known as: CHRONULAC Place 30 mLs (20 g total) into feeding tube daily. Start taking on: July 17, 2020   mouth rinse Liqd solution 15 mLs by Mouth Rinse route in the morning, at noon, in the evening, and at bedtime.   pantoprazole sodium 40 mg/20 mL Pack Commonly known as: PROTONIX Place 20 mLs (40 mg total) into feeding tube daily at 12 noon.   polyethylene glycol 17 g packet Commonly known as: MIRALAX / GLYCOLAX Place 17 g into feeding tube daily as needed for moderate constipation.   polyethylene glycol 17 g packet Commonly known as: MIRALAX / GLYCOLAX Place 17 g into feeding tube daily. Start taking on: July 17, 2020   scopolamine 1 MG/3DAYS Commonly known as: TRANSDERM-SCOP Place 1 patch (1.5 mg total) onto the skin every 3 (three) days. Start taking on:  July 19, 2020   VITAMIN C PO Take 1 tablet by mouth daily.        Disposition:   Discharged Condition: FARHAD BURLESON has met maximum benefit of inpatient care and is medically stable and cleared for discharge.  Patient is pending follow up as above.      Time spent on disposition:  38 Minutes.   Signed: Johnsie Cancel, NP-C  Pulmonary & Critical Care Contact / Pager information can be found on Amion  07/16/2020, 10:15 AM

## 2020-07-16 NOTE — Plan of Care (Signed)
Patient remains unresponsive at this time.  Plan on transfer to [facility] today Problem: Health Behavior/Discharge Planning: Goal: Ability to manage health-related needs will improve Outcome: Not Progressing   Problem: Clinical Measurements: Goal: Diagnostic test results will improve Outcome: Progressing   Problem: Activity: Goal: Risk for activity intolerance will decrease Outcome: Not Progressing

## 2020-07-17 ENCOUNTER — Encounter: Payer: Self-pay | Admitting: Internal Medicine

## 2020-07-17 DIAGNOSIS — N170 Acute kidney failure with tubular necrosis: Secondary | ICD-10-CM

## 2020-07-17 DIAGNOSIS — G931 Anoxic brain damage, not elsewhere classified: Secondary | ICD-10-CM

## 2020-07-17 DIAGNOSIS — I469 Cardiac arrest, cause unspecified: Secondary | ICD-10-CM

## 2020-07-17 DIAGNOSIS — I482 Chronic atrial fibrillation, unspecified: Secondary | ICD-10-CM

## 2020-07-17 DIAGNOSIS — J9621 Acute and chronic respiratory failure with hypoxia: Secondary | ICD-10-CM

## 2020-07-17 LAB — BASIC METABOLIC PANEL
Anion gap: 11 (ref 5–15)
BUN: 85 mg/dL — ABNORMAL HIGH (ref 8–23)
CO2: 22 mmol/L (ref 22–32)
Calcium: 9.4 mg/dL (ref 8.9–10.3)
Chloride: 118 mmol/L — ABNORMAL HIGH (ref 98–111)
Creatinine, Ser: 1.68 mg/dL — ABNORMAL HIGH (ref 0.61–1.24)
GFR calc Af Amer: 46 mL/min — ABNORMAL LOW (ref >60)
GFR calc non Af Amer: 39 mL/min — ABNORMAL LOW (ref >60)
Glucose, Bld: 134 mg/dL — ABNORMAL HIGH (ref 70–99)
Potassium: 4.9 mmol/L (ref 3.5–5.1)
Sodium: 151 mmol/L — ABNORMAL HIGH (ref 135–145)

## 2020-07-17 LAB — CBC WITH DIFFERENTIAL/PLATELET
Abs Immature Granulocytes: 0.14 10*3/uL — ABNORMAL HIGH (ref 0.00–0.07)
Basophils Absolute: 0.1 10*3/uL (ref 0.0–0.1)
Basophils Relative: 0 %
Eosinophils Absolute: 0.2 10*3/uL (ref 0.0–0.5)
Eosinophils Relative: 1 %
HCT: 30.2 % — ABNORMAL LOW (ref 39.0–52.0)
Hemoglobin: 9.4 g/dL — ABNORMAL LOW (ref 13.0–17.0)
Immature Granulocytes: 1 %
Lymphocytes Relative: 7 %
Lymphs Abs: 1.1 10*3/uL (ref 0.7–4.0)
MCH: 29.7 pg (ref 26.0–34.0)
MCHC: 31.1 g/dL (ref 30.0–36.0)
MCV: 95.6 fL (ref 80.0–100.0)
Monocytes Absolute: 1.6 10*3/uL — ABNORMAL HIGH (ref 0.1–1.0)
Monocytes Relative: 10 %
Neutro Abs: 13 10*3/uL — ABNORMAL HIGH (ref 1.7–7.7)
Neutrophils Relative %: 81 %
Platelets: 569 10*3/uL — ABNORMAL HIGH (ref 150–400)
RBC: 3.16 MIL/uL — ABNORMAL LOW (ref 4.22–5.81)
RDW: 14.6 % (ref 11.5–15.5)
WBC: 16.2 10*3/uL — ABNORMAL HIGH (ref 4.0–10.5)
nRBC: 0 % (ref 0.0–0.2)

## 2020-07-17 LAB — AMMONIA: Ammonia: 33 umol/L (ref 9–35)

## 2020-07-17 NOTE — Consult Note (Signed)
Pulmonary [place]  Date of Service: 07/17/2020  PULMONARY CRITICAL CARE Christopher [name]  MLJ:[id number]  DOB: [birth year]   DOA: 07/16/2020  Referring Physician: Merton Border, MD  HPI: Christopher [name] is a 74 y.o. male seen for follow up of Acute on Chronic Respiratory Failure.  Patient has multiple medical problems including COPD tobacco abuse hypertension migraines came into the hospital because after having been found after a cardiac arrest.  Patient was down for approximately 5 minutes before CPR had been started.  Patient was initially found to be in V. fib arrest EMS performed shocking following ACLS protocol.  The patient was transported to the ED was intubated at that time and has subsequently not been able to wean.  Patient subsequently had a tracheostomy done and was transferred to our facility for further management and weaning.  Right now is on T collar on 28% FiO2.  Family was present in the room really has not had much in the way of improvement.  Review of Systems:  ROS performed and is unremarkable other than noted above.  Past Medical History:  Diagnosis Date  . Colon polyp   . Hypertension   . 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 Migraine   . Mononucleosis 1990s  . Rash   . Shortness of breath ~1998   Pneumonia   . Skin moles    abnormal  . Wrist fracture    Childhood    Past Surgical History:  Procedure Laterality Date  . TRIGGER FINGER RELEASE Right     Social History:    reports that he has been smoking. He has been smoking about 0.75 packs per day. He has never used smokeless tobacco. He reports current alcohol use. He reports that he does not use drugs.  Family History: Non-Contributory to the present illness  Allergies  Allergen Reactions  . Penicillins Rash    Medications: Reviewed on Rounds  Physical Exam:  Vitals: Temperature 98.7 pulse 70 respiratory 35 blood pressure is  143/68 saturations 99%  Ventilator Settings off the ventilator on T collar  . General: Comfortable at this time . Eyes: Grossly normal lids, irises & conjunctiva . ENT: grossly tongue is normal . Neck: no obvious mass . Cardiovascular: S1-S2 normal no gallop or rub . Respiratory: No rhonchi no rales are noted at this time . Abdomen: Soft and nontender . Skin: no rash seen on limited exam . Musculoskeletal: not rigid . Psychiatric:unable to assess . Neurologic: no seizure no involuntary movements         Labs on Admission:  Basic Metabolic Panel: Recent Labs  Lab 07/11/20 0239 07/11/20 0239 07/12/20 0033 07/12/20 0033 07/13/20 0207 07/14/20 0111 07/15/20 0134 07/16/20 0103 07/17/20 0503  NA 139   < > 140   < > 141 142 142 146* 151*  K 3.8   < > 4.2   < > 3.8 3.8 4.4 3.7 4.9  CL 105   < > 106   < > 106 109 108 112* 118*  CO2 23   < > 23   < > 21* 22 18* 21* 22  GLUCOSE 136*   < > 165*   < > 169* 161* 207* 167* 134*  BUN 42*   < > 51*   < > 64* 75* 92* 95* 85*  CREATININE 1.28*   < > 1.55*   < > 1.78* 2.14* 2.19* 1.93* 1.68*  CALCIUM 9.1   < >  9.1   < > 9.0 9.1 9.3 9.2 9.4  MG 2.1  --  2.1  --   --  2.4 2.5* 2.4  --   PHOS 3.6  --  4.2  --   --  4.5 5.5* 4.0  --    < > = values in this interval not displayed.    No results for input(s): PHART, PCO2ART, PO2ART, HCO3, O2SAT in the last 168 hours.  Liver Function Tests: Recent Labs  Lab 07/12/20 0033  AST 69*  ALT 18  ALKPHOS 118  BILITOT 0.5  PROT 6.2*  ALBUMIN 2.1*   No results for input(s): LIPASE, AMYLASE in the last 168 hours. Recent Labs  Lab 07/13/20 1201 07/17/20 0503  AMMONIA 44* 33    CBC: Recent Labs  Lab 07/13/20 0207 07/14/20 0111 07/15/20 0134 07/16/20 0103 07/17/20 0503  WBC 22.4* 21.8* 21.8* 16.9* 16.2*  NEUTROABS  --   --   --   --  13.0*  HGB 8.8* 8.2* 8.0* 8.8* 9.4*  HCT 27.5* 26.1* 25.3* 28.7* 30.2*  MCV 93.9 95.3 94.8 94.4 95.6  PLT 441* 419* 478* 524* 569*    Cardiac  Enzymes: No results for input(s): CKTOTAL, CKMB, CKMBINDEX, TROPONINI in the last 168 hours.  BNP (last 3 results) Recent Labs    06/29/20 0009  BNP 412.0*    ProBNP (last 3 results) No results for input(s): PROBNP in the last 8760 hours.   Radiological Exams on Admission: DG Abd 1 View  Result Date: 07/16/2020 CLINICAL DATA:  G-tube placement EXAM: ABDOMEN - 1 VIEW COMPARISON:  None. FINDINGS: Injection of contrast through the gastrostomy tube opacifies the stomach. Oral contrast is noted within the proximal small bowel/duodenum. There is no definite extraluminal contrast or free air. IMPRESSION: Gastrostomy tube in the stomach. Electronically Signed   By: Constance Holster M.D.   On: 07/16/2020 16:27   DG Chest Port 1 View  Result Date: 07/16/2020 CLINICAL DATA:  Pneumonia EXAM: PORTABLE CHEST 1 VIEW COMPARISON:  07/15/2020 FINDINGS: Tracheostomy in good position. Right lower lobe airspace disease unchanged. Slight left lower lobe airspace disease unchanged Negative for edema or effusion. IMPRESSION: Bibasilar airspace disease right greater than left unchanged. No new findings. Electronically Signed   By: Franchot Gallo M.D.   On: 07/16/2020 16:27   DG CHEST PORT 1 VIEW  Result Date: 07/15/2020 CLINICAL DATA:  Respiratory failure EXAM: PORTABLE CHEST 1 VIEW COMPARISON:  07/13/2020 FINDINGS: Tracheostomy tube is again noted and stable. Feeding catheter has been removed in the interval. The lungs are well aerated bilaterally. Mild right basilar atelectasis is seen. Central hilar prominence is again noted likely related to vascularity. No new focal infiltrate is seen. No pneumothorax is noted. IMPRESSION: Mild right basilar atelectasis. Electronically Signed   By: Inez Catalina M.D.   On: 07/15/2020 08:10   ECHOCARDIOGRAM LIMITED  Result Date: 07/15/2020    ECHOCARDIOGRAM LIMITED REPORT   Patient Name:   Christopher [name] Date of Exam: 07/15/2020 Medical Rec #:  [id number]        Height:        68.0 in Accession #:    [id number]       Weight:       179.0 lb Date of Birth:  12/24/1945         BSA:          1.950 m Patient Age:    76 years         BP:  135/62 mmHg Patient Gender: M                HR:           70 bpm. Exam Location:  Inpatient Procedure: Limited Echo, Cardiac Doppler and Color Doppler Indications:    Cardiomyopathy  History:        Patient has prior history of Echocardiogram examinations, most                 recent 06/30/2020. Arrythmias:Cardiac Arrest and Ventricular                 Fibrillation; Risk Factors:Hypertension. ETOH abuse, renal                 failure.  Sonographer:    Dustin Flock Referring Phys: [id number] PETER M [place]  Sonographer Comments: Echo performed with patient supine and on artificial respirator. IMPRESSIONS  1. Left ventricular ejection fraction, by estimation, is 60 to 65%. The left ventricle has normal function. The left ventricle has no regional wall motion abnormalities. There is moderate concentric left ventricular hypertrophy. Left ventricular diastolic parameters are consistent with Grade I diastolic dysfunction (impaired relaxation).  2. Right ventricular systolic function is normal. The right ventricular size is normal. Tricuspid regurgitation signal is inadequate for assessing PA pressure.  3. The mitral valve is normal in structure. No evidence of mitral valve regurgitation. No evidence of mitral stenosis.  4. The aortic valve is normal in structure. Aortic valve regurgitation is not visualized. No aortic stenosis is present. Comparison(s): Prior images reviewed side by side. The left ventricular function has improved. The right ventricular systolic function has improved. FINDINGS  Left Ventricle: Left ventricular ejection fraction, by estimation, is 60 to 65%. The left ventricle has normal function. The left ventricle has no regional wall motion abnormalities. The left ventricular internal cavity size was normal in size. There is  moderate  concentric left ventricular hypertrophy. Indeterminate filling pressures. Right Ventricle: The right ventricular size is normal. No increase in right ventricular wall thickness. Right ventricular systolic function is normal. Tricuspid regurgitation signal is inadequate for assessing PA pressure. Left Atrium: Left atrial size was normal in size. Right Atrium: Right atrial size was normal in size. Pericardium: There is no evidence of pericardial effusion. Mitral Valve: The mitral valve is normal in structure. Normal mobility of the mitral valve leaflets. No evidence of mitral valve stenosis. Tricuspid Valve: The tricuspid valve is normal in structure. Tricuspid valve regurgitation is not demonstrated. No evidence of tricuspid stenosis. Aortic Valve: The aortic valve is normal in structure. Aortic valve regurgitation is not visualized. No aortic stenosis is present. Pulmonic Valve: The pulmonic valve was normal in structure. Pulmonic valve regurgitation is not visualized. No evidence of pulmonic stenosis. Aorta: The aortic root is normal in size and structure. Venous: IVC assessment for right atrial pressure unable to be performed due to mechanical ventilation. IAS/Shunts: No atrial level shunt detected by color flow Doppler. LEFT VENTRICLE PLAX 2D LVIDd:         5.00 cm Diastology LVIDs:         3.50 cm LV e' lateral:   7.62 cm/s LV PW:         1.50 cm LV E/e' lateral: 10.3 LV IVS:        1.50 cm LV e' medial:    6.42 cm/s  LV E/e' medial:  12.3  AORTIC VALVE LVOT Vmax:   123.00 cm/s LVOT Vmean:  79.000 cm/s LVOT VTI:    0.250 m MITRAL VALVE MV Area (PHT): 2.80 cm    SHUNTS MV Decel Time: 271 msec    Systemic VTI: 0.25 m MV E velocity: 78.80 cm/s MV A velocity: 78.00 cm/s MV E/A ratio:  1.01 Mihai Croitoru MD Electronically signed by Sanda Klein MD Signature Date/Time: 07/15/2020/11:19:26 AM    Final     Assessment/Plan Active Problems:   Acute on chronic respiratory failure with hypoxia  (HCC)   Chronic anoxic encephalopathy (HCC)   Chronic atrial fibrillation (HCC)   Acute renal failure due to tubular necrosis ([place])   Cardiac arrest ([place])   1. Acute on chronic respiratory failure with hypoxia at this time patient is going to be continued on T collar right now is on 28% FiO2.  Main issue right now remains the fact that the patient is encephalopathic not showing much improvement not sure if he will be able to control his airway. 2. Encephalopathy as above not much improvement has been noted so far.  Will discuss with primary care team regarding brain protocol 3. Chronic atrial fibrillation which has been episodic mostly now is rate controlled we will continue to monitor closely. 4. Acute renal failure clinically has been improving we will monitor the patient's full labs 5. Cardiac arrest currently rhythm is stable we will continue with supportive care  I have personally seen and evaluated the patient, evaluated laboratory and imaging results, formulated the assessment and plan and placed orders. The Patient requires high complexity decision making with multiple systems involvement.  Case was discussed on Rounds with the Respiratory Therapy Director and the Respiratory staff Time Spent 69minutes  Ansley Stanwood A Sashia Campas, MD [facility] Pulmonary Critical Care Medicine Sleep Medicine

## 2020-07-18 ENCOUNTER — Encounter (HOSPITAL_COMMUNITY): Payer: Self-pay | Admitting: Surgery

## 2020-07-18 LAB — BLOOD GAS, ARTERIAL
Acid-base deficit: 2 mmol/L (ref 0.0–2.0)
Bicarbonate: 22.1 mmol/L (ref 20.0–28.0)
Drawn by: 243969
FIO2: 28
O2 Saturation: 94.2 %
Patient temperature: 37.3
pCO2 arterial: 37.3 mmHg (ref 32.0–48.0)
pH, Arterial: 7.392 (ref 7.350–7.450)
pO2, Arterial: 74.1 mmHg — ABNORMAL LOW (ref 83.0–108.0)

## 2020-07-18 LAB — BASIC METABOLIC PANEL
Anion gap: 9 (ref 5–15)
BUN: 78 mg/dL — ABNORMAL HIGH (ref 8–23)
CO2: 22 mmol/L (ref 22–32)
Calcium: 9.3 mg/dL (ref 8.9–10.3)
Chloride: 118 mmol/L — ABNORMAL HIGH (ref 98–111)
Creatinine, Ser: 1.57 mg/dL — ABNORMAL HIGH (ref 0.61–1.24)
GFR calc Af Amer: 50 mL/min — ABNORMAL LOW (ref >60)
GFR calc non Af Amer: 43 mL/min — ABNORMAL LOW (ref >60)
Glucose, Bld: 176 mg/dL — ABNORMAL HIGH (ref 70–99)
Potassium: 4.5 mmol/L (ref 3.5–5.1)
Sodium: 149 mmol/L — ABNORMAL HIGH (ref 135–145)

## 2020-07-18 LAB — CBC
HCT: 31.1 % — ABNORMAL LOW (ref 39.0–52.0)
Hemoglobin: 9.4 g/dL — ABNORMAL LOW (ref 13.0–17.0)
MCH: 28.9 pg (ref 26.0–34.0)
MCHC: 30.2 g/dL (ref 30.0–36.0)
MCV: 95.7 fL (ref 80.0–100.0)
Platelets: 547 10*3/uL — ABNORMAL HIGH (ref 150–400)
RBC: 3.25 MIL/uL — ABNORMAL LOW (ref 4.22–5.81)
RDW: 14.8 % (ref 11.5–15.5)
WBC: 16.1 10*3/uL — ABNORMAL HIGH (ref 4.0–10.5)
nRBC: 0 % (ref 0.0–0.2)

## 2020-07-18 NOTE — Progress Notes (Addendum)
Pulmonary Critical Care Medicine [place]   PULMONARY CRITICAL CARE SERVICE  PROGRESS NOTE  Date of Service: 07/18/2020  Christopher [name]  [id number]:[id number]  DOB: [birth year]   DOA: 07/16/2020  Referring Physician: Merton Border, MD  HPI: Christopher [name] is a 74 y.o. male seen for follow up of Acute on Chronic Respiratory Failure.  Patient mains on aerosol trach collar 28% FiO2 satting well no fever or distress.  Medications: Reviewed on Rounds  Physical Exam:  Vitals: Pulse 76 respirations 32 BP 131/93 O2 sat 97% temp 99.1  Ventilator Settings ATC 28%  . General: Comfortable at this time . Eyes: Grossly normal lids, irises & conjunctiva . ENT: grossly tongue is normal . Neck: no obvious mass . Cardiovascular: S1 S2 normal no gallop . Respiratory: No rales or rhonchi noted . Abdomen: soft . Skin: no rash seen on limited exam . Musculoskeletal: not rigid . Psychiatric:unable to assess . Neurologic: no seizure no involuntary movements         Lab Data:   Basic Metabolic Panel: Recent Labs  Lab 07/12/20 0033 07/13/20 0207 07/14/20 0111 07/15/20 0134 07/16/20 0103 07/17/20 0503 07/18/20 0502  NA 140   < > 142 142 146* 151* 149*  K 4.2   < > 3.8 4.4 3.7 4.9 4.5  CL 106   < > 109 108 112* 118* 118*  CO2 23   < > 22 18* 21* 22 22  GLUCOSE 165*   < > 161* 207* 167* 134* 176*  BUN 51*   < > 75* 92* 95* 85* 78*  CREATININE 1.55*   < > 2.14* 2.19* 1.93* 1.68* 1.57*  CALCIUM 9.1   < > 9.1 9.3 9.2 9.4 9.3  MG 2.1  --  2.4 2.5* 2.4  --   --   PHOS 4.2  --  4.5 5.5* 4.0  --   --    < > = values in this interval not displayed.    ABG: Recent Labs  Lab 07/18/20 0746  PHART 7.392  PCO2ART 37.3  PO2ART 74.1*  HCO3 22.1  O2SAT 94.2    Liver Function Tests: Recent Labs  Lab 07/12/20 0033  AST 69*  ALT 18  ALKPHOS 118  BILITOT 0.5  PROT 6.2*  ALBUMIN 2.1*   No results for input(s): LIPASE, AMYLASE in the last 168 hours. Recent Labs  Lab  07/13/20 1201 07/17/20 0503  AMMONIA 44* 33    CBC: Recent Labs  Lab 07/14/20 0111 07/15/20 0134 07/16/20 0103 07/17/20 0503 07/18/20 0502  WBC 21.8* 21.8* 16.9* 16.2* 16.1*  NEUTROABS  --   --   --  13.0*  --   HGB 8.2* 8.0* 8.8* 9.4* 9.4*  HCT 26.1* 25.3* 28.7* 30.2* 31.1*  MCV 95.3 94.8 94.4 95.6 95.7  PLT 419* 478* 524* 569* 547*    Cardiac Enzymes: No results for input(s): CKTOTAL, CKMB, CKMBINDEX, TROPONINI in the last 168 hours.  BNP (last 3 results) Recent Labs    06/29/20 0009  BNP 412.0*    ProBNP (last 3 results) No results for input(s): PROBNP in the last 8760 hours.  Radiological Exams: No results found.  Assessment/Plan Active Problems:   Acute on chronic respiratory failure with hypoxia (HCC)   Chronic anoxic encephalopathy (HCC)   Chronic atrial fibrillation (HCC)   Acute renal failure due to tubular necrosis [facility])   Cardiac arrest ([place])   1. Acute on chronic respiratory failure with hypoxia patient continue on aerosol trach collar  28% FiO2 continue aggressive pulmonary toilet and supportive measures at this time. 2. Encephalopathy not much improvement continue to follow 3. Chronic atrial fibrillation rate controlled continue to monitor 4. Acute renal failure clinically has been improving we will monitor the patient's full labs 5. Cardiac arrest currently rhythm is stable we will continue with supportive care   I have personally seen and evaluated the patient, evaluated laboratory and imaging results, formulated the assessment and plan and placed orders. The Patient requires high complexity decision making with multiple systems involvement.  Rounds were done with the Respiratory Therapy Director and Staff therapists and discussed with nursing staff also.  Allyne Gee, MD [facility] Pulmonary Critical Care Medicine Sleep Medicine

## 2020-07-19 LAB — CBC
HCT: 29.6 % — ABNORMAL LOW (ref 39.0–52.0)
Hemoglobin: 8.9 g/dL — ABNORMAL LOW (ref 13.0–17.0)
MCH: 29.1 pg (ref 26.0–34.0)
MCHC: 30.1 g/dL (ref 30.0–36.0)
MCV: 96.7 fL (ref 80.0–100.0)
Platelets: 537 10*3/uL — ABNORMAL HIGH (ref 150–400)
RBC: 3.06 MIL/uL — ABNORMAL LOW (ref 4.22–5.81)
RDW: 15.2 % (ref 11.5–15.5)
WBC: 16.1 10*3/uL — ABNORMAL HIGH (ref 4.0–10.5)
nRBC: 0 % (ref 0.0–0.2)

## 2020-07-19 LAB — RENAL FUNCTION PANEL
Albumin: 1.9 g/dL — ABNORMAL LOW (ref 3.5–5.0)
Anion gap: 11 (ref 5–15)
BUN: 79 mg/dL — ABNORMAL HIGH (ref 8–23)
CO2: 20 mmol/L — ABNORMAL LOW (ref 22–32)
Calcium: 9.6 mg/dL (ref 8.9–10.3)
Chloride: 123 mmol/L — ABNORMAL HIGH (ref 98–111)
Creatinine, Ser: 1.78 mg/dL — ABNORMAL HIGH (ref 0.61–1.24)
GFR calc Af Amer: 43 mL/min — ABNORMAL LOW (ref >60)
GFR calc non Af Amer: 37 mL/min — ABNORMAL LOW (ref >60)
Glucose, Bld: 168 mg/dL — ABNORMAL HIGH (ref 70–99)
Phosphorus: 4.2 mg/dL (ref 2.5–4.6)
Potassium: 4.6 mmol/L (ref 3.5–5.1)
Sodium: 154 mmol/L — ABNORMAL HIGH (ref 135–145)

## 2020-07-19 LAB — CULTURE, RESPIRATORY W GRAM STAIN: Culture: NORMAL

## 2020-07-19 LAB — MAGNESIUM: Magnesium: 2.3 mg/dL (ref 1.7–2.4)

## 2020-07-19 NOTE — Progress Notes (Signed)
Pulmonary Critical Care Medicine [place]   PULMONARY CRITICAL CARE SERVICE  PROGRESS NOTE  Date of Service: 07/19/2020  ELVEN LABOY  [id number]:[id number]  DOB: Feb 21, 1946   DOA: 07/16/2020  Referring Physician: Merton Border, MD  HPI: IVAR DOMANGUE is a 74 y.o. male seen for follow up of Acute on Chronic Respiratory Failure.  Patient currently is on T collar has been on 28% FiO2 appears to be doing fairly well  Medications: Reviewed on Rounds  Physical Exam:  Vitals: Temperature is 98.7 pulse 79 respiratory rate 36 blood pressure is 141/59 saturations 98%  Ventilator Settings on T collar FiO2 28%  . General: Comfortable at this time . Eyes: Grossly normal lids, irises & conjunctiva . ENT: grossly tongue is normal . Neck: no obvious mass . Cardiovascular: S1 S2 normal no gallop . Respiratory: No rhonchi very coarse breath sounds . Abdomen: soft . Skin: no rash seen on limited exam . Musculoskeletal: not rigid . Psychiatric:unable to assess . Neurologic: no seizure no involuntary movements         Lab Data:   Basic Metabolic Panel: Recent Labs  Lab 07/14/20 0111 07/14/20 0111 07/15/20 0134 07/16/20 0103 07/17/20 0503 07/18/20 0502 07/19/20 0548  NA 142   < > 142 146* 151* 149* 154*  K 3.8   < > 4.4 3.7 4.9 4.5 4.6  CL 109   < > 108 112* 118* 118* 123*  CO2 22   < > 18* 21* 22 22 20*  GLUCOSE 161*   < > 207* 167* 134* 176* 168*  BUN 75*   < > 92* 95* 85* 78* 79*  CREATININE 2.14*   < > 2.19* 1.93* 1.68* 1.57* 1.78*  CALCIUM 9.1   < > 9.3 9.2 9.4 9.3 9.6  MG 2.4  --  2.5* 2.4  --   --  2.3  PHOS 4.5  --  5.5* 4.0  --   --  4.2   < > = values in this interval not displayed.    ABG: Recent Labs  Lab 07/18/20 0746  PHART 7.392  PCO2ART 37.3  PO2ART 74.1*  HCO3 22.1  O2SAT 94.2    Liver Function Tests: Recent Labs  Lab 07/19/20 0548  ALBUMIN 1.9*   No results for input(s): LIPASE, AMYLASE in the last 168 hours. Recent Labs   Lab 07/13/20 1201 07/17/20 0503  AMMONIA 44* 33    CBC: Recent Labs  Lab 07/15/20 0134 07/16/20 0103 07/17/20 0503 07/18/20 0502 07/19/20 0548  WBC 21.8* 16.9* 16.2* 16.1* 16.1*  NEUTROABS  --   --  13.0*  --   --   HGB 8.0* 8.8* 9.4* 9.4* 8.9*  HCT 25.3* 28.7* 30.2* 31.1* 29.6*  MCV 94.8 94.4 95.6 95.7 96.7  PLT 478* 524* 569* 547* 537*    Cardiac Enzymes: No results for input(s): CKTOTAL, CKMB, CKMBINDEX, TROPONINI in the last 168 hours.  BNP (last 3 results) Recent Labs    06/29/20 0009  BNP 412.0*    ProBNP (last 3 results) No results for input(s): PROBNP in the last 8760 hours.  Radiological Exams: No results found.  Assessment/Plan Active Problems:   Acute on chronic respiratory failure with hypoxia (HCC)   Chronic anoxic encephalopathy (HCC)   Chronic atrial fibrillation (HCC)   Acute renal failure due to tubular necrosis [facility])   Cardiac arrest ([place])   1. Acute on chronic respiratory failure with hypoxia plan is to continue with T collar trials titrate oxygen continue  pulmonary toilet. 2. Chronic anoxic encephalopathy grossly unchanged 3. Chronic atrial fibrillation rate controlled 4. Acute renal failure following up on labs 5. Cardiac arrest rhythm has been stable.   I have personally seen and evaluated the patient, evaluated laboratory and imaging results, formulated the assessment and plan and placed orders. The Patient requires high complexity decision making with multiple systems involvement.  Rounds were done with the Respiratory Therapy Director and Staff therapists and discussed with nursing staff also.  Allyne Gee, MD [facility] Pulmonary Critical Care Medicine Sleep Medicine

## 2020-07-20 LAB — RENAL FUNCTION PANEL
Albumin: 2.1 g/dL — ABNORMAL LOW (ref 3.5–5.0)
Anion gap: 11 (ref 5–15)
BUN: 64 mg/dL — ABNORMAL HIGH (ref 8–23)
CO2: 21 mmol/L — ABNORMAL LOW (ref 22–32)
Calcium: 9.5 mg/dL (ref 8.9–10.3)
Chloride: 119 mmol/L — ABNORMAL HIGH (ref 98–111)
Creatinine, Ser: 1.45 mg/dL — ABNORMAL HIGH (ref 0.61–1.24)
GFR calc Af Amer: 55 mL/min — ABNORMAL LOW (ref >60)
GFR calc non Af Amer: 47 mL/min — ABNORMAL LOW (ref >60)
Glucose, Bld: 145 mg/dL — ABNORMAL HIGH (ref 70–99)
Phosphorus: 4.6 mg/dL (ref 2.5–4.6)
Potassium: 4.3 mmol/L (ref 3.5–5.1)
Sodium: 151 mmol/L — ABNORMAL HIGH (ref 135–145)

## 2020-07-20 LAB — MAGNESIUM: Magnesium: 2 mg/dL (ref 1.7–2.4)

## 2020-07-20 NOTE — Progress Notes (Signed)
Pulmonary Critical Care Medicine [place]   PULMONARY CRITICAL CARE SERVICE  PROGRESS NOTE  Date of Service: 07/20/2020  DAILY CRATE  KDX:[id number]  DOB: 1946/10/07   DOA: 07/16/2020  Referring Physician: Merton Border, MD  HPI: Christopher [name] is a 74 y.o. male seen for follow up of Acute on Chronic Respiratory Failure.  Patient currently is on T collar has been on 28% FiO2 with good saturations  Medications: Reviewed on Rounds  Physical Exam:  Vitals: Temperature is 100.1 pulse 73 respiratory 30 blood pressure is 146/72 saturations 98%  Ventilator Settings on T collar FiO2 28%   General: Comfortable at this time  Eyes: Grossly normal lids, irises & conjunctiva  ENT: grossly tongue is normal  Neck: no obvious mass  Cardiovascular: S1 S2 normal no gallop  Respiratory: No rhonchi coarse breath sounds  Abdomen: soft  Skin: no rash seen on limited exam  Musculoskeletal: not rigid  Psychiatric:unable to assess  Neurologic: no seizure no involuntary movements         Lab Data:   Basic Metabolic Panel: Recent Labs  Lab 07/14/20 0111 07/14/20 0111 07/15/20 0134 07/16/20 0103 07/17/20 0503 07/18/20 0502 07/19/20 0548  NA 142   < > 142 146* 151* 149* 154*  K 3.8   < > 4.4 3.7 4.9 4.5 4.6  CL 109   < > 108 112* 118* 118* 123*  CO2 22   < > 18* 21* 22 22 20*  GLUCOSE 161*   < > 207* 167* 134* 176* 168*  BUN 75*   < > 92* 95* 85* 78* 79*  CREATININE 2.14*   < > 2.19* 1.93* 1.68* 1.57* 1.78*  CALCIUM 9.1   < > 9.3 9.2 9.4 9.3 9.6  MG 2.4  --  2.5* 2.4  --   --  2.3  PHOS 4.5  --  5.5* 4.0  --   --  4.2   < > = values in this interval not displayed.    ABG: Recent Labs  Lab 07/18/20 0746  PHART 7.392  PCO2ART 37.3  PO2ART 74.1*  HCO3 22.1  O2SAT 94.2    Liver Function Tests: Recent Labs  Lab 07/19/20 0548  ALBUMIN 1.9*   No results for input(s): LIPASE, AMYLASE in the last 168 hours. Recent Labs  Lab 07/13/20 1201  07/17/20 0503  AMMONIA 44* 33    CBC: Recent Labs  Lab 07/15/20 0134 07/16/20 0103 07/17/20 0503 07/18/20 0502 07/19/20 0548  WBC 21.8* 16.9* 16.2* 16.1* 16.1*  NEUTROABS  --   --  13.0*  --   --   HGB 8.0* 8.8* 9.4* 9.4* 8.9*  HCT 25.3* 28.7* 30.2* 31.1* 29.6*  MCV 94.8 94.4 95.6 95.7 96.7  PLT 478* 524* 569* 547* 537*    Cardiac Enzymes: No results for input(s): CKTOTAL, CKMB, CKMBINDEX, TROPONINI in the last 168 hours.  BNP (last 3 results) Recent Labs    06/29/20 0009  BNP 412.0*    ProBNP (last 3 results) No results for input(s): PROBNP in the last 8760 hours.  Radiological Exams: No results found.  Assessment/Plan Active Problems:   Acute on chronic respiratory failure with hypoxia (HCC)   Chronic anoxic encephalopathy (HCC)   Chronic atrial fibrillation (HCC)   Acute renal failure due to tubular necrosis [facility])   Cardiac arrest ([place])   1. Acute on chronic respiratory failure with hypoxia we will continue with T collar trials currently on 20% FiO2 continue pulmonary toilet supportive care.  2. Chronic encephalopathy no change 3. Chronic atrial fibrillation rate controlled 4. Acute renal failure following up on labs 5. Cardiac arrest rhythm has been stable   I have personally seen and evaluated the patient, evaluated laboratory and imaging results, formulated the assessment and plan and placed orders. The Patient requires high complexity decision making with multiple systems involvement.  Rounds were done with the Respiratory Therapy Director and Staff therapists and discussed with nursing staff also.  Allyne Gee, MD [facility] Pulmonary Critical Care Medicine Sleep Medicine

## 2020-07-21 ENCOUNTER — Other Ambulatory Visit (HOSPITAL_COMMUNITY): Payer: Self-pay

## 2020-07-21 LAB — CBC
HCT: 30.8 % — ABNORMAL LOW (ref 39.0–52.0)
Hemoglobin: 9.3 g/dL — ABNORMAL LOW (ref 13.0–17.0)
MCH: 29.2 pg (ref 26.0–34.0)
MCHC: 30.2 g/dL (ref 30.0–36.0)
MCV: 96.9 fL (ref 80.0–100.0)
Platelets: 447 10*3/uL — ABNORMAL HIGH (ref 150–400)
RBC: 3.18 MIL/uL — ABNORMAL LOW (ref 4.22–5.81)
RDW: 14.6 % (ref 11.5–15.5)
WBC: 11.5 10*3/uL — ABNORMAL HIGH (ref 4.0–10.5)
nRBC: 0 % (ref 0.0–0.2)

## 2020-07-21 LAB — RENAL FUNCTION PANEL
Albumin: 1.8 g/dL — ABNORMAL LOW (ref 3.5–5.0)
Anion gap: 11 (ref 5–15)
BUN: 57 mg/dL — ABNORMAL HIGH (ref 8–23)
CO2: 20 mmol/L — ABNORMAL LOW (ref 22–32)
Calcium: 9.2 mg/dL (ref 8.9–10.3)
Chloride: 113 mmol/L — ABNORMAL HIGH (ref 98–111)
Creatinine, Ser: 1.31 mg/dL — ABNORMAL HIGH (ref 0.61–1.24)
GFR calc Af Amer: >60 mL/min (ref >60)
GFR calc non Af Amer: 53 mL/min — ABNORMAL LOW (ref >60)
Glucose, Bld: 166 mg/dL — ABNORMAL HIGH (ref 70–99)
Phosphorus: 4.2 mg/dL (ref 2.5–4.6)
Potassium: 3.9 mmol/L (ref 3.5–5.1)
Sodium: 144 mmol/L (ref 135–145)

## 2020-07-21 LAB — MAGNESIUM: Magnesium: 1.9 mg/dL (ref 1.7–2.4)

## 2020-07-21 NOTE — Progress Notes (Signed)
Pulmonary Critical Care Medicine [place]   PULMONARY CRITICAL CARE SERVICE  PROGRESS NOTE  Date of Service: 07/21/2020  Christopher [name]  ENI:[id number]  DOB: 10/07/1946   DOA: 07/16/2020  Referring Physician: Merton Border, MD  HPI: Christopher [name] is a 74 y.o. male seen for follow up of Acute on Chronic Respiratory Failure.  Patient currently is on T collar has been on 28% FiO2 with a tracheostomy in place  Medications: Reviewed on Rounds  Physical Exam:  Vitals: Temperature is 99.2 pulse 59 respiratory rate 24 blood pressure is 123/56  Ventilator Settings off the ventilator on T collar  . General: Comfortable at this time . Eyes: Grossly normal lids, irises & conjunctiva . ENT: grossly tongue is normal . Neck: no obvious mass . Cardiovascular: S1 S2 normal no gallop . Respiratory: No rhonchi no rales are noted at this time . Abdomen: soft . Skin: no rash seen on limited exam . Musculoskeletal: not rigid . Psychiatric:unable to assess . Neurologic: no seizure no involuntary movements         Lab Data:   Basic Metabolic Panel: Recent Labs  Lab 07/15/20 0134 07/15/20 0134 07/16/20 0103 07/16/20 0103 07/17/20 0503 07/18/20 0502 07/19/20 0548 07/20/20 1103 07/21/20 0717  NA 142   < > 146*   < > 151* 149* 154* 151* 144  K 4.4   < > 3.7   < > 4.9 4.5 4.6 4.3 3.9  CL 108   < > 112*   < > 118* 118* 123* 119* 113*  CO2 18*   < > 21*   < > 22 22 20* 21* 20*  GLUCOSE 207*   < > 167*   < > 134* 176* 168* 145* 166*  BUN 92*   < > 95*   < > 85* 78* 79* 64* 57*  CREATININE 2.19*   < > 1.93*   < > 1.68* 1.57* 1.78* 1.45* 1.31*  CALCIUM 9.3   < > 9.2   < > 9.4 9.3 9.6 9.5 9.2  MG 2.5*  --  2.4  --   --   --  2.3 2.0 1.9  PHOS 5.5*  --  4.0  --   --   --  4.2 4.6 4.2   < > = values in this interval not displayed.    ABG: Recent Labs  Lab 07/18/20 0746  PHART 7.392  PCO2ART 37.3  PO2ART 74.1*  HCO3 22.1  O2SAT 94.2    Liver Function  Tests: Recent Labs  Lab 07/19/20 0548 07/20/20 1103 07/21/20 0717  ALBUMIN 1.9* 2.1* 1.8*   No results for input(s): LIPASE, AMYLASE in the last 168 hours. Recent Labs  Lab 07/17/20 0503  AMMONIA 33    CBC: Recent Labs  Lab 07/16/20 0103 07/17/20 0503 07/18/20 0502 07/19/20 0548 07/21/20 0717  WBC 16.9* 16.2* 16.1* 16.1* 11.5*  NEUTROABS  --  13.0*  --   --   --   HGB 8.8* 9.4* 9.4* 8.9* 9.3*  HCT 28.7* 30.2* 31.1* 29.6* 30.8*  MCV 94.4 95.6 95.7 96.7 96.9  PLT 524* 569* 547* 537* 447*    Cardiac Enzymes: No results for input(s): CKTOTAL, CKMB, CKMBINDEX, TROPONINI in the last 168 hours.  BNP (last 3 results) Recent Labs    06/29/20 0009  BNP 412.0*    ProBNP (last 3 results) No results for input(s): PROBNP in the last 8760 hours.  Radiological Exams: DG CHEST PORT 1 VIEW  Result Date: 07/21/2020  CLINICAL DATA:  Pneumonia EXAM: PORTABLE CHEST 1 VIEW COMPARISON:  07/16/2020 FINDINGS: Tracheostomy tube unchanged. Normal cardiac silhouette. Band of airspace disease in the RIGHT lower lobe is similar to comparison exam. LEFT lung relatively clear. IMPRESSION: 1. No significant change. 2. Persistent RIGHT lower lobe airspace disease. Electronically Signed   By: Suzy Bouchard M.D.   On: 07/21/2020 07:14    Assessment/Plan Active Problems:   Acute on chronic respiratory failure with hypoxia (HCC)   Chronic anoxic encephalopathy (HCC)   Chronic atrial fibrillation (HCC)   Acute renal failure due to tubular necrosis [facility])   Cardiac arrest ([place])   1. Acute on chronic respiratory failure hypoxia continue with T collar trials titrate oxygen as tolerated.  Today the tracheostomy will be changed out to a cuffless trach 2. Chronic anoxic encephalopathy no sign of improvement 3. Chronic atrial fibrillation rate controlled 4. Acute renal failure no change we will continue to monitor 5. Cardiac arrest regular rhythm has been stable we will monitor closely   I have  personally seen and evaluated the patient, evaluated laboratory and imaging results, formulated the assessment and plan and placed orders. The Patient requires high complexity decision making with multiple systems involvement.  Rounds were done with the Respiratory Therapy Director and Staff therapists and discussed with nursing staff also.  Allyne Gee, MD [facility] Pulmonary Critical Care Medicine Sleep Medicine

## 2020-07-22 NOTE — Progress Notes (Addendum)
Pulmonary Critical Care Medicine [place]   PULMONARY CRITICAL CARE SERVICE  PROGRESS NOTE  Date of Service: 07/22/2020  XAINE SANSOM  QIW:[id number]  DOB: 10/29/1946   DOA: 07/16/2020  Referring Physician: Merton Border, MD  HPI: Christopher [name] is a 74 y.o. male seen for follow up of Acute on Chronic Respiratory Failure.  Patient continues on T-bar at this time currently on 20% FiO2 has been weaned down to 21% still has a moderate large amount of secretions with no distress at this time.  Medications: Reviewed on Rounds  Physical Exam:  Vitals: Pulse 58 respirations 27 BP 118/82 O2 sat 97% temp 97 1  Ventilator Settings T-bar 28%  . General: Comfortable at this time . Eyes: Grossly normal lids, irises & conjunctiva . ENT: grossly tongue is normal . Neck: no obvious mass . Cardiovascular: S1 S2 normal no gallop . Respiratory: No rales or rhonchi noted . Abdomen: soft . Skin: no rash seen on limited exam . Musculoskeletal: not rigid . Psychiatric:unable to assess . Neurologic: no seizure no involuntary movements         Lab Data:   Basic Metabolic Panel: Recent Labs  Lab 07/16/20 0103 07/16/20 0103 07/17/20 0503 07/18/20 0502 07/19/20 0548 07/20/20 1103 07/21/20 0717  NA 146*   < > 151* 149* 154* 151* 144  K 3.7   < > 4.9 4.5 4.6 4.3 3.9  CL 112*   < > 118* 118* 123* 119* 113*  CO2 21*   < > 22 22 20* 21* 20*  GLUCOSE 167*   < > 134* 176* 168* 145* 166*  BUN 95*   < > 85* 78* 79* 64* 57*  CREATININE 1.93*   < > 1.68* 1.57* 1.78* 1.45* 1.31*  CALCIUM 9.2   < > 9.4 9.3 9.6 9.5 9.2  MG 2.4  --   --   --  2.3 2.0 1.9  PHOS 4.0  --   --   --  4.2 4.6 4.2   < > = values in this interval not displayed.    ABG: Recent Labs  Lab 07/18/20 0746  PHART 7.392  PCO2ART 37.3  PO2ART 74.1*  HCO3 22.1  O2SAT 94.2    Liver Function Tests: Recent Labs  Lab 07/19/20 0548 07/20/20 1103 07/21/20 0717  ALBUMIN 1.9* 2.1* 1.8*   No results  for input(s): LIPASE, AMYLASE in the last 168 hours. Recent Labs  Lab 07/17/20 0503  AMMONIA 33    CBC: Recent Labs  Lab 07/16/20 0103 07/17/20 0503 07/18/20 0502 07/19/20 0548 07/21/20 0717  WBC 16.9* 16.2* 16.1* 16.1* 11.5*  NEUTROABS  --  13.0*  --   --   --   HGB 8.8* 9.4* 9.4* 8.9* 9.3*  HCT 28.7* 30.2* 31.1* 29.6* 30.8*  MCV 94.4 95.6 95.7 96.7 96.9  PLT 524* 569* 547* 537* 447*    Cardiac Enzymes: No results for input(s): CKTOTAL, CKMB, CKMBINDEX, TROPONINI in the last 168 hours.  BNP (last 3 results) Recent Labs    06/29/20 0009  BNP 412.0*    ProBNP (last 3 results) No results for input(s): PROBNP in the last 8760 hours.  Radiological Exams: DG CHEST PORT 1 VIEW  Result Date: 07/21/2020 CLINICAL DATA:  Pneumonia EXAM: PORTABLE CHEST 1 VIEW COMPARISON:  07/16/2020 FINDINGS: Tracheostomy tube unchanged. Normal cardiac silhouette. Band of airspace disease in the RIGHT lower lobe is similar to comparison exam. LEFT lung relatively clear. IMPRESSION: 1. No significant change. 2. Persistent RIGHT  lower lobe airspace disease. Electronically Signed   By: Suzy Bouchard M.D.   On: 07/21/2020 07:14    Assessment/Plan Active Problems:   Acute on chronic respiratory failure with hypoxia (HCC)   Chronic anoxic encephalopathy (HCC)   Chronic atrial fibrillation (HCC)   Acute renal failure due to tubular necrosis [facility])   Cardiac arrest ([place])   1. Acute on chronic respiratory failure hypoxia patient remains on T-bar 20% this time has been weaning down to room air doing well.  Has moderate to large amount of secretions.  We will continue aggressive pulmonary toilet supportive measures secretion management. 2. Chronic anoxic encephalopathy no sign of improvement 3. Chronic atrial fibrillation rate controlled 4. Acute renal failure no change we will continue to monitor 5. Cardiac arrest regular rhythm has been stable we will monitor closely   I have personally seen  and evaluated the patient, evaluated laboratory and imaging results, formulated the assessment and plan and placed orders. The Patient requires high complexity decision making with multiple systems involvement.  Rounds were done with the Respiratory Therapy Director and Staff therapists and discussed with nursing staff also.  Allyne Gee, MD [facility] Pulmonary Critical Care Medicine Sleep Medicine

## 2020-07-23 LAB — RENAL FUNCTION PANEL
Albumin: 2.1 g/dL — ABNORMAL LOW (ref 3.5–5.0)
Anion gap: 11 (ref 5–15)
BUN: 57 mg/dL — ABNORMAL HIGH (ref 8–23)
CO2: 19 mmol/L — ABNORMAL LOW (ref 22–32)
Calcium: 9.5 mg/dL (ref 8.9–10.3)
Chloride: 113 mmol/L — ABNORMAL HIGH (ref 98–111)
Creatinine, Ser: 1.3 mg/dL — ABNORMAL HIGH (ref 0.61–1.24)
GFR calc Af Amer: >60 mL/min (ref >60)
GFR calc non Af Amer: 54 mL/min — ABNORMAL LOW (ref >60)
Glucose, Bld: 127 mg/dL — ABNORMAL HIGH (ref 70–99)
Phosphorus: 4.4 mg/dL (ref 2.5–4.6)
Potassium: 3.8 mmol/L (ref 3.5–5.1)
Sodium: 143 mmol/L (ref 135–145)

## 2020-07-23 LAB — CBC
HCT: 33.9 % — ABNORMAL LOW (ref 39.0–52.0)
Hemoglobin: 10.2 g/dL — ABNORMAL LOW (ref 13.0–17.0)
MCH: 28.7 pg (ref 26.0–34.0)
MCHC: 30.1 g/dL (ref 30.0–36.0)
MCV: 95.5 fL (ref 80.0–100.0)
Platelets: 396 10*3/uL (ref 150–400)
RBC: 3.55 MIL/uL — ABNORMAL LOW (ref 4.22–5.81)
RDW: 14.1 % (ref 11.5–15.5)
WBC: 12.6 10*3/uL — ABNORMAL HIGH (ref 4.0–10.5)
nRBC: 0 % (ref 0.0–0.2)

## 2020-07-23 LAB — MAGNESIUM: Magnesium: 2 mg/dL (ref 1.7–2.4)

## 2020-07-23 LAB — AMMONIA: Ammonia: 41 umol/L — ABNORMAL HIGH (ref 9–35)

## 2020-07-23 NOTE — Progress Notes (Addendum)
Pulmonary Critical Care Medicine [place]   PULMONARY CRITICAL CARE SERVICE  PROGRESS NOTE  Date of Service: 07/23/2020  Christopher [name]  GYI:[id number]  DOB: [birth year]   DOA: 07/16/2020  Referring Physician: Merton Border, MD  HPI: Christopher [name] is a 74 y.o. male seen for follow up of Acute on Chronic Respiratory Failure.  Patient remains on 21% T-bar satting well no fever or distress does have a large amount of secretions noted.  Medications: Reviewed on Rounds  Physical Exam:  Vitals: Pulse 68 respirations 30 BP 134/64 O2 sat 90% temp 97.9  Ventilator Settings 21% T-bar  . General: Comfortable at this time . Eyes: Grossly normal lids, irises & conjunctiva . ENT: grossly tongue is normal . Neck: no obvious mass . Cardiovascular: S1 S2 normal no gallop . Respiratory: Coarse breath sounds . Abdomen: soft . Skin: no rash seen on limited exam . Musculoskeletal: not rigid . Psychiatric:unable to assess . Neurologic: no seizure no involuntary movements         Lab Data:   Basic Metabolic Panel: Recent Labs  Lab 07/18/20 0502 07/19/20 0548 07/20/20 1103 07/21/20 0717 07/23/20 0939  NA 149* 154* 151* 144 143  K 4.5 4.6 4.3 3.9 3.8  CL 118* 123* 119* 113* 113*  CO2 22 20* 21* 20* 19*  GLUCOSE 176* 168* 145* 166* 127*  BUN 78* 79* 64* 57* 57*  CREATININE 1.57* 1.78* 1.45* 1.31* 1.30*  CALCIUM 9.3 9.6 9.5 9.2 9.5  MG  --  2.3 2.0 1.9 2.0  PHOS  --  4.2 4.6 4.2 4.4    ABG: Recent Labs  Lab 07/18/20 0746  PHART 7.392  PCO2ART 37.3  PO2ART 74.1*  HCO3 22.1  O2SAT 94.2    Liver Function Tests: Recent Labs  Lab 07/19/20 0548 07/20/20 1103 07/21/20 0717 07/23/20 0939  ALBUMIN 1.9* 2.1* 1.8* 2.1*   No results for input(s): LIPASE, AMYLASE in the last 168 hours. Recent Labs  Lab 07/17/20 0503 07/23/20 0939  AMMONIA 33 41*    CBC: Recent Labs  Lab 07/17/20 0503 07/18/20 0502 07/19/20 0548 07/21/20 0717 07/23/20 0939   WBC 16.2* 16.1* 16.1* 11.5* 12.6*  NEUTROABS 13.0*  --   --   --   --   HGB 9.4* 9.4* 8.9* 9.3* 10.2*  HCT 30.2* 31.1* 29.6* 30.8* 33.9*  MCV 95.6 95.7 96.7 96.9 95.5  PLT 569* 547* 537* 447* 396    Cardiac Enzymes: No results for input(s): CKTOTAL, CKMB, CKMBINDEX, TROPONINI in the last 168 hours.  BNP (last 3 results) Recent Labs    06/29/20 0009  BNP 412.0*    ProBNP (last 3 results) No results for input(s): PROBNP in the last 8760 hours.  Radiological Exams: No results found.  Assessment/Plan Active Problems:   Acute on chronic respiratory failure with hypoxia (HCC)   Chronic anoxic encephalopathy (HCC)   Chronic atrial fibrillation (HCC)   Acute renal failure due to tubular necrosis [facility])   Cardiac arrest ([place])   1. Acute on chronic respiratory failure hypoxia patient remains on T-bar 21% this time has been weaning down to room air doing well.  Has moderate to large amount of secretions.  We will continue aggressive pulmonary toilet supportive measures secretion management. 2. Chronic anoxic encephalopathy no sign of improvement 3. Chronic atrial fibrillation rate controlled 4. Acute renal failure no change we will continue to monitor 5. Cardiac arrest regular rhythm has been stable we will monitor closely   I have personally  seen and evaluated the patient, evaluated laboratory and imaging results, formulated the assessment and plan and placed orders. The Patient requires high complexity decision making with multiple systems involvement.  Rounds were done with the Respiratory Therapy Director and Staff therapists and discussed with nursing staff also.  Allyne Gee, MD [facility] [place] Pulmonary Critical Care Medicine Sleep Medicine

## 2020-07-24 NOTE — Progress Notes (Addendum)
Pulmonary Critical Care Medicine [place]   PULMONARY CRITICAL CARE SERVICE  PROGRESS NOTE  Date of Service: 07/24/2020  Christopher [name]  EPP:[id number]  DOB: [birth year]   DOA: 07/16/2020  Referring Physician: Merton Border, MD  HPI: Christopher [name] is a 74 y.o. male seen for follow up of Acute on Chronic Respiratory Failure.  Patient remains on T-bar 21% FiO2 satting well no fever or distress.  Medications: Reviewed on Rounds  Physical Exam:  Vitals: Pulse 70 respirations 32 BP 128/73 O2 sat 94% temp 98.7  Ventilator Settings T-bar 21% FiO2  . General: Comfortable at this time . Eyes: Grossly normal lids, irises & conjunctiva . ENT: grossly tongue is normal . Neck: no obvious mass . Cardiovascular: S1 S2 normal no gallop . Respiratory: No rales or rhonchi noted . Abdomen: soft . Skin: no rash seen on limited exam . Musculoskeletal: not rigid . Psychiatric:unable to assess . Neurologic: no seizure no involuntary movements         Lab Data:   Basic Metabolic Panel: Recent Labs  Lab 07/18/20 0502 07/19/20 0548 07/20/20 1103 07/21/20 0717 07/23/20 0939  NA 149* 154* 151* 144 143  K 4.5 4.6 4.3 3.9 3.8  CL 118* 123* 119* 113* 113*  CO2 22 20* 21* 20* 19*  GLUCOSE 176* 168* 145* 166* 127*  BUN 78* 79* 64* 57* 57*  CREATININE 1.57* 1.78* 1.45* 1.31* 1.30*  CALCIUM 9.3 9.6 9.5 9.2 9.5  MG  --  2.3 2.0 1.9 2.0  PHOS  --  4.2 4.6 4.2 4.4    ABG: Recent Labs  Lab 07/18/20 0746  PHART 7.392  PCO2ART 37.3  PO2ART 74.1*  HCO3 22.1  O2SAT 94.2    Liver Function Tests: Recent Labs  Lab 07/19/20 0548 07/20/20 1103 07/21/20 0717 07/23/20 0939  ALBUMIN 1.9* 2.1* 1.8* 2.1*   No results for input(s): LIPASE, AMYLASE in the last 168 hours. Recent Labs  Lab 07/23/20 0939  AMMONIA 41*    CBC: Recent Labs  Lab 07/18/20 0502 07/19/20 0548 07/21/20 0717 07/23/20 0939  WBC 16.1* 16.1* 11.5* 12.6*  HGB 9.4* 8.9* 9.3* 10.2*  HCT  31.1* 29.6* 30.8* 33.9*  MCV 95.7 96.7 96.9 95.5  PLT 547* 537* 447* 396    Cardiac Enzymes: No results for input(s): CKTOTAL, CKMB, CKMBINDEX, TROPONINI in the last 168 hours.  BNP (last 3 results) Recent Labs    06/29/20 0009  BNP 412.0*    ProBNP (last 3 results) No results for input(s): PROBNP in the last 8760 hours.  Radiological Exams: No results found.  Assessment/Plan Active Problems:   Acute on chronic respiratory failure with hypoxia (HCC)   Chronic anoxic encephalopathy (HCC)   Chronic atrial fibrillation (HCC)   Acute renal failure due to tubular necrosis [facility])   Cardiac arrest ([place])   1. Acute on chronic respiratory failure hypoxiapatient remains on T-bar 21% this time has been weaning down to room air doing well. Has moderate to large amount of secretions. We will continue aggressive pulmonary toilet supportive measures secretion management. 2. Chronic anoxic encephalopathy no sign of improvement 3. Chronic atrial fibrillation rate controlled 4. Acute renal failure no change we will continue to monitor 5. Cardiac arrest regular rhythm has been stable we will monitor closely   I have personally seen and evaluated the patient, evaluated laboratory and imaging results, formulated the assessment and plan and placed orders. The Patient requires high complexity decision making with multiple systems involvement.  Rounds were  done with the Respiratory Therapy Director and Staff therapists and discussed with nursing staff also.  Allyne Gee, MD [facility] Pulmonary Critical Care Medicine Sleep Medicine

## 2020-07-25 LAB — CBC
HCT: 33.8 % — ABNORMAL LOW (ref 39.0–52.0)
Hemoglobin: 10.5 g/dL — ABNORMAL LOW (ref 13.0–17.0)
MCH: 29.5 pg (ref 26.0–34.0)
MCHC: 31.1 g/dL (ref 30.0–36.0)
MCV: 94.9 fL (ref 80.0–100.0)
Platelets: 418 10*3/uL — ABNORMAL HIGH (ref 150–400)
RBC: 3.56 MIL/uL — ABNORMAL LOW (ref 4.22–5.81)
RDW: 14.2 % (ref 11.5–15.5)
WBC: 15 10*3/uL — ABNORMAL HIGH (ref 4.0–10.5)
nRBC: 0 % (ref 0.0–0.2)

## 2020-07-25 LAB — RENAL FUNCTION PANEL
Albumin: 2.1 g/dL — ABNORMAL LOW (ref 3.5–5.0)
Anion gap: 9 (ref 5–15)
BUN: 66 mg/dL — ABNORMAL HIGH (ref 8–23)
CO2: 19 mmol/L — ABNORMAL LOW (ref 22–32)
Calcium: 9.8 mg/dL (ref 8.9–10.3)
Chloride: 117 mmol/L — ABNORMAL HIGH (ref 98–111)
Creatinine, Ser: 1.23 mg/dL (ref 0.61–1.24)
GFR calc Af Amer: >60 mL/min (ref >60)
GFR calc non Af Amer: 57 mL/min — ABNORMAL LOW (ref >60)
Glucose, Bld: 153 mg/dL — ABNORMAL HIGH (ref 70–99)
Phosphorus: 3.8 mg/dL (ref 2.5–4.6)
Potassium: 3.6 mmol/L (ref 3.5–5.1)
Sodium: 145 mmol/L (ref 135–145)

## 2020-07-25 LAB — MAGNESIUM: Magnesium: 2 mg/dL (ref 1.7–2.4)

## 2020-07-25 NOTE — Progress Notes (Addendum)
Pulmonary Critical Care Medicine [place]   PULMONARY CRITICAL CARE SERVICE  PROGRESS NOTE  Date of Service: 07/25/2020  Christopher [name]  VZD:[id number]  DOB: [birth year]   DOA: 07/16/2020  Referring Physician: Merton Border, MD  HPI: Christopher [name] is a 74 y.o. male seen for follow up of Acute on Chronic Respiratory Failure.  Patient remains on 21% T-bar satting well has a moderate to large amount of secretions noted at this time.  No distress.    Medications: Reviewed on Rounds  Physical Exam:  Vitals: Pulse 76 respirations 25 BP 136/70 O2 sat 95% temp 97.5  Ventilator Settings 21% T-bar  . General: Comfortable at this time . Eyes: Grossly normal lids, irises & conjunctiva . ENT: grossly tongue is normal . Neck: no obvious mass . Cardiovascular: S1 S2 normal no gallop . Respiratory: No rales or rhonchi noted . Abdomen: soft . Skin: no rash seen on limited exam . Musculoskeletal: not rigid . Psychiatric:unable to assess . Neurologic: no seizure no involuntary movements         Lab Data:   Basic Metabolic Panel: Recent Labs  Lab 07/19/20 0548 07/20/20 1103 07/21/20 0717 07/23/20 0939 07/25/20 0659  NA 154* 151* 144 143 145  K 4.6 4.3 3.9 3.8 3.6  CL 123* 119* 113* 113* 117*  CO2 20* 21* 20* 19* 19*  GLUCOSE 168* 145* 166* 127* 153*  BUN 79* 64* 57* 57* 66*  CREATININE 1.78* 1.45* 1.31* 1.30* 1.23  CALCIUM 9.6 9.5 9.2 9.5 9.8  MG 2.3 2.0 1.9 2.0 2.0  PHOS 4.2 4.6 4.2 4.4 3.8    ABG: No results for input(s): PHART, PCO2ART, PO2ART, HCO3, O2SAT in the last 168 hours.  Liver Function Tests: Recent Labs  Lab 07/19/20 0548 07/20/20 1103 07/21/20 0717 07/23/20 0939 07/25/20 0659  ALBUMIN 1.9* 2.1* 1.8* 2.1* 2.1*   No results for input(s): LIPASE, AMYLASE in the last 168 hours. Recent Labs  Lab 07/23/20 0939  AMMONIA 41*    CBC: Recent Labs  Lab 07/19/20 0548 07/21/20 0717 07/23/20 0939 07/25/20 0659  WBC 16.1* 11.5*  12.6* 15.0*  HGB 8.9* 9.3* 10.2* 10.5*  HCT 29.6* 30.8* 33.9* 33.8*  MCV 96.7 96.9 95.5 94.9  PLT 537* 447* 396 418*    Cardiac Enzymes: No results for input(s): CKTOTAL, CKMB, CKMBINDEX, TROPONINI in the last 168 hours.  BNP (last 3 results) Recent Labs    06/29/20 0009  BNP 412.0*    ProBNP (last 3 results) No results for input(s): PROBNP in the last 8760 hours.  Radiological Exams: No results found.  Assessment/Plan Active Problems:   Acute on chronic respiratory failure with hypoxia (HCC)   Chronic anoxic encephalopathy (HCC)   Chronic atrial fibrillation (HCC)   Acute renal failure due to tubular necrosis [facility])   Cardiac arrest ([place])   1. Acute on chronic respiratory failure hypoxiapatient remains on T-bar 21% this time has been weaning down to room air doing well. Has moderate to large amount of secretions. We will continue aggressive pulmonary toilet supportive measures secretion management. 2. Chronic anoxic encephalopathy no sign of improvement 3. Chronic atrial fibrillation rate controlled 4. Acute renal failure no change we will continue to monitor 5. Cardiac arrest regular rhythm has been stable we will monitor closely   I have personally seen and evaluated the patient, evaluated laboratory and imaging results, formulated the assessment and plan and placed orders. The Patient requires high complexity decision making with multiple systems involvement.  Rounds  were done with the Respiratory Therapy Director and Staff therapists and discussed with nursing staff also.  Allyne Gee, MD [facility] Pulmonary Critical Care Medicine Sleep Medicine

## 2020-07-26 LAB — RENAL FUNCTION PANEL
Albumin: 2 g/dL — ABNORMAL LOW (ref 3.5–5.0)
Anion gap: 10 (ref 5–15)
BUN: 60 mg/dL — ABNORMAL HIGH (ref 8–23)
CO2: 19 mmol/L — ABNORMAL LOW (ref 22–32)
Calcium: 9.9 mg/dL (ref 8.9–10.3)
Chloride: 114 mmol/L — ABNORMAL HIGH (ref 98–111)
Creatinine, Ser: 1.28 mg/dL — ABNORMAL HIGH (ref 0.61–1.24)
GFR calc Af Amer: >60 mL/min (ref >60)
GFR calc non Af Amer: 55 mL/min — ABNORMAL LOW (ref >60)
Glucose, Bld: 155 mg/dL — ABNORMAL HIGH (ref 70–99)
Phosphorus: 3.8 mg/dL (ref 2.5–4.6)
Potassium: 3.7 mmol/L (ref 3.5–5.1)
Sodium: 143 mmol/L (ref 135–145)

## 2020-07-26 LAB — CBC
HCT: 33.1 % — ABNORMAL LOW (ref 39.0–52.0)
Hemoglobin: 10.4 g/dL — ABNORMAL LOW (ref 13.0–17.0)
MCH: 29.9 pg (ref 26.0–34.0)
MCHC: 31.4 g/dL (ref 30.0–36.0)
MCV: 95.1 fL (ref 80.0–100.0)
Platelets: 401 10*3/uL — ABNORMAL HIGH (ref 150–400)
RBC: 3.48 MIL/uL — ABNORMAL LOW (ref 4.22–5.81)
RDW: 14.4 % (ref 11.5–15.5)
WBC: 15.8 10*3/uL — ABNORMAL HIGH (ref 4.0–10.5)
nRBC: 0 % (ref 0.0–0.2)

## 2020-07-26 LAB — MAGNESIUM: Magnesium: 1.8 mg/dL (ref 1.7–2.4)

## 2020-07-26 NOTE — Progress Notes (Addendum)
Pulmonary Critical Care Medicine [place]   PULMONARY CRITICAL CARE SERVICE  PROGRESS NOTE  Date of Service: 07/26/2020  ESTEPHAN GALLARDO  XNA:[id number]  DOB: [birth year]   DOA: 07/16/2020  Referring Physician: Merton Border, MD  HPI: Christopher [name] is a 74 y.o. male seen for follow up of Acute on Chronic Respiratory Failure.  Patient continues on T-bar 21% FiO2 satting well no fever or distress.  Medications: Reviewed on Rounds  Physical Exam:  Vitals: Pulse 78 respirations 28 BP 132/76 O2 sat 96% temp 98.3  Ventilator Settings 21% ATC  . General: Comfortable at this time . Eyes: Grossly normal lids, irises & conjunctiva . ENT: grossly tongue is normal . Neck: no obvious mass . Cardiovascular: S1 S2 normal no gallop . Respiratory: No rales or rhonchi noted . Abdomen: soft . Skin: no rash seen on limited exam . Musculoskeletal: not rigid . Psychiatric:unable to assess . Neurologic: no seizure no involuntary movements         Lab Data:   Basic Metabolic Panel: Recent Labs  Lab 07/20/20 1103 07/21/20 0717 07/23/20 0939 07/25/20 0659 07/26/20 0654  NA 151* 144 143 145 143  K 4.3 3.9 3.8 3.6 3.7  CL 119* 113* 113* 117* 114*  CO2 21* 20* 19* 19* 19*  GLUCOSE 145* 166* 127* 153* 155*  BUN 64* 57* 57* 66* 60*  CREATININE 1.45* 1.31* 1.30* 1.23 1.28*  CALCIUM 9.5 9.2 9.5 9.8 9.9  MG 2.0 1.9 2.0 2.0 1.8  PHOS 4.6 4.2 4.4 3.8 3.8    ABG: No results for input(s): PHART, PCO2ART, PO2ART, HCO3, O2SAT in the last 168 hours.  Liver Function Tests: Recent Labs  Lab 07/20/20 1103 07/21/20 0717 07/23/20 0939 07/25/20 0659 07/26/20 0654  ALBUMIN 2.1* 1.8* 2.1* 2.1* 2.0*   No results for input(s): LIPASE, AMYLASE in the last 168 hours. Recent Labs  Lab 07/23/20 0939  AMMONIA 41*    CBC: Recent Labs  Lab 07/21/20 0717 07/23/20 0939 07/25/20 0659 07/26/20 0654  WBC 11.5* 12.6* 15.0* 15.8*  HGB 9.3* 10.2* 10.5* 10.4*  HCT 30.8* 33.9*  33.8* 33.1*  MCV 96.9 95.5 94.9 95.1  PLT 447* 396 418* 401*    Cardiac Enzymes: No results for input(s): CKTOTAL, CKMB, CKMBINDEX, TROPONINI in the last 168 hours.  BNP (last 3 results) Recent Labs    06/29/20 0009  BNP 412.0*    ProBNP (last 3 results) No results for input(s): PROBNP in the last 8760 hours.  Radiological Exams: No results found.  Assessment/Plan Active Problems:   Acute on chronic respiratory failure with hypoxia (HCC)   Chronic anoxic encephalopathy (HCC)   Chronic atrial fibrillation (HCC)   Acute renal failure due to tubular necrosis [facility])   Cardiac arrest ([place])   1. Acute on chronic respiratory failure hypoxiapatient remains on T-bar 21% this time we will continue secretion management pulmonary toilet and supportive measures. 2. Chronic anoxic encephalopathy no sign of improvement 3. Chronic atrial fibrillation rate controlled 4. Acute renal failure no change we will continue to monitor 5. Cardiac arrest regular rhythm has been stable we will monitor closely   I have personally seen and evaluated the patient, evaluated laboratory and imaging results, formulated the assessment and plan and placed orders. The Patient requires high complexity decision making with multiple systems involvement.  Rounds were done with the Respiratory Therapy Director and Staff therapists and discussed with nursing staff also.  Allyne Gee, MD [facility] Pulmonary Critical Care Medicine Sleep Medicine

## 2020-07-27 NOTE — Progress Notes (Addendum)
Pulmonary Critical Care Medicine [place]   PULMONARY CRITICAL CARE SERVICE  PROGRESS NOTE  Date of Service: 07/27/2020  Christopher [name]  IRW:[id number]  DOB: [birth year]   DOA: 07/16/2020  Referring Physician: Merton Border, MD  HPI: Christopher [name] is a 74 y.o. male seen for follow up of Acute on Chronic Respiratory Failure.  Patient mains on T-bar 21% satting well no fever or distress at this time.  Medications: Reviewed on Rounds  Physical Exam:  Vitals: Pulse 70 respirations 31 BP 137/61 O2 sat 96% 0.6  Ventilator Settings 21% T-bar  . General: Comfortable at this time . Eyes: Grossly normal lids, irises & conjunctiva . ENT: grossly tongue is normal . Neck: no obvious mass . Cardiovascular: S1 S2 normal no gallop . Respiratory: No rales or rhonchi noted . Abdomen: soft . Skin: no rash seen on limited exam . Musculoskeletal: not rigid . Psychiatric:unable to assess . Neurologic: no seizure no involuntary movements         Lab Data:   Basic Metabolic Panel: Recent Labs  Lab 07/21/20 0717 07/23/20 0939 07/25/20 0659 07/26/20 0654  NA 144 143 145 143  K 3.9 3.8 3.6 3.7  CL 113* 113* 117* 114*  CO2 20* 19* 19* 19*  GLUCOSE 166* 127* 153* 155*  BUN 57* 57* 66* 60*  CREATININE 1.31* 1.30* 1.23 1.28*  CALCIUM 9.2 9.5 9.8 9.9  MG 1.9 2.0 2.0 1.8  PHOS 4.2 4.4 3.8 3.8    ABG: No results for input(s): PHART, PCO2ART, PO2ART, HCO3, O2SAT in the last 168 hours.  Liver Function Tests: Recent Labs  Lab 07/21/20 0717 07/23/20 0939 07/25/20 0659 07/26/20 0654  ALBUMIN 1.8* 2.1* 2.1* 2.0*   No results for input(s): LIPASE, AMYLASE in the last 168 hours. Recent Labs  Lab 07/23/20 0939  AMMONIA 41*    CBC: Recent Labs  Lab 07/21/20 0717 07/23/20 0939 07/25/20 0659 07/26/20 0654  WBC 11.5* 12.6* 15.0* 15.8*  HGB 9.3* 10.2* 10.5* 10.4*  HCT 30.8* 33.9* 33.8* 33.1*  MCV 96.9 95.5 94.9 95.1  PLT 447* 396 418* 401*    Cardiac  Enzymes: No results for input(s): CKTOTAL, CKMB, CKMBINDEX, TROPONINI in the last 168 hours.  BNP (last 3 results) Recent Labs    06/29/20 0009  BNP 412.0*    ProBNP (last 3 results) No results for input(s): PROBNP in the last 8760 hours.  Radiological Exams: No results found.  Assessment/Plan Active Problems:   Acute on chronic respiratory failure with hypoxia (HCC)   Chronic anoxic encephalopathy (HCC)   Chronic atrial fibrillation (HCC)   Acute renal failure due to tubular necrosis [facility])   Cardiac arrest ([place])   1. Acute on chronic respiratory failure hypoxiapatient remains on T-bar 21% this time we will continue secretion management pulmonary toilet and supportive measures. 2. Chronic anoxic encephalopathy no sign of improvement 3. Chronic atrial fibrillation rate controlled 4. Acute renal failure no change we will continue to monitor 5. Cardiac arrest regular rhythm has been stable we will monitor closely   I have personally seen and evaluated the patient, evaluated laboratory and imaging results, formulated the assessment and plan and placed orders. The Patient requires high complexity decision making with multiple systems involvement.  Rounds were done with the Respiratory Therapy Director and Staff therapists and discussed with nursing staff also.  Allyne Gee, MD [facility] Pulmonary Critical Care Medicine Sleep Medicine

## 2020-07-28 ENCOUNTER — Other Ambulatory Visit (HOSPITAL_COMMUNITY): Payer: Self-pay

## 2020-07-28 LAB — RENAL FUNCTION PANEL
Albumin: 2.2 g/dL — ABNORMAL LOW (ref 3.5–5.0)
Anion gap: 11 (ref 5–15)
BUN: 74 mg/dL — ABNORMAL HIGH (ref 8–23)
CO2: 20 mmol/L — ABNORMAL LOW (ref 22–32)
Calcium: 10.3 mg/dL (ref 8.9–10.3)
Chloride: 119 mmol/L — ABNORMAL HIGH (ref 98–111)
Creatinine, Ser: 1.3 mg/dL — ABNORMAL HIGH (ref 0.61–1.24)
GFR calc Af Amer: >60 mL/min (ref >60)
GFR calc non Af Amer: 54 mL/min — ABNORMAL LOW (ref >60)
Glucose, Bld: 139 mg/dL — ABNORMAL HIGH (ref 70–99)
Phosphorus: 3.8 mg/dL (ref 2.5–4.6)
Potassium: 3.5 mmol/L (ref 3.5–5.1)
Sodium: 150 mmol/L — ABNORMAL HIGH (ref 135–145)

## 2020-07-28 LAB — CBC
HCT: 36.8 % — ABNORMAL LOW (ref 39.0–52.0)
Hemoglobin: 11.2 g/dL — ABNORMAL LOW (ref 13.0–17.0)
MCH: 28.9 pg (ref 26.0–34.0)
MCHC: 30.4 g/dL (ref 30.0–36.0)
MCV: 95.1 fL (ref 80.0–100.0)
Platelets: 419 10*3/uL — ABNORMAL HIGH (ref 150–400)
RBC: 3.87 MIL/uL — ABNORMAL LOW (ref 4.22–5.81)
RDW: 15.4 % (ref 11.5–15.5)
WBC: 25.1 10*3/uL — ABNORMAL HIGH (ref 4.0–10.5)
nRBC: 0 % (ref 0.0–0.2)

## 2020-07-28 LAB — MAGNESIUM: Magnesium: 2.1 mg/dL (ref 1.7–2.4)

## 2020-07-28 LAB — URINALYSIS, ROUTINE W REFLEX MICROSCOPIC
Bilirubin Urine: NEGATIVE
Glucose, UA: NEGATIVE mg/dL
Hgb urine dipstick: NEGATIVE
Ketones, ur: NEGATIVE mg/dL
Leukocytes,Ua: NEGATIVE
Nitrite: NEGATIVE
Protein, ur: 30 mg/dL — AB
Specific Gravity, Urine: 1.021 (ref 1.005–1.030)
pH: 5 (ref 5.0–8.0)

## 2020-07-28 NOTE — Progress Notes (Signed)
Pulmonary Critical Care Medicine [place]   PULMONARY CRITICAL CARE SERVICE  PROGRESS NOTE  Date of Service: 07/28/2020  Christopher [name]  EGB:[id number]  DOB: January 10, 1946   DOA: 07/16/2020  Referring Physician: Merton Border, MD  HPI: Christopher [name] is a 74 y.o. male seen for follow up of Acute on Chronic Respiratory Failure.  Patient currently is on T collar has been on room air secretions are still quite copious limiting factor as far as being able to wean.  Medications: Reviewed on Rounds  Physical Exam:  Vitals: Temperature 97.4 pulse 78 respiratory 34 blood pressure is 122/52 saturations 92%  Ventilator Settings on T collar room air  . General: Comfortable at this time . Eyes: Grossly normal lids, irises & conjunctiva . ENT: grossly tongue is normal . Neck: no obvious mass . Cardiovascular: S1 S2 normal no gallop . Respiratory: No rhonchi very coarse breath sounds . Abdomen: soft . Skin: no rash seen on limited exam . Musculoskeletal: not rigid . Psychiatric:unable to assess . Neurologic: no seizure no involuntary movements         Lab Data:   Basic Metabolic Panel: Recent Labs  Lab 07/23/20 0939 07/25/20 0659 07/26/20 0654 07/28/20 0852  NA 143 145 143 150*  K 3.8 3.6 3.7 3.5  CL 113* 117* 114* 119*  CO2 19* 19* 19* 20*  GLUCOSE 127* 153* 155* 139*  BUN 57* 66* 60* 74*  CREATININE 1.30* 1.23 1.28* 1.30*  CALCIUM 9.5 9.8 9.9 10.3  MG 2.0 2.0 1.8 2.1  PHOS 4.4 3.8 3.8 3.8    ABG: No results for input(s): PHART, PCO2ART, PO2ART, HCO3, O2SAT in the last 168 hours.  Liver Function Tests: Recent Labs  Lab 07/23/20 0939 07/25/20 0659 07/26/20 0654 07/28/20 0852  ALBUMIN 2.1* 2.1* 2.0* 2.2*   No results for input(s): LIPASE, AMYLASE in the last 168 hours. Recent Labs  Lab 07/23/20 0939  AMMONIA 41*    CBC: Recent Labs  Lab 07/23/20 0939 07/25/20 0659 07/26/20 0654 07/28/20 0852  WBC 12.6* 15.0* 15.8* 25.1*  HGB  10.2* 10.5* 10.4* 11.2*  HCT 33.9* 33.8* 33.1* 36.8*  MCV 95.5 94.9 95.1 95.1  PLT 396 418* 401* 419*    Cardiac Enzymes: No results for input(s): CKTOTAL, CKMB, CKMBINDEX, TROPONINI in the last 168 hours.  BNP (last 3 results) Recent Labs    06/29/20 0009  BNP 412.0*    ProBNP (last 3 results) No results for input(s): PROBNP in the last 8760 hours.  Radiological Exams: No results found.  Assessment/Plan Active Problems:   Acute on chronic respiratory failure with hypoxia (HCC)   Chronic anoxic encephalopathy (HCC)   Chronic atrial fibrillation (HCC)   Acute renal failure due to tubular necrosis [facility])   Cardiac arrest ([place])   1. Acute on chronic respiratory failure hypoxia we will continue with T collar trials titrate oxygen continue pulmonary toilet 2. Chronic anoxic encephalopathy no change we will continue to follow 3. Chronic atrial fibrillation rate is controlled 4. Acute renal failure due to necrosis at baseline we will continue to monitor 5. Cardiac arrest rhythm has been stable we will continue to follow along   I have personally seen and evaluated the patient, evaluated laboratory and imaging results, formulated the assessment and plan and placed orders. The Patient requires high complexity decision making with multiple systems involvement.  Rounds were done with the Respiratory Therapy Director and Staff therapists and discussed with nursing staff also.  Allyne Gee, MD  [facility] Pulmonary Critical Care Medicine Sleep Medicine

## 2020-07-29 LAB — CBC
HCT: 36.3 % — ABNORMAL LOW (ref 39.0–52.0)
Hemoglobin: 11.5 g/dL — ABNORMAL LOW (ref 13.0–17.0)
MCH: 30.1 pg (ref 26.0–34.0)
MCHC: 31.7 g/dL (ref 30.0–36.0)
MCV: 95 fL (ref 80.0–100.0)
Platelets: 405 10*3/uL — ABNORMAL HIGH (ref 150–400)
RBC: 3.82 MIL/uL — ABNORMAL LOW (ref 4.22–5.81)
RDW: 16 % — ABNORMAL HIGH (ref 11.5–15.5)
WBC: 21.9 10*3/uL — ABNORMAL HIGH (ref 4.0–10.5)
nRBC: 0 % (ref 0.0–0.2)

## 2020-07-29 LAB — BASIC METABOLIC PANEL
Anion gap: 10 (ref 5–15)
BUN: 81 mg/dL — ABNORMAL HIGH (ref 8–23)
CO2: 20 mmol/L — ABNORMAL LOW (ref 22–32)
Calcium: 10.1 mg/dL (ref 8.9–10.3)
Chloride: 119 mmol/L — ABNORMAL HIGH (ref 98–111)
Creatinine, Ser: 1.29 mg/dL — ABNORMAL HIGH (ref 0.61–1.24)
GFR calc Af Amer: >60 mL/min (ref >60)
GFR calc non Af Amer: 54 mL/min — ABNORMAL LOW (ref >60)
Glucose, Bld: 156 mg/dL — ABNORMAL HIGH (ref 70–99)
Potassium: 3.3 mmol/L — ABNORMAL LOW (ref 3.5–5.1)
Sodium: 149 mmol/L — ABNORMAL HIGH (ref 135–145)

## 2020-07-29 NOTE — Progress Notes (Addendum)
Pulmonary Critical Care Medicine [place]   PULMONARY CRITICAL CARE SERVICE  PROGRESS NOTE  Date of Service: 07/29/2020  Christopher [name]  RSW:[id number]  DOB: 1946-11-03   DOA: 07/16/2020  Referring Physician: Merton Border, MD  HPI: Christopher [name] is a 74 y.o. male seen for follow up of Acute on Chronic Respiratory Failure.  Patient remains on 21% aerosol trach collar currently satting well no distress.  Medications: Reviewed on Rounds  Physical Exam:  Vitals: Pulse 85 respirations 30 BP 143/54 O2 sat 94% temp 98.1  Ventilator Settings ATC 21%  . General: Comfortable at this time . Eyes: Grossly normal lids, irises & conjunctiva . ENT: grossly tongue is normal . Neck: no obvious mass . Cardiovascular: S1 S2 normal no gallop . Respiratory: No rales or rhonchi noted . Abdomen: soft . Skin: no rash seen on limited exam . Musculoskeletal: not rigid . Psychiatric:unable to assess . Neurologic: no seizure no involuntary movements         Lab Data:   Basic Metabolic Panel: Recent Labs  Lab 07/23/20 0939 07/25/20 0659 07/26/20 0654 07/28/20 0852 07/29/20 1000  NA 143 145 143 150* 149*  K 3.8 3.6 3.7 3.5 3.3*  CL 113* 117* 114* 119* 119*  CO2 19* 19* 19* 20* 20*  GLUCOSE 127* 153* 155* 139* 156*  BUN 57* 66* 60* 74* 81*  CREATININE 1.30* 1.23 1.28* 1.30* 1.29*  CALCIUM 9.5 9.8 9.9 10.3 10.1  MG 2.0 2.0 1.8 2.1  --   PHOS 4.4 3.8 3.8 3.8  --     ABG: No results for input(s): PHART, PCO2ART, PO2ART, HCO3, O2SAT in the last 168 hours.  Liver Function Tests: Recent Labs  Lab 07/23/20 0939 07/25/20 0659 07/26/20 0654 07/28/20 0852  ALBUMIN 2.1* 2.1* 2.0* 2.2*   No results for input(s): LIPASE, AMYLASE in the last 168 hours. Recent Labs  Lab 07/23/20 0939  AMMONIA 41*    CBC: Recent Labs  Lab 07/23/20 0939 07/25/20 0659 07/26/20 0654 07/28/20 0852 07/29/20 1000  WBC 12.6* 15.0* 15.8* 25.1* 21.9*  HGB 10.2* 10.5* 10.4* 11.2*  11.5*  HCT 33.9* 33.8* 33.1* 36.8* 36.3*  MCV 95.5 94.9 95.1 95.1 95.0  PLT 396 418* 401* 419* 405*    Cardiac Enzymes: No results for input(s): CKTOTAL, CKMB, CKMBINDEX, TROPONINI in the last 168 hours.  BNP (last 3 results) Recent Labs    06/29/20 0009  BNP 412.0*    ProBNP (last 3 results) No results for input(s): PROBNP in the last 8760 hours.  Radiological Exams: DG CHEST PORT 1 VIEW  Result Date: 07/28/2020 CLINICAL DATA:  Leukocytosis. EXAM: PORTABLE CHEST 1 VIEW COMPARISON:  Most recent radiograph 07/21/2020, lung bases from abdominal CT 07/10/2020 FINDINGS: Tracheostomy tube tip remains at the thoracic inlet. Normal heart size and mediastinal contours. Rounded right infrahilar densities correspond to pulmonary vessels on lung bases from prior CT abdomen. Improving but persistent streaky opacity at the right lung base. No new consolidation. Background chronic bronchial thickening. No pleural fluid or pneumothorax. Stable osseous structures. IMPRESSION: Improving but persistent streaky opacity at the right lung base, may represent atelectasis or pneumonia. No new consolidation. Electronically Signed   By: Keith Rake M.D.   On: 07/28/2020 15:01    Assessment/Plan Active Problems:   Acute on chronic respiratory failure with hypoxia (HCC)   Chronic anoxic encephalopathy (HCC)   Chronic atrial fibrillation (HCC)   Acute renal failure due to tubular necrosis [facility])   Cardiac arrest ([place])  1. Acute on chronic respiratory failure hypoxia we will continue with T collar trials titrate oxygen continue pulmonary toilet 2. Chronic anoxic encephalopathy no change we will continue to follow 3. Chronic atrial fibrillation rate is controlled 4. Acute renal failure due to necrosis at baseline we will continue to monitor 5. Cardiac arrest rhythm has been stable we will continue to follow along   I have personally seen and evaluated the patient, evaluated laboratory and imaging  results, formulated the assessment and plan and placed orders. The Patient requires high complexity decision making with multiple systems involvement.  Rounds were done with the Respiratory Therapy Director and Staff therapists and discussed with nursing staff also.  Allyne Gee, MD [facility] Pulmonary Critical Care Medicine Sleep Medicine

## 2020-07-30 LAB — CULTURE, RESPIRATORY W GRAM STAIN: Culture: NORMAL

## 2020-07-30 LAB — SODIUM: Sodium: 148 mmol/L — ABNORMAL HIGH (ref 135–145)

## 2020-07-30 LAB — POTASSIUM: Potassium: 3.6 mmol/L (ref 3.5–5.1)

## 2020-07-30 LAB — AMMONIA: Ammonia: 89 umol/L — ABNORMAL HIGH (ref 9–35)

## 2020-07-30 NOTE — Progress Notes (Addendum)
Pulmonary Critical Care Medicine [place]   PULMONARY CRITICAL CARE SERVICE  PROGRESS NOTE  Date of Service: 07/30/2020  Christopher [name]  [id number]:[id number]  DOB: 1946-02-13   DOA: 07/16/2020  Referring Physician: Merton Border, MD  HPI: Christopher [name] is a 74 y.o. male seen for follow up of Acute on Chronic Respiratory Failure.  Patient is comfortable right now without distress at this time  Medications: Reviewed on Rounds  Physical Exam:  Vitals: Temperature is 98 pulse 90 respiratory rate 30 blood pressure was 129/60 saturations 93%  Ventilator Settings off the ventilator on T collar    General: Comfortable at this time  Eyes: Grossly normal lids, irises & conjunctiva  ENT: grossly tongue is normal  Neck: no obvious mass  Cardiovascular: S1 S2 normal no gallop  Respiratory: No rhonchi no rales noted at this time  Abdomen: soft  Skin: no rash seen on limited exam  Musculoskeletal: not rigid  Psychiatric:unable to assess  Neurologic: no seizure no involuntary movements         Lab Data:   Basic Metabolic Panel: Recent Labs  Lab 07/25/20 0659 07/26/20 0654 07/28/20 0852 07/29/20 1000 07/30/20 1115 07/30/20 1300  NA 145 143 150* 149*  --  148*  K 3.6 3.7 3.5 3.3* 3.6  --   CL 117* 114* 119* 119*  --   --   CO2 19* 19* 20* 20*  --   --   GLUCOSE 153* 155* 139* 156*  --   --   BUN 66* 60* 74* 81*  --   --   CREATININE 1.23 1.28* 1.30* 1.29*  --   --   CALCIUM 9.8 9.9 10.3 10.1  --   --   MG 2.0 1.8 2.1  --   --   --   PHOS 3.8 3.8 3.8  --   --   --     ABG: No results for input(s): PHART, PCO2ART, PO2ART, HCO3, O2SAT in the last 168 hours.  Liver Function Tests: Recent Labs  Lab 07/25/20 0659 07/26/20 0654 07/28/20 0852  ALBUMIN 2.1* 2.0* 2.2*   No results for input(s): LIPASE, AMYLASE in the last 168 hours. Recent Labs  Lab 07/30/20 1115  AMMONIA 89*    CBC: Recent Labs  Lab 07/25/20 0659 07/26/20 0654  07/28/20 0852 07/29/20 1000  WBC 15.0* 15.8* 25.1* 21.9*  HGB 10.5* 10.4* 11.2* 11.5*  HCT 33.8* 33.1* 36.8* 36.3*  MCV 94.9 95.1 95.1 95.0  PLT 418* 401* 419* 405*    Cardiac Enzymes: No results for input(s): CKTOTAL, CKMB, CKMBINDEX, TROPONINI in the last 168 hours.  BNP (last 3 results) Recent Labs    06/29/20 0009  BNP 412.0*    ProBNP (last 3 results) No results for input(s): PROBNP in the last 8760 hours.  Radiological Exams: No results found.  Assessment/Plan Active Problems:   Acute on chronic respiratory failure with hypoxia (HCC)   Chronic anoxic encephalopathy (HCC)   Chronic atrial fibrillation (HCC)   Acute renal failure due to tubular necrosis [facility])   Cardiac arrest ([place])   1. Acute on chronic respiratory failure with hypoxia we will continue with T collar titrate oxygen continue pulmonary toilet patient right now is on room air as already noted above and has been doing well.  Mental status and secretions remain limiting factor. 2. Chronic anoxic encephalopathy no change 3. Chronic atrial fibrillation rate is controlled 4. Acute renal failure due to tubular necrosis at baseline 5. Cardiac arrest  rhythm stable   I have personally seen and evaluated the patient, evaluated laboratory and imaging results, formulated the assessment and plan and placed orders. The Patient requires high complexity decision making with multiple systems involvement.  Rounds were done with the Respiratory Therapy Director and Staff therapists and discussed with nursing staff also.  Allyne Gee, MD [facility] Pulmonary Critical Care Medicine Sleep Medicine

## 2020-07-31 LAB — BASIC METABOLIC PANEL
Anion gap: 9 (ref 5–15)
BUN: 84 mg/dL — ABNORMAL HIGH (ref 8–23)
CO2: 22 mmol/L (ref 22–32)
Calcium: 10.3 mg/dL (ref 8.9–10.3)
Chloride: 119 mmol/L — ABNORMAL HIGH (ref 98–111)
Creatinine, Ser: 1.27 mg/dL — ABNORMAL HIGH (ref 0.61–1.24)
GFR calc Af Amer: >60 mL/min (ref >60)
GFR calc non Af Amer: 55 mL/min — ABNORMAL LOW (ref >60)
Glucose, Bld: 148 mg/dL — ABNORMAL HIGH (ref 70–99)
Potassium: 3.6 mmol/L (ref 3.5–5.1)
Sodium: 150 mmol/L — ABNORMAL HIGH (ref 135–145)

## 2020-07-31 LAB — CBC
HCT: 33.9 % — ABNORMAL LOW (ref 39.0–52.0)
Hemoglobin: 10.2 g/dL — ABNORMAL LOW (ref 13.0–17.0)
MCH: 28.7 pg (ref 26.0–34.0)
MCHC: 30.1 g/dL (ref 30.0–36.0)
MCV: 95.2 fL (ref 80.0–100.0)
Platelets: 351 10*3/uL (ref 150–400)
RBC: 3.56 MIL/uL — ABNORMAL LOW (ref 4.22–5.81)
RDW: 16.1 % — ABNORMAL HIGH (ref 11.5–15.5)
WBC: 17.9 10*3/uL — ABNORMAL HIGH (ref 4.0–10.5)
nRBC: 0 % (ref 0.0–0.2)

## 2020-07-31 LAB — MAGNESIUM: Magnesium: 2.1 mg/dL (ref 1.7–2.4)

## 2020-07-31 NOTE — Progress Notes (Signed)
Pulmonary Critical Care Medicine [place]   PULMONARY CRITICAL CARE SERVICE  PROGRESS NOTE  Date of Service: 07/31/2020  Christopher [name]  JXB:[id number]  DOB: 1946-08-29   DOA: 07/16/2020  Referring Physician: Merton Border, MD  HPI: Christopher [name] is a 74 y.o. male seen for follow up of Acute on Chronic Respiratory Failure.  On T collar right now has been on room air appears to be comfortable right now without distress  Medications: Reviewed on Rounds  Physical Exam:  Vitals: Temperature is 98.8 pulse 74 respiratory rate 20 blood pressure is 126/56 saturations are 97%  Ventilator Settings on T collar 21%  . General: Comfortable at this time . Eyes: Grossly normal lids, irises & conjunctiva . ENT: grossly tongue is normal . Neck: no obvious mass . Cardiovascular: S1 S2 normal no gallop . Respiratory: No rhonchi no rales are noted at this time . Abdomen: soft . Skin: no rash seen on limited exam . Musculoskeletal: not rigid . Psychiatric:unable to assess . Neurologic: no seizure no involuntary movements         Lab Data:   Basic Metabolic Panel: Recent Labs  Lab 07/25/20 0659 07/25/20 0659 07/26/20 0654 07/28/20 0852 07/29/20 1000 07/30/20 1115 07/30/20 1300 07/31/20 0704  NA 145   < > 143 150* 149*  --  148* 150*  K 3.6   < > 3.7 3.5 3.3* 3.6  --  3.6  CL 117*  --  114* 119* 119*  --   --  119*  CO2 19*  --  19* 20* 20*  --   --  22  GLUCOSE 153*  --  155* 139* 156*  --   --  148*  BUN 66*  --  60* 74* 81*  --   --  84*  CREATININE 1.23  --  1.28* 1.30* 1.29*  --   --  1.27*  CALCIUM 9.8  --  9.9 10.3 10.1  --   --  10.3  MG 2.0  --  1.8 2.1  --   --   --  2.1  PHOS 3.8  --  3.8 3.8  --   --   --   --    < > = values in this interval not displayed.    ABG: No results for input(s): PHART, PCO2ART, PO2ART, HCO3, O2SAT in the last 168 hours.  Liver Function Tests: Recent Labs  Lab 07/25/20 0659 07/26/20 0654 07/28/20 0852   ALBUMIN 2.1* 2.0* 2.2*   No results for input(s): LIPASE, AMYLASE in the last 168 hours. Recent Labs  Lab 07/30/20 1115  AMMONIA 89*    CBC: Recent Labs  Lab 07/25/20 0659 07/26/20 0654 07/28/20 0852 07/29/20 1000 07/31/20 0704  WBC 15.0* 15.8* 25.1* 21.9* 17.9*  HGB 10.5* 10.4* 11.2* 11.5* 10.2*  HCT 33.8* 33.1* 36.8* 36.3* 33.9*  MCV 94.9 95.1 95.1 95.0 95.2  PLT 418* 401* 419* 405* 351    Cardiac Enzymes: No results for input(s): CKTOTAL, CKMB, CKMBINDEX, TROPONINI in the last 168 hours.  BNP (last 3 results) Recent Labs    06/29/20 0009  BNP 412.0*    ProBNP (last 3 results) No results for input(s): PROBNP in the last 8760 hours.  Radiological Exams: No results found.  Assessment/Plan Active Problems:   Acute on chronic respiratory failure with hypoxia (HCC)   Chronic anoxic encephalopathy (HCC)   Chronic atrial fibrillation (HCC)   Acute renal failure due to tubular necrosis [facility])   Cardiac  arrest ([place])   1. Acute on chronic respiratory failure hypoxia we will continue with T collar titrate oxygen continue pulmonary toilet.  Continues to have copious thick secretions 2. Chronic atrial fibrillation rate is controlled 3. Acute renal failure due to necrosis no change 4. Cardiac arrest rhythm has been stable 5. Chronic anoxic encephalopathy no change   I have personally seen and evaluated the patient, evaluated laboratory and imaging results, formulated the assessment and plan and placed orders. The Patient requires high complexity decision making with multiple systems involvement.  Rounds were done with the Respiratory Therapy Director and Staff therapists and discussed with nursing staff also.  Allyne Gee, MD [facility] Pulmonary Critical Care Medicine Sleep Medicine

## 2020-08-01 LAB — BASIC METABOLIC PANEL
Anion gap: 9 (ref 5–15)
BUN: 82 mg/dL — ABNORMAL HIGH (ref 8–23)
CO2: 23 mmol/L (ref 22–32)
Calcium: 10.1 mg/dL (ref 8.9–10.3)
Chloride: 118 mmol/L — ABNORMAL HIGH (ref 98–111)
Creatinine, Ser: 1.26 mg/dL — ABNORMAL HIGH (ref 0.61–1.24)
GFR calc Af Amer: >60 mL/min (ref >60)
GFR calc non Af Amer: 56 mL/min — ABNORMAL LOW (ref >60)
Glucose, Bld: 149 mg/dL — ABNORMAL HIGH (ref 70–99)
Potassium: 3.6 mmol/L (ref 3.5–5.1)
Sodium: 150 mmol/L — ABNORMAL HIGH (ref 135–145)

## 2020-08-01 LAB — AMMONIA: Ammonia: 48 umol/L — ABNORMAL HIGH (ref 9–35)

## 2020-08-01 NOTE — Progress Notes (Addendum)
Pulmonary Critical Care Medicine [place]   PULMONARY CRITICAL CARE SERVICE  PROGRESS NOTE  Date of Service: 08/01/2020  JAKOBY MELENDREZ  QAS:[id number]  DOB: 04-23-1946   DOA: 07/16/2020  Referring Physician: Merton Border, MD  HPI: AHMON TOSI is a 74 y.o. male seen for follow up of Acute on Chronic Respiratory Failure.  Patient remains on 21% T-bar at this time satting well no distress.  Medications: Reviewed on Rounds  Physical Exam:  Vitals: Pulse 90 respirations 28 BP 115/53 O2 sat 95% temp 99.4  Ventilator Settings 21% T-bar  . General: Comfortable at this time . Eyes: Grossly normal lids, irises & conjunctiva . ENT: grossly tongue is normal . Neck: no obvious mass . Cardiovascular: S1 S2 normal no gallop . Respiratory: Coarse breath sounds . Abdomen: soft . Skin: no rash seen on limited exam . Musculoskeletal: not rigid . Psychiatric:unable to assess . Neurologic: no seizure no involuntary movements         Lab Data:   Basic Metabolic Panel: Recent Labs  Lab 07/26/20 0654 07/26/20 0654 07/28/20 0852 07/29/20 1000 07/30/20 1115 07/30/20 1300 07/31/20 0704 08/01/20 0935  NA 143   < > 150* 149*  --  148* 150* 150*  K 3.7   < > 3.5 3.3* 3.6  --  3.6 3.6  CL 114*  --  119* 119*  --   --  119* 118*  CO2 19*  --  20* 20*  --   --  22 23  GLUCOSE 155*  --  139* 156*  --   --  148* 149*  BUN 60*  --  74* 81*  --   --  84* 82*  CREATININE 1.28*  --  1.30* 1.29*  --   --  1.27* 1.26*  CALCIUM 9.9  --  10.3 10.1  --   --  10.3 10.1  MG 1.8  --  2.1  --   --   --  2.1  --   PHOS 3.8  --  3.8  --   --   --   --   --    < > = values in this interval not displayed.    ABG: No results for input(s): PHART, PCO2ART, PO2ART, HCO3, O2SAT in the last 168 hours.  Liver Function Tests: Recent Labs  Lab 07/26/20 0654 07/28/20 0852  ALBUMIN 2.0* 2.2*   No results for input(s): LIPASE, AMYLASE in the last 168 hours. Recent Labs  Lab  07/30/20 1115 08/01/20 0935  AMMONIA 89* 48*    CBC: Recent Labs  Lab 07/26/20 0654 07/28/20 0852 07/29/20 1000 07/31/20 0704  WBC 15.8* 25.1* 21.9* 17.9*  HGB 10.4* 11.2* 11.5* 10.2*  HCT 33.1* 36.8* 36.3* 33.9*  MCV 95.1 95.1 95.0 95.2  PLT 401* 419* 405* 351    Cardiac Enzymes: No results for input(s): CKTOTAL, CKMB, CKMBINDEX, TROPONINI in the last 168 hours.  BNP (last 3 results) Recent Labs    06/29/20 0009  BNP 412.0*    ProBNP (last 3 results) No results for input(s): PROBNP in the last 8760 hours.  Radiological Exams: No results found.  Assessment/Plan Active Problems:   Acute on chronic respiratory failure with hypoxia (HCC)   Chronic anoxic encephalopathy (HCC)   Chronic atrial fibrillation (HCC)   Acute renal failure due to tubular necrosis [facility])   Cardiac arrest ([place])   1. Acute on chronic respiratory failure hypoxia we will continue with T collar titrate oxygen continue pulmonary  toilet. 2. Chronic atrial fibrillation rate is controlled 3. Acute renal failure due to necrosis no change 4. Cardiac arrest rhythm has been stable 5. Chronic anoxic encephalopathy no change   I have personally seen and evaluated the patient, evaluated laboratory and imaging results, formulated the assessment and plan and placed orders. The Patient requires high complexity decision making with multiple systems involvement.  Rounds were done with the Respiratory Therapy Director and Staff therapists and discussed with nursing staff also.  Allyne Gee, MD [facility] Pulmonary Critical Care Medicine Sleep Medicine

## 2020-08-02 LAB — URINE CULTURE: Culture: >=100000 — AB

## 2020-08-03 LAB — CBC
HCT: 33 % — ABNORMAL LOW (ref 39.0–52.0)
Hemoglobin: 9.9 g/dL — ABNORMAL LOW (ref 13.0–17.0)
MCH: 29.1 pg (ref 26.0–34.0)
MCHC: 30 g/dL (ref 30.0–36.0)
MCV: 97.1 fL (ref 80.0–100.0)
Platelets: 262 10*3/uL (ref 150–400)
RBC: 3.4 MIL/uL — ABNORMAL LOW (ref 4.22–5.81)
RDW: 15.9 % — ABNORMAL HIGH (ref 11.5–15.5)
WBC: 11.1 10*3/uL — ABNORMAL HIGH (ref 4.0–10.5)
nRBC: 0 % (ref 0.0–0.2)

## 2020-08-03 LAB — BASIC METABOLIC PANEL
Anion gap: 9 (ref 5–15)
BUN: 63 mg/dL — ABNORMAL HIGH (ref 8–23)
CO2: 22 mmol/L (ref 22–32)
Calcium: 9.3 mg/dL (ref 8.9–10.3)
Chloride: 114 mmol/L — ABNORMAL HIGH (ref 98–111)
Creatinine, Ser: 1.16 mg/dL (ref 0.61–1.24)
GFR calc Af Amer: >60 mL/min (ref >60)
GFR calc non Af Amer: >60 mL/min (ref >60)
Glucose, Bld: 167 mg/dL — ABNORMAL HIGH (ref 70–99)
Potassium: 3.5 mmol/L (ref 3.5–5.1)
Sodium: 145 mmol/L (ref 135–145)

## 2020-08-03 LAB — CULTURE, BLOOD (ROUTINE X 2)
Culture: NO GROWTH
Culture: NO GROWTH
Special Requests: ADEQUATE
Special Requests: ADEQUATE

## 2020-08-03 LAB — MAGNESIUM: Magnesium: 2 mg/dL (ref 1.7–2.4)

## 2020-08-03 NOTE — Progress Notes (Signed)
Pulmonary Critical Care Medicine [place]   PULMONARY CRITICAL CARE SERVICE  PROGRESS NOTE  Date of Service: 08/03/2020  Christopher [name]  ALP:[id number]  DOB: [birth year]   DOA: 07/16/2020  Referring Physician: Merton Border, MD  HPI: Christopher [name] is a 74 y.o. male seen for follow up of Acute on Chronic Respiratory Failure.  Patient currently is on T collar has been on room air secretions are very copious.  Medications: Reviewed on Rounds  Physical Exam:  Vitals: Temperature 99.0 pulse 81 respiratory 38 blood pressure 94/58 saturations 93%  Ventilator Settings off the ventilator on T collar room air  . General: Comfortable at this time . Eyes: Grossly normal lids, irises & conjunctiva . ENT: grossly tongue is normal . Neck: no obvious mass . Cardiovascular: S1 S2 normal no gallop . Respiratory: No rhonchi no rales are noted at this time . Abdomen: soft . Skin: no rash seen on limited exam . Musculoskeletal: not rigid . Psychiatric:unable to assess . Neurologic: no seizure no involuntary movements         Lab Data:   Basic Metabolic Panel: Recent Labs  Lab 07/28/20 0852 07/28/20 0852 07/29/20 1000 07/30/20 1115 07/30/20 1300 07/31/20 0704 08/01/20 0935 08/03/20 0449  NA 150*   < > 149*  --  148* 150* 150* 145  K 3.5   < > 3.3* 3.6  --  3.6 3.6 3.5  CL 119*  --  119*  --   --  119* 118* 114*  CO2 20*  --  20*  --   --  22 23 22   GLUCOSE 139*  --  156*  --   --  148* 149* 167*  BUN 74*  --  81*  --   --  84* 82* 63*  CREATININE 1.30*  --  1.29*  --   --  1.27* 1.26* 1.16  CALCIUM 10.3  --  10.1  --   --  10.3 10.1 9.3  MG 2.1  --   --   --   --  2.1  --  2.0  PHOS 3.8  --   --   --   --   --   --   --    < > = values in this interval not displayed.    ABG: No results for input(s): PHART, PCO2ART, PO2ART, HCO3, O2SAT in the last 168 hours.  Liver Function Tests: Recent Labs  Lab 07/28/20 0852  ALBUMIN 2.2*   No results for  input(s): LIPASE, AMYLASE in the last 168 hours. Recent Labs  Lab 07/30/20 1115 08/01/20 0935  AMMONIA 89* 48*    CBC: Recent Labs  Lab 07/28/20 0852 07/29/20 1000 07/31/20 0704 08/03/20 0449  WBC 25.1* 21.9* 17.9* 11.1*  HGB 11.2* 11.5* 10.2* 9.9*  HCT 36.8* 36.3* 33.9* 33.0*  MCV 95.1 95.0 95.2 97.1  PLT 419* 405* 351 262    Cardiac Enzymes: No results for input(s): CKTOTAL, CKMB, CKMBINDEX, TROPONINI in the last 168 hours.  BNP (last 3 results) Recent Labs    06/29/20 0009  BNP 412.0*    ProBNP (last 3 results) No results for input(s): PROBNP in the last 8760 hours.  Radiological Exams: No results found.  Assessment/Plan Active Problems:   Acute on chronic respiratory failure with hypoxia (HCC)   Chronic anoxic encephalopathy (HCC)   Chronic atrial fibrillation (HCC)   Acute renal failure due to tubular necrosis [facility])   Cardiac arrest ([place])   1. Acute on  chronic respiratory failure hypoxia currently is on T collar copious secretions requiring frequent suctioning.  Chest x-ray shows some right sided hemidiaphragmatic elevation could signify basilar atelectasis. 2. Chronic encephalopathy no change we will continue to follow 3. Chronic atrial fibrillation rate is controlled 4. Acute renal failure at baseline we will monitor 5. Cardiac arrest right now rhythm is stable continue to follow along   I have personally seen and evaluated the patient, evaluated laboratory and imaging results, formulated the assessment and plan and placed orders. The Patient requires high complexity decision making with multiple systems involvement.  Rounds were done with the Respiratory Therapy Director and Staff therapists and discussed with nursing staff also.  Allyne Gee, MD [facility] Pulmonary Critical Care Medicine Sleep Medicine

## 2020-08-04 LAB — AMMONIA: Ammonia: 27 umol/L (ref 9–35)

## 2020-08-04 NOTE — Progress Notes (Signed)
Pulmonary Critical Care Medicine [place]   PULMONARY CRITICAL CARE SERVICE  PROGRESS NOTE  Date of Service: 08/04/2020  DAYQUAN BUYS  MOQ:[id number]  DOB: 1946-10-03   DOA: 07/16/2020  Referring Physician: Merton Border, MD  HPI: Christopher [name] is a 74 y.o. male seen for follow up of Acute on Chronic Respiratory Failure.  Patient currently is on T collar on room air good saturations are noted  Medications: Reviewed on Rounds  Physical Exam:  Vitals: Temperature 97.6 pulse 71 respiratory rate 30 blood pressure is 115/50 saturations 94%  Ventilator Settings on T collar with room air  . General: Comfortable at this time . Eyes: Grossly normal lids, irises & conjunctiva . ENT: grossly tongue is normal . Neck: no obvious mass . Cardiovascular: S1 S2 normal no gallop . Respiratory: No rhonchi no rales are noted at this time . Abdomen: soft . Skin: no rash seen on limited exam . Musculoskeletal: not rigid . Psychiatric:unable to assess . Neurologic: no seizure no involuntary movements         Lab Data:   Basic Metabolic Panel: Recent Labs  Lab 07/29/20 1000 07/30/20 1115 07/30/20 1300 07/31/20 0704 08/01/20 0935 08/03/20 0449  NA 149*  --  148* 150* 150* 145  K 3.3* 3.6  --  3.6 3.6 3.5  CL 119*  --   --  119* 118* 114*  CO2 20*  --   --  22 23 22   GLUCOSE 156*  --   --  148* 149* 167*  BUN 81*  --   --  84* 82* 63*  CREATININE 1.29*  --   --  1.27* 1.26* 1.16  CALCIUM 10.1  --   --  10.3 10.1 9.3  MG  --   --   --  2.1  --  2.0    ABG: No results for input(s): PHART, PCO2ART, PO2ART, HCO3, O2SAT in the last 168 hours.  Liver Function Tests: No results for input(s): AST, ALT, ALKPHOS, BILITOT, PROT, ALBUMIN in the last 168 hours. No results for input(s): LIPASE, AMYLASE in the last 168 hours. Recent Labs  Lab 07/30/20 1115 08/01/20 0935 08/04/20 0759  AMMONIA 89* 48* 27    CBC: Recent Labs  Lab 07/29/20 1000 07/31/20 0704  08/03/20 0449  WBC 21.9* 17.9* 11.1*  HGB 11.5* 10.2* 9.9*  HCT 36.3* 33.9* 33.0*  MCV 95.0 95.2 97.1  PLT 405* 351 262    Cardiac Enzymes: No results for input(s): CKTOTAL, CKMB, CKMBINDEX, TROPONINI in the last 168 hours.  BNP (last 3 results) Recent Labs    06/29/20 0009  BNP 412.0*    ProBNP (last 3 results) No results for input(s): PROBNP in the last 8760 hours.  Radiological Exams: No results found.  Assessment/Plan Active Problems:   Acute on chronic respiratory failure with hypoxia (HCC)   Chronic anoxic encephalopathy (HCC)   Chronic atrial fibrillation (HCC)   Acute renal failure due to tubular necrosis [facility])   Cardiac arrest ([place])   1. Acute on chronic respiratory failure hypoxia plan is to continue with T collar trials titrate oxygen continue pulmonary toilet 2. Chronic anoxic encephalopathy no change we will continue to follow 3. Chronic atrial fibrillation rate controlled 4. Acute renal failure tubular necrosis improving labs 5. Cardiac arrest rhythm has been stable   I have personally seen and evaluated the patient, evaluated laboratory and imaging results, formulated the assessment and plan and placed orders. The Patient requires high complexity decision making  with multiple systems involvement.  Rounds were done with the Respiratory Therapy Director and Staff therapists and discussed with nursing staff also.  Allyne Gee, MD [facility] Pulmonary Critical Care Medicine Sleep Medicine

## 2020-08-05 LAB — RENAL FUNCTION PANEL
Albumin: 2.2 g/dL — ABNORMAL LOW (ref 3.5–5.0)
Anion gap: 10 (ref 5–15)
BUN: 60 mg/dL — ABNORMAL HIGH (ref 8–23)
CO2: 25 mmol/L (ref 22–32)
Calcium: 9.8 mg/dL (ref 8.9–10.3)
Chloride: 113 mmol/L — ABNORMAL HIGH (ref 98–111)
Creatinine, Ser: 1.04 mg/dL (ref 0.61–1.24)
GFR calc Af Amer: >60 mL/min (ref >60)
GFR calc non Af Amer: >60 mL/min (ref >60)
Glucose, Bld: 150 mg/dL — ABNORMAL HIGH (ref 70–99)
Phosphorus: 3.2 mg/dL (ref 2.5–4.6)
Potassium: 3.6 mmol/L (ref 3.5–5.1)
Sodium: 148 mmol/L — ABNORMAL HIGH (ref 135–145)

## 2020-08-05 LAB — CBC
HCT: 40.1 % (ref 39.0–52.0)
Hemoglobin: 12 g/dL — ABNORMAL LOW (ref 13.0–17.0)
MCH: 29 pg (ref 26.0–34.0)
MCHC: 29.9 g/dL — ABNORMAL LOW (ref 30.0–36.0)
MCV: 96.9 fL (ref 80.0–100.0)
Platelets: 292 10*3/uL (ref 150–400)
RBC: 4.14 MIL/uL — ABNORMAL LOW (ref 4.22–5.81)
RDW: 16.1 % — ABNORMAL HIGH (ref 11.5–15.5)
WBC: 12.7 10*3/uL — ABNORMAL HIGH (ref 4.0–10.5)
nRBC: 0 % (ref 0.0–0.2)

## 2020-08-05 LAB — MAGNESIUM: Magnesium: 2.1 mg/dL (ref 1.7–2.4)

## 2020-08-05 NOTE — Progress Notes (Addendum)
Pulmonary Critical Care Medicine [place]   PULMONARY CRITICAL CARE SERVICE  PROGRESS NOTE  Date of Service: 08/05/2020  TAGGERT BOZZI  VQQ:[id number]  DOB: Mar 01, 1946   DOA: 07/16/2020  Referring Physician: Merton Border, MD  HPI: CALOB BASKETTE is a 74 y.o. male seen for follow up of Acute on Chronic Respiratory Failure.  Patient continues on 21% aerosol trach collar at this time satting well no distress.  Medications: Reviewed on Rounds  Physical Exam:  Vitals: Pulse 90 respirations 35 BP 140/73 O2 sat 94% temp 99.3  Ventilator Settings 21% ATC  . General: Comfortable at this time . Eyes: Grossly normal lids, irises & conjunctiva . ENT: grossly tongue is normal . Neck: no obvious mass . Cardiovascular: S1 S2 normal no gallop . Respiratory: No rales or rhonchi noted . Abdomen: soft . Skin: no rash seen on limited exam . Musculoskeletal: not rigid . Psychiatric:unable to assess . Neurologic: no seizure no involuntary movements         Lab Data:   Basic Metabolic Panel: Recent Labs  Lab 07/30/20 1115 07/30/20 1300 07/31/20 0704 08/01/20 0935 08/03/20 0449 08/05/20 0833  NA  --  148* 150* 150* 145 148*  K 3.6  --  3.6 3.6 3.5 3.6  CL  --   --  119* 118* 114* 113*  CO2  --   --  22 23 22 25   GLUCOSE  --   --  148* 149* 167* 150*  BUN  --   --  84* 82* 63* 60*  CREATININE  --   --  1.27* 1.26* 1.16 1.04  CALCIUM  --   --  10.3 10.1 9.3 9.8  MG  --   --  2.1  --  2.0 2.1  PHOS  --   --   --   --   --  3.2    ABG: No results for input(s): PHART, PCO2ART, PO2ART, HCO3, O2SAT in the last 168 hours.  Liver Function Tests: Recent Labs  Lab 08/05/20 0833  ALBUMIN 2.2*   No results for input(s): LIPASE, AMYLASE in the last 168 hours. Recent Labs  Lab 07/30/20 1115 08/01/20 0935 08/04/20 0759  AMMONIA 89* 48* 27    CBC: Recent Labs  Lab 07/31/20 0704 08/03/20 0449 08/05/20 0833  WBC 17.9* 11.1* 12.7*  HGB 10.2* 9.9* 12.0*   HCT 33.9* 33.0* 40.1  MCV 95.2 97.1 96.9  PLT 351 262 292    Cardiac Enzymes: No results for input(s): CKTOTAL, CKMB, CKMBINDEX, TROPONINI in the last 168 hours.  BNP (last 3 results) Recent Labs    06/29/20 0009  BNP 412.0*    ProBNP (last 3 results) No results for input(s): PROBNP in the last 8760 hours.  Radiological Exams: No results found.  Assessment/Plan Active Problems:   Acute on chronic respiratory failure with hypoxia (HCC)   Chronic anoxic encephalopathy (HCC)   Chronic atrial fibrillation (HCC)   Acute renal failure due to tubular necrosis [facility])   Cardiac arrest ([place])   1. Acute on chronic respiratory failure hypoxia plan is to continue with T collar trials titrate oxygen continue pulmonary toilet 2. Chronic anoxic encephalopathy no change we will continue to follow 3. Chronic atrial fibrillation rate controlled 4. Acute renal failure tubular necrosis improving labs 5. Cardiac arrest rhythm has been stable   I have personally seen and evaluated the patient, evaluated laboratory and imaging results, formulated the assessment and plan and placed orders. The Patient requires high  complexity decision making with multiple systems involvement.  Rounds were done with the Respiratory Therapy Director and Staff therapists and discussed with nursing staff also.  Allyne Gee, MD [facility] Pulmonary Critical Care Medicine Sleep Medicine

## 2020-08-06 LAB — BASIC METABOLIC PANEL
Anion gap: 8 (ref 5–15)
BUN: 55 mg/dL — ABNORMAL HIGH (ref 8–23)
CO2: 27 mmol/L (ref 22–32)
Calcium: 9.5 mg/dL (ref 8.9–10.3)
Chloride: 110 mmol/L (ref 98–111)
Creatinine, Ser: 0.96 mg/dL (ref 0.61–1.24)
GFR calc Af Amer: >60 mL/min (ref >60)
GFR calc non Af Amer: >60 mL/min (ref >60)
Glucose, Bld: 146 mg/dL — ABNORMAL HIGH (ref 70–99)
Potassium: 3.7 mmol/L (ref 3.5–5.1)
Sodium: 145 mmol/L (ref 135–145)

## 2020-08-06 LAB — SARS CORONAVIRUS 2 (TAT 6-24 HRS): SARS Coronavirus 2: NEGATIVE

## 2020-08-06 NOTE — Progress Notes (Addendum)
Pulmonary Critical Care Medicine [place]   PULMONARY CRITICAL CARE SERVICE  PROGRESS NOTE  Date of Service: 08/06/2020  Christopher [name]  VVO:[id number]  DOB: 1946-06-17   DOA: 07/16/2020  Referring Physician: Merton Border, MD  HPI: LARRI YEHLE is a 74 y.o. male seen for follow up of Acute on Chronic Respiratory Failure. Patient remains on 21% T-Bar.  Sating well with no fever or distress.   Medications: Reviewed on Rounds  Physical Exam:  Vitals: Pulse 86, resp 31, bp 145/61, sat 95%, temp 99.6  Ventilator Settings 21% ATC  . General: Comfortable at this time . Eyes: Grossly normal lids, irises & conjunctiva . ENT: grossly tongue is normal . Neck: no obvious mass . Cardiovascular: S1 S2 normal no gallop . Respiratory: no rales or ronchi . Abdomen: soft . Skin: no rash seen on limited exam . Musculoskeletal: not rigid . Psychiatric:unable to assess . Neurologic: no seizure no involuntary movements         Lab Data:   Basic Metabolic Panel: Recent Labs  Lab 07/31/20 0704 08/01/20 0935 08/03/20 0449 08/05/20 0833 08/06/20 1111  NA 150* 150* 145 148* 145  K 3.6 3.6 3.5 3.6 3.7  CL 119* 118* 114* 113* 110  CO2 22 23 22 25 27   GLUCOSE 148* 149* 167* 150* 146*  BUN 84* 82* 63* 60* 55*  CREATININE 1.27* 1.26* 1.16 1.04 0.96  CALCIUM 10.3 10.1 9.3 9.8 9.5  MG 2.1  --  2.0 2.1  --   PHOS  --   --   --  3.2  --     ABG: No results for input(s): PHART, PCO2ART, PO2ART, HCO3, O2SAT in the last 168 hours.  Liver Function Tests: Recent Labs  Lab 08/05/20 0833  ALBUMIN 2.2*   No results for input(s): LIPASE, AMYLASE in the last 168 hours. Recent Labs  Lab 08/01/20 0935 08/04/20 0759  AMMONIA 48* 27    CBC: Recent Labs  Lab 07/31/20 0704 08/03/20 0449 08/05/20 0833  WBC 17.9* 11.1* 12.7*  HGB 10.2* 9.9* 12.0*  HCT 33.9* 33.0* 40.1  MCV 95.2 97.1 96.9  PLT 351 262 292    Cardiac Enzymes: No results for input(s): CKTOTAL,  CKMB, CKMBINDEX, TROPONINI in the last 168 hours.  BNP (last 3 results) Recent Labs    06/29/20 0009  BNP 412.0*    ProBNP (last 3 results) No results for input(s): PROBNP in the last 8760 hours.  Radiological Exams: No results found.  Assessment/Plan Active Problems:   Acute on chronic respiratory failure with hypoxia (HCC)   Chronic anoxic encephalopathy (HCC)   Chronic atrial fibrillation (HCC)   Acute renal failure due to tubular necrosis [facility])   Cardiac arrest ([place])   1. Acute on chronic respiratory failure hypoxia plan is to continue with T collar trials titrate oxygen continue pulmonary toilet 2. Chronic anoxic encephalopathy no change we will continue to follow 3. Chronic atrial fibrillation rate controlled 4. Acute renal failure tubular necrosis improving labs 5. Cardiac arrest rhythm has been stable   I have personally seen and evaluated the patient, evaluated laboratory and imaging results, formulated the assessment and plan and placed orders. The Patient requires high complexity decision making with multiple systems involvement.  Rounds were done with the Respiratory Therapy Director and Staff therapists and discussed with nursing staff also.  Allyne Gee, MD [facility] Pulmonary Critical Care Medicine Sleep Medicine

## 2020-08-07 NOTE — Progress Notes (Addendum)
Pulmonary Critical Care Medicine [place]   PULMONARY CRITICAL CARE SERVICE  PROGRESS NOTE  Date of Service: 08/07/2020  Christopher [name]  ZOX:[id number]  DOB: July 08, 1946   DOA: 07/16/2020  Referring Physician: Merton Border, MD  HPI: Christopher [name] is a 74 y.o. male seen for follow up of Acute on Chronic Respiratory Failure.  Patient mains on 20% T-bar satting well no fever distress  Medications: Reviewed on Rounds  Physical Exam:  Vitals: Pulse 85 respirations 24 BP 122/67 O2 sat 99% temp 98.4  Ventilator Settings ATC 28%  . General: Comfortable at this time . Eyes: Grossly normal lids, irises & conjunctiva . ENT: grossly tongue is normal . Neck: no obvious mass . Cardiovascular: S1 S2 normal no gallop . Respiratory: No rales or rhonchi noted . Abdomen: soft . Skin: no rash seen on limited exam . Musculoskeletal: not rigid . Psychiatric:unable to assess . Neurologic: no seizure no involuntary movements         Lab Data:   Basic Metabolic Panel: Recent Labs  Lab 08/01/20 0935 08/03/20 0449 08/05/20 0833 08/06/20 1111  NA 150* 145 148* 145  K 3.6 3.5 3.6 3.7  CL 118* 114* 113* 110  CO2 23 22 25 27   GLUCOSE 149* 167* 150* 146*  BUN 82* 63* 60* 55*  CREATININE 1.26* 1.16 1.04 0.96  CALCIUM 10.1 9.3 9.8 9.5  MG  --  2.0 2.1  --   PHOS  --   --  3.2  --     ABG: No results for input(s): PHART, PCO2ART, PO2ART, HCO3, O2SAT in the last 168 hours.  Liver Function Tests: Recent Labs  Lab 08/05/20 0833  ALBUMIN 2.2*   No results for input(s): LIPASE, AMYLASE in the last 168 hours. Recent Labs  Lab 08/01/20 0935 08/04/20 0759  AMMONIA 48* 27    CBC: Recent Labs  Lab 08/03/20 0449 08/05/20 0833  WBC 11.1* 12.7*  HGB 9.9* 12.0*  HCT 33.0* 40.1  MCV 97.1 96.9  PLT 262 292    Cardiac Enzymes: No results for input(s): CKTOTAL, CKMB, CKMBINDEX, TROPONINI in the last 168 hours.  BNP (last 3 results) Recent Labs     06/29/20 0009  BNP 412.0*    ProBNP (last 3 results) No results for input(s): PROBNP in the last 8760 hours.  Radiological Exams: No results found.  Assessment/Plan Active Problems:   Acute on chronic respiratory failure with hypoxia (HCC)   Chronic anoxic encephalopathy (HCC)   Chronic atrial fibrillation (HCC)   Acute renal failure due to tubular necrosis [facility])   Cardiac arrest ([place])   1. Acute on chronic respiratory failure hypoxia plan is to continue with T collar trials titrate oxygen continue pulmonary toilet 2. Chronic anoxic encephalopathy no change we will continue to follow 3. Chronic atrial fibrillation rate controlled 4. Acute renal failure tubular necrosis improving labs 5. Cardiac arrest rhythm has been stable   I have personally seen and evaluated the patient, evaluated laboratory and imaging results, formulated the assessment and plan and placed orders. The Patient requires high complexity decision making with multiple systems involvement.  Rounds were done with the Respiratory Therapy Director and Staff therapists and discussed with nursing staff also.  Allyne Gee, MD [facility] Pulmonary Critical Care Medicine Sleep Medicine

## 2020-08-08 LAB — CBC
HCT: 35 % — ABNORMAL LOW (ref 39.0–52.0)
Hemoglobin: 10.8 g/dL — ABNORMAL LOW (ref 13.0–17.0)
MCH: 29.6 pg (ref 26.0–34.0)
MCHC: 30.9 g/dL (ref 30.0–36.0)
MCV: 95.9 fL (ref 80.0–100.0)
Platelets: 252 10*3/uL (ref 150–400)
RBC: 3.65 MIL/uL — ABNORMAL LOW (ref 4.22–5.81)
RDW: 15.8 % — ABNORMAL HIGH (ref 11.5–15.5)
WBC: 10.1 10*3/uL (ref 4.0–10.5)
nRBC: 0 % (ref 0.0–0.2)

## 2020-08-08 LAB — RENAL FUNCTION PANEL
Albumin: 2 g/dL — ABNORMAL LOW (ref 3.5–5.0)
Anion gap: 9 (ref 5–15)
BUN: 49 mg/dL — ABNORMAL HIGH (ref 8–23)
CO2: 25 mmol/L (ref 22–32)
Calcium: 8.8 mg/dL — ABNORMAL LOW (ref 8.9–10.3)
Chloride: 102 mmol/L (ref 98–111)
Creatinine, Ser: 0.87 mg/dL (ref 0.61–1.24)
GFR calc Af Amer: >60 mL/min (ref >60)
GFR calc non Af Amer: >60 mL/min (ref >60)
Glucose, Bld: 135 mg/dL — ABNORMAL HIGH (ref 70–99)
Phosphorus: 3.1 mg/dL (ref 2.5–4.6)
Potassium: 3.5 mmol/L (ref 3.5–5.1)
Sodium: 136 mmol/L (ref 135–145)

## 2020-08-08 LAB — URINALYSIS, ROUTINE W REFLEX MICROSCOPIC
Bilirubin Urine: NEGATIVE
Glucose, UA: NEGATIVE mg/dL
Hgb urine dipstick: NEGATIVE
Ketones, ur: NEGATIVE mg/dL
Leukocytes,Ua: NEGATIVE
Nitrite: NEGATIVE
Protein, ur: NEGATIVE mg/dL
Specific Gravity, Urine: 1.016 (ref 1.005–1.030)
pH: 5 (ref 5.0–8.0)

## 2020-08-08 LAB — MAGNESIUM: Magnesium: 1.9 mg/dL (ref 1.7–2.4)

## 2020-08-08 LAB — SARS CORONAVIRUS 2 (TAT 6-24 HRS): SARS Coronavirus 2: NEGATIVE

## 2020-08-08 NOTE — Progress Notes (Addendum)
Pulmonary Critical Care Medicine [place]   PULMONARY CRITICAL CARE SERVICE  PROGRESS NOTE  Date of Service: 08/08/2020  HERBERTH DEHARO  CWC:[id number]  DOB: 05/19/1946   DOA: 07/16/2020  Referring Physician: Merton Border, MD  HPI: TROOPER OLANDER is a 74 y.o. male seen for follow up of Acute on Chronic Respiratory Failure.  Patient continues on 28% T-bar satting well no fever distress  Medications: Reviewed on Rounds  Physical Exam:  Vitals: Pulse 70 respirations 15 BP 108/55 O2 sat 98% temp 98.7  Ventilator Settings 28% T-bar  . General: Comfortable at this time . Eyes: Grossly normal lids, irises & conjunctiva . ENT: grossly tongue is normal . Neck: no obvious mass . Cardiovascular: S1 S2 normal no gallop . Respiratory: Coarse breath sounds . Abdomen: soft . Skin: no rash seen on limited exam . Musculoskeletal: not rigid . Psychiatric:unable to assess . Neurologic: no seizure no involuntary movements         Lab Data:   Basic Metabolic Panel: Recent Labs  Lab 08/03/20 0449 08/05/20 0833 08/06/20 1111 08/08/20 1019  NA 145 148* 145 136  K 3.5 3.6 3.7 3.5  CL 114* 113* 110 102  CO2 22 25 27 25   GLUCOSE 167* 150* 146* 135*  BUN 63* 60* 55* 49*  CREATININE 1.16 1.04 0.96 0.87  CALCIUM 9.3 9.8 9.5 8.8*  MG 2.0 2.1  --  1.9  PHOS  --  3.2  --  3.1    ABG: No results for input(s): PHART, PCO2ART, PO2ART, HCO3, O2SAT in the last 168 hours.  Liver Function Tests: Recent Labs  Lab 08/05/20 0833 08/08/20 1019  ALBUMIN 2.2* 2.0*   No results for input(s): LIPASE, AMYLASE in the last 168 hours. Recent Labs  Lab 08/04/20 0759  AMMONIA 27    CBC: Recent Labs  Lab 08/03/20 0449 08/05/20 0833 08/08/20 1019  WBC 11.1* 12.7* 10.1  HGB 9.9* 12.0* 10.8*  HCT 33.0* 40.1 35.0*  MCV 97.1 96.9 95.9  PLT 262 292 252    Cardiac Enzymes: No results for input(s): CKTOTAL, CKMB, CKMBINDEX, TROPONINI in the last 168 hours.  BNP (last  3 results) Recent Labs    06/29/20 0009  BNP 412.0*    ProBNP (last 3 results) No results for input(s): PROBNP in the last 8760 hours.  Radiological Exams: No results found.  Assessment/Plan Active Problems:   Acute on chronic respiratory failure with hypoxia (HCC)   Chronic anoxic encephalopathy (HCC)   Chronic atrial fibrillation (HCC)   Acute renal failure due to tubular necrosis [facility])   Cardiac arrest ([place])   1. Acute on chronic respiratory failure hypoxia plan is to continue with T collar trials titrate oxygen continue pulmonary toilet 2. Chronic anoxic encephalopathy no change we will continue to follow 3. Chronic atrial fibrillation rate controlled 4. Acute renal failure tubular necrosis improving labs 5. Cardiac arrest rhythm has been stable   I have personally seen and evaluated the patient, evaluated laboratory and imaging results, formulated the assessment and plan and placed orders. The Patient requires high complexity decision making with multiple systems involvement.  Rounds were done with the Respiratory Therapy Director and Staff therapists and discussed with nursing staff also.  Allyne Gee, MD [facility] Pulmonary Critical Care Medicine Sleep Medicine

## 2020-08-09 LAB — URINE CULTURE: Culture: NO GROWTH

## 2020-08-09 NOTE — Progress Notes (Signed)
Pulmonary Critical Care Medicine [place]   PULMONARY CRITICAL CARE SERVICE  PROGRESS NOTE  Date of Service: 08/09/2020  Christopher [name]  [id number]  DOB: [birth year]   DOA: 07/16/2020  Referring Physician: Merton Border, MD  HPI: Christopher [name] is a 74 y.o. male seen for follow up of Acute on Chronic Respiratory Failure.  Patient currently is on T collar has been on 28% FiO2 with good saturations  Medications: Reviewed on Rounds  Physical Exam:  Vitals: Temperature is 98.4 pulse 72 respiratory rate 32 blood pressure is 139/70 saturations 98%  Ventilator Settings on T collar with FiO2 28%  . General: Comfortable at this time . Eyes: Grossly normal lids, irises & conjunctiva . ENT: grossly tongue is normal . Neck: no obvious mass . Cardiovascular: S1 S2 normal no gallop . Respiratory: No rhonchi no rales are noted at this time . Abdomen: soft . Skin: no rash seen on limited exam . Musculoskeletal: not rigid . Psychiatric:unable to assess . Neurologic: no seizure no involuntary movements         Lab Data:   Basic Metabolic Panel: Recent Labs  Lab 08/03/20 0449 08/05/20 0833 08/06/20 1111 08/08/20 1019  NA 145 148* 145 136  K 3.5 3.6 3.7 3.5  CL 114* 113* 110 102  CO2 22 25 27 25   GLUCOSE 167* 150* 146* 135*  BUN 63* 60* 55* 49*  CREATININE 1.16 1.04 0.96 0.87  CALCIUM 9.3 9.8 9.5 8.8*  MG 2.0 2.1  --  1.9  PHOS  --  3.2  --  3.1    ABG: No results for input(s): PHART, PCO2ART, PO2ART, HCO3, O2SAT in the last 168 hours.  Liver Function Tests: Recent Labs  Lab 08/05/20 0833 08/08/20 1019  ALBUMIN 2.2* 2.0*   No results for input(s): LIPASE, AMYLASE in the last 168 hours. Recent Labs  Lab 08/04/20 0759  AMMONIA 27    CBC: Recent Labs  Lab 08/03/20 0449 08/05/20 0833 08/08/20 1019  WBC 11.1* 12.7* 10.1  HGB 9.9* 12.0* 10.8*  HCT 33.0* 40.1 35.0*  MCV 97.1 96.9 95.9  PLT 262 292 252    Cardiac Enzymes: No results  for input(s): CKTOTAL, CKMB, CKMBINDEX, TROPONINI in the last 168 hours.  BNP (last 3 results) Recent Labs    06/29/20 0009  BNP 412.0*    ProBNP (last 3 results) No results for input(s): PROBNP in the last 8760 hours.  Radiological Exams: No results found.  Assessment/Plan Active Problems:   Acute on chronic respiratory failure with hypoxia (HCC)   Chronic anoxic encephalopathy (HCC)   Chronic atrial fibrillation (HCC)   Acute renal failure due to tubular necrosis [facility])   Cardiac arrest ([place])   1. Acute on chronic respiratory failure with hypoxia we will continue with T collar trials titrate oxygen continue pulmonary toilet. 2. Chronic encephalopathy no change we will continue supportive care 3. Chronic atrial fibrillation rate controlled 4. Acute renal failure due to necrosis at baseline 5. Cardiac arrest rhythm stable we will continue to follow   I have personally seen and evaluated the patient, evaluated laboratory and imaging results, formulated the assessment and plan and placed orders. The Patient requires high complexity decision making with multiple systems involvement.  Rounds were done with the Respiratory Therapy Director and Staff therapists and discussed with nursing staff also.  Allyne Gee, MD [facility] Pulmonary Critical Care Medicine Sleep Medicine

## 2020-08-10 LAB — CBC
HCT: 36.7 % — ABNORMAL LOW (ref 39.0–52.0)
Hemoglobin: 11.4 g/dL — ABNORMAL LOW (ref 13.0–17.0)
MCH: 29.5 pg (ref 26.0–34.0)
MCHC: 31.1 g/dL (ref 30.0–36.0)
MCV: 95.1 fL (ref 80.0–100.0)
Platelets: 271 10*3/uL (ref 150–400)
RBC: 3.86 MIL/uL — ABNORMAL LOW (ref 4.22–5.81)
RDW: 15.8 % — ABNORMAL HIGH (ref 11.5–15.5)
WBC: 9.6 10*3/uL (ref 4.0–10.5)
nRBC: 0 % (ref 0.0–0.2)

## 2020-08-10 LAB — RENAL FUNCTION PANEL
Albumin: 2 g/dL — ABNORMAL LOW (ref 3.5–5.0)
Anion gap: 7 (ref 5–15)
BUN: 44 mg/dL — ABNORMAL HIGH (ref 8–23)
CO2: 28 mmol/L (ref 22–32)
Calcium: 9.2 mg/dL (ref 8.9–10.3)
Chloride: 101 mmol/L (ref 98–111)
Creatinine, Ser: 0.86 mg/dL (ref 0.61–1.24)
GFR calc Af Amer: >60 mL/min (ref >60)
GFR calc non Af Amer: >60 mL/min (ref >60)
Glucose, Bld: 134 mg/dL — ABNORMAL HIGH (ref 70–99)
Phosphorus: 2.6 mg/dL (ref 2.5–4.6)
Potassium: 3.4 mmol/L — ABNORMAL LOW (ref 3.5–5.1)
Sodium: 136 mmol/L (ref 135–145)

## 2020-08-10 LAB — CULTURE, RESPIRATORY W GRAM STAIN
Culture: NORMAL
Gram Stain: NONE SEEN

## 2020-08-10 LAB — MAGNESIUM: Magnesium: 1.8 mg/dL (ref 1.7–2.4)

## 2020-08-10 NOTE — Progress Notes (Signed)
Pulmonary Critical Care Medicine [place]   PULMONARY CRITICAL CARE SERVICE  PROGRESS NOTE  Date of Service: 08/10/2020  DOYT CASTELLANA  SNK:[id number]  DOB: [birth year]   DOA: 07/16/2020  Referring Physician: Merton Border, MD  HPI: KENTLEY BLYDEN is a 74 y.o. male seen for follow up of Acute on Chronic Respiratory Failure.  Patient currently is on T collar 20% FiO2 with good saturations.  Medications: Reviewed on Rounds  Physical Exam:  Vitals: Temperature is 97.6 pulse 94 respiratory 22 blood pressure is 109/53 saturations 97%  Ventilator Settings on T collar with an FiO2 20%  . General: Comfortable at this time . Eyes: Grossly normal lids, irises & conjunctiva . ENT: grossly tongue is normal . Neck: no obvious mass . Cardiovascular: S1 S2 normal no gallop . Respiratory: No rhonchi rales are noted . Abdomen: soft . Skin: no rash seen on limited exam . Musculoskeletal: not rigid . Psychiatric:unable to assess . Neurologic: no seizure no involuntary movements         Lab Data:   Basic Metabolic Panel: Recent Labs  Lab 08/05/20 0833 08/06/20 1111 08/08/20 1019 08/10/20 0838  NA 148* 145 136 136  K 3.6 3.7 3.5 3.4*  CL 113* 110 102 101  CO2 25 27 25 28   GLUCOSE 150* 146* 135* 134*  BUN 60* 55* 49* 44*  CREATININE 1.04 0.96 0.87 0.86  CALCIUM 9.8 9.5 8.8* 9.2  MG 2.1  --  1.9 1.8  PHOS 3.2  --  3.1 2.6    ABG: No results for input(s): PHART, PCO2ART, PO2ART, HCO3, O2SAT in the last 168 hours.  Liver Function Tests: Recent Labs  Lab 08/05/20 0833 08/08/20 1019 08/10/20 0838  ALBUMIN 2.2* 2.0* 2.0*   No results for input(s): LIPASE, AMYLASE in the last 168 hours. Recent Labs  Lab 08/04/20 0759  AMMONIA 27    CBC: Recent Labs  Lab 08/05/20 0833 08/08/20 1019 08/10/20 0838  WBC 12.7* 10.1 9.6  HGB 12.0* 10.8* 11.4*  HCT 40.1 35.0* 36.7*  MCV 96.9 95.9 95.1  PLT 292 252 271    Cardiac Enzymes: No results for  input(s): CKTOTAL, CKMB, CKMBINDEX, TROPONINI in the last 168 hours.  BNP (last 3 results) Recent Labs    06/29/20 0009  BNP 412.0*    ProBNP (last 3 results) No results for input(s): PROBNP in the last 8760 hours.  Radiological Exams: No results found.  Assessment/Plan Active Problems:   Acute on chronic respiratory failure with hypoxia (HCC)   Chronic anoxic encephalopathy (HCC)   Chronic atrial fibrillation (HCC)   Acute renal failure due to tubular necrosis [facility])   Cardiac arrest ([place])   1. Acute on chronic respiratory failure hypoxia we will continue with oxygen therapy and pulmonary toileting.  Secretions right now copious 2. Chronic anoxic encephalopathy no change continue to follow along 3. Chronic atrial fibrillation rate is controlled 4. HTN supportive care monitoring labs 5. Cardiac arrest rhythm stable   I have personally seen and evaluated the patient, evaluated laboratory and imaging results, formulated the assessment and plan and placed orders. The Patient requires high complexity decision making with multiple systems involvement.  Rounds were done with the Respiratory Therapy Director and Staff therapists and discussed with nursing staff also.  Allyne Gee, MD [facility] Pulmonary Critical Care Medicine Sleep Medicine

## 2020-08-11 ENCOUNTER — Other Ambulatory Visit (HOSPITAL_COMMUNITY): Payer: Self-pay

## 2020-08-11 LAB — POTASSIUM: Potassium: 3.7 mmol/L (ref 3.5–5.1)

## 2020-08-11 NOTE — Progress Notes (Signed)
Pulmonary Critical Care Medicine [place]   PULMONARY CRITICAL CARE SERVICE  PROGRESS NOTE  Date of Service: 08/11/2020  Christopher [name]  TIR:[id number]  DOB: [birth year]   DOA: 07/16/2020  Referring Physician: Merton Border, MD  HPI: Christopher [name] is a 74 y.o. male seen for follow up of Acute on Chronic Respiratory Failure.  Patient is on T collar currently on 28% FiO2 good saturations are noted.  Secretions are fair to moderate low-grade temperature was also noted  Medications: Reviewed on Rounds  Physical Exam:  Vitals: Temperature is 100.1 pulse 89 respiratory rate 35 blood pressure is 135/60 saturations 96%  Ventilator Settings on T collar FiO2 20%  . General: Comfortable at this time . Eyes: Grossly normal lids, irises & conjunctiva . ENT: grossly tongue is normal . Neck: no obvious mass . Cardiovascular: S1 S2 normal no gallop . Respiratory: No rhonchi no rales are noted at this time . Abdomen: soft . Skin: no rash seen on limited exam . Musculoskeletal: not rigid . Psychiatric:unable to assess . Neurologic: no seizure no involuntary movements         Lab Data:   Basic Metabolic Panel: Recent Labs  Lab 08/05/20 0833 08/06/20 1111 08/08/20 1019 08/10/20 0838 08/11/20 0548  NA 148* 145 136 136  --   K 3.6 3.7 3.5 3.4* 3.7  CL 113* 110 102 101  --   CO2 25 27 25 28   --   GLUCOSE 150* 146* 135* 134*  --   BUN 60* 55* 49* 44*  --   CREATININE 1.04 0.96 0.87 0.86  --   CALCIUM 9.8 9.5 8.8* 9.2  --   MG 2.1  --  1.9 1.8  --   PHOS 3.2  --  3.1 2.6  --     ABG: No results for input(s): PHART, PCO2ART, PO2ART, HCO3, O2SAT in the last 168 hours.  Liver Function Tests: Recent Labs  Lab 08/05/20 0833 08/08/20 1019 08/10/20 0838  ALBUMIN 2.2* 2.0* 2.0*   No results for input(s): LIPASE, AMYLASE in the last 168 hours. No results for input(s): AMMONIA in the last 168 hours.  CBC: Recent Labs  Lab 08/05/20 0833 08/08/20 1019  08/10/20 0838  WBC 12.7* 10.1 9.6  HGB 12.0* 10.8* 11.4*  HCT 40.1 35.0* 36.7*  MCV 96.9 95.9 95.1  PLT 292 252 271    Cardiac Enzymes: No results for input(s): CKTOTAL, CKMB, CKMBINDEX, TROPONINI in the last 168 hours.  BNP (last 3 results) Recent Labs    06/29/20 0009  BNP 412.0*    ProBNP (last 3 results) No results for input(s): PROBNP in the last 8760 hours.  Radiological Exams: No results found.  Assessment/Plan Active Problems:   Acute on chronic respiratory failure with hypoxia (HCC)   Chronic anoxic encephalopathy (HCC)   Chronic atrial fibrillation (HCC)   Acute renal failure due to tubular necrosis [facility])   Cardiac arrest ([place])   1. Acute on chronic respiratory failure hypoxia we will continue with T collar trials titrate oxygen continue pulmonary toilet. 2. Chronic anoxic encephalopathy no change continue to follow along 3. Chronic atrial fibrillation rate is controlled 4. Acute renal failure with tubular necrosis patient is at baseline we will continue supportive care 5. Cardiac arrest rhythm has been stable   I have personally seen and evaluated the patient, evaluated laboratory and imaging results, formulated the assessment and plan and placed orders. The Patient requires high complexity decision making with multiple systems involvement.  Rounds were done with the Respiratory Therapy Director and Staff therapists and discussed with nursing staff also.  Allyne Gee, MD [facility] Pulmonary Critical Care Medicine Sleep Medicine

## 2020-08-12 NOTE — Progress Notes (Addendum)
Pulmonary Critical Care Medicine [place]   PULMONARY CRITICAL CARE SERVICE  PROGRESS NOTE  Date of Service: 08/12/2020  ELBER GALYEAN  UEA:[id number]  DOB: 1945/12/19   DOA: 07/16/2020  Referring Physician: Merton Border, MD  HPI: Christopher [name] is a 74 y.o. male seen for follow up of Acute on Chronic Respiratory Failure. Patient remains on 28% T-Bar.  sating well with no distress noted.   Medications: Reviewed on Rounds  Physical Exam:  Vitals: pulse 88, resp 26, bp 135/55, o2 sat 96%, temp 98.0  Ventilator Settings 28% ATC  . General: Comfortable at this time . Eyes: Grossly normal lids, irises & conjunctiva . ENT: grossly tongue is normal . Neck: no obvious mass . Cardiovascular: S1 S2 normal no gallop . Respiratory: no rales or ronchi noted . Abdomen: soft . Skin: no rash seen on limited exam . Musculoskeletal: not rigid . Psychiatric:unable to assess . Neurologic: no seizure no involuntary movements         Lab Data:   Basic Metabolic Panel: Recent Labs  Lab 08/06/20 1111 08/08/20 1019 08/10/20 0838 08/11/20 0548  NA 145 136 136  --   K 3.7 3.5 3.4* 3.7  CL 110 102 101  --   CO2 27 25 28   --   GLUCOSE 146* 135* 134*  --   BUN 55* 49* 44*  --   CREATININE 0.96 0.87 0.86  --   CALCIUM 9.5 8.8* 9.2  --   MG  --  1.9 1.8  --   PHOS  --  3.1 2.6  --     ABG: No results for input(s): PHART, PCO2ART, PO2ART, HCO3, O2SAT in the last 168 hours.  Liver Function Tests: Recent Labs  Lab 08/08/20 1019 08/10/20 0838  ALBUMIN 2.0* 2.0*   No results for input(s): LIPASE, AMYLASE in the last 168 hours. No results for input(s): AMMONIA in the last 168 hours.  CBC: Recent Labs  Lab 08/08/20 1019 08/10/20 0838  WBC 10.1 9.6  HGB 10.8* 11.4*  HCT 35.0* 36.7*  MCV 95.9 95.1  PLT 252 271    Cardiac Enzymes: No results for input(s): CKTOTAL, CKMB, CKMBINDEX, TROPONINI in the last 168 hours.  BNP (last 3 results) Recent Labs     06/29/20 0009  BNP 412.0*    ProBNP (last 3 results) No results for input(s): PROBNP in the last 8760 hours.  Radiological Exams: DG CHEST PORT 1 VIEW  Result Date: 08/11/2020 CLINICAL DATA:  Fever. EXAM: PORTABLE CHEST 1 VIEW COMPARISON:  07/28/2020 FINDINGS: The tracheostomy tube remains in satisfactory position. Normal sized heart. No significant change in diffuse prominence of the interstitial markings. Small amount of patchy density at both lung bases, greater on the right. Unremarkable bones. IMPRESSION: 1. Small amount of patchy atelectasis or pneumonia at both lung bases, greater on the right. 2. Stable chronic interstitial lung disease. Electronically Signed   By: Claudie Revering M.D.   On: 08/11/2020 13:50    Assessment/Plan Active Problems:   Acute on chronic respiratory failure with hypoxia (HCC)   Chronic anoxic encephalopathy (HCC)   Chronic atrial fibrillation (HCC)   Acute renal failure due to tubular necrosis [facility])   Cardiac arrest ([place])   1. Acute on chronic respiratory failure hypoxia we will continue with T collar trials titrate oxygen continue pulmonary toilet. 2. Chronic anoxic encephalopathy no change continue to follow along 3. Chronic atrial fibrillation rate is controlled 4. Acute renal failure with tubular necrosis patient is  at baseline we will continue supportive care 5. Cardiac arrest rhythm has been stable   I have personally seen and evaluated the patient, evaluated laboratory and imaging results, formulated the assessment and plan and placed orders. The Patient requires high complexity decision making with multiple systems involvement.  Rounds were done with the Respiratory Therapy Director and Staff therapists and discussed with nursing staff also.  Allyne Gee, MD [facility] Pulmonary Critical Care Medicine Sleep Medicine

## 2020-12-06 DEATH — deceased

## 2021-12-23 IMAGING — DX DG CHEST 1V PORT
1 series · 1 of 1 positions shown · non-contrast
Comparison: 06/29/2020 at [DATE] a.m.

CLINICAL DATA: Hypoxia, decreased breath sounds on left

EXAM:
PORTABLE CHEST 1 VIEW

[chest ap]
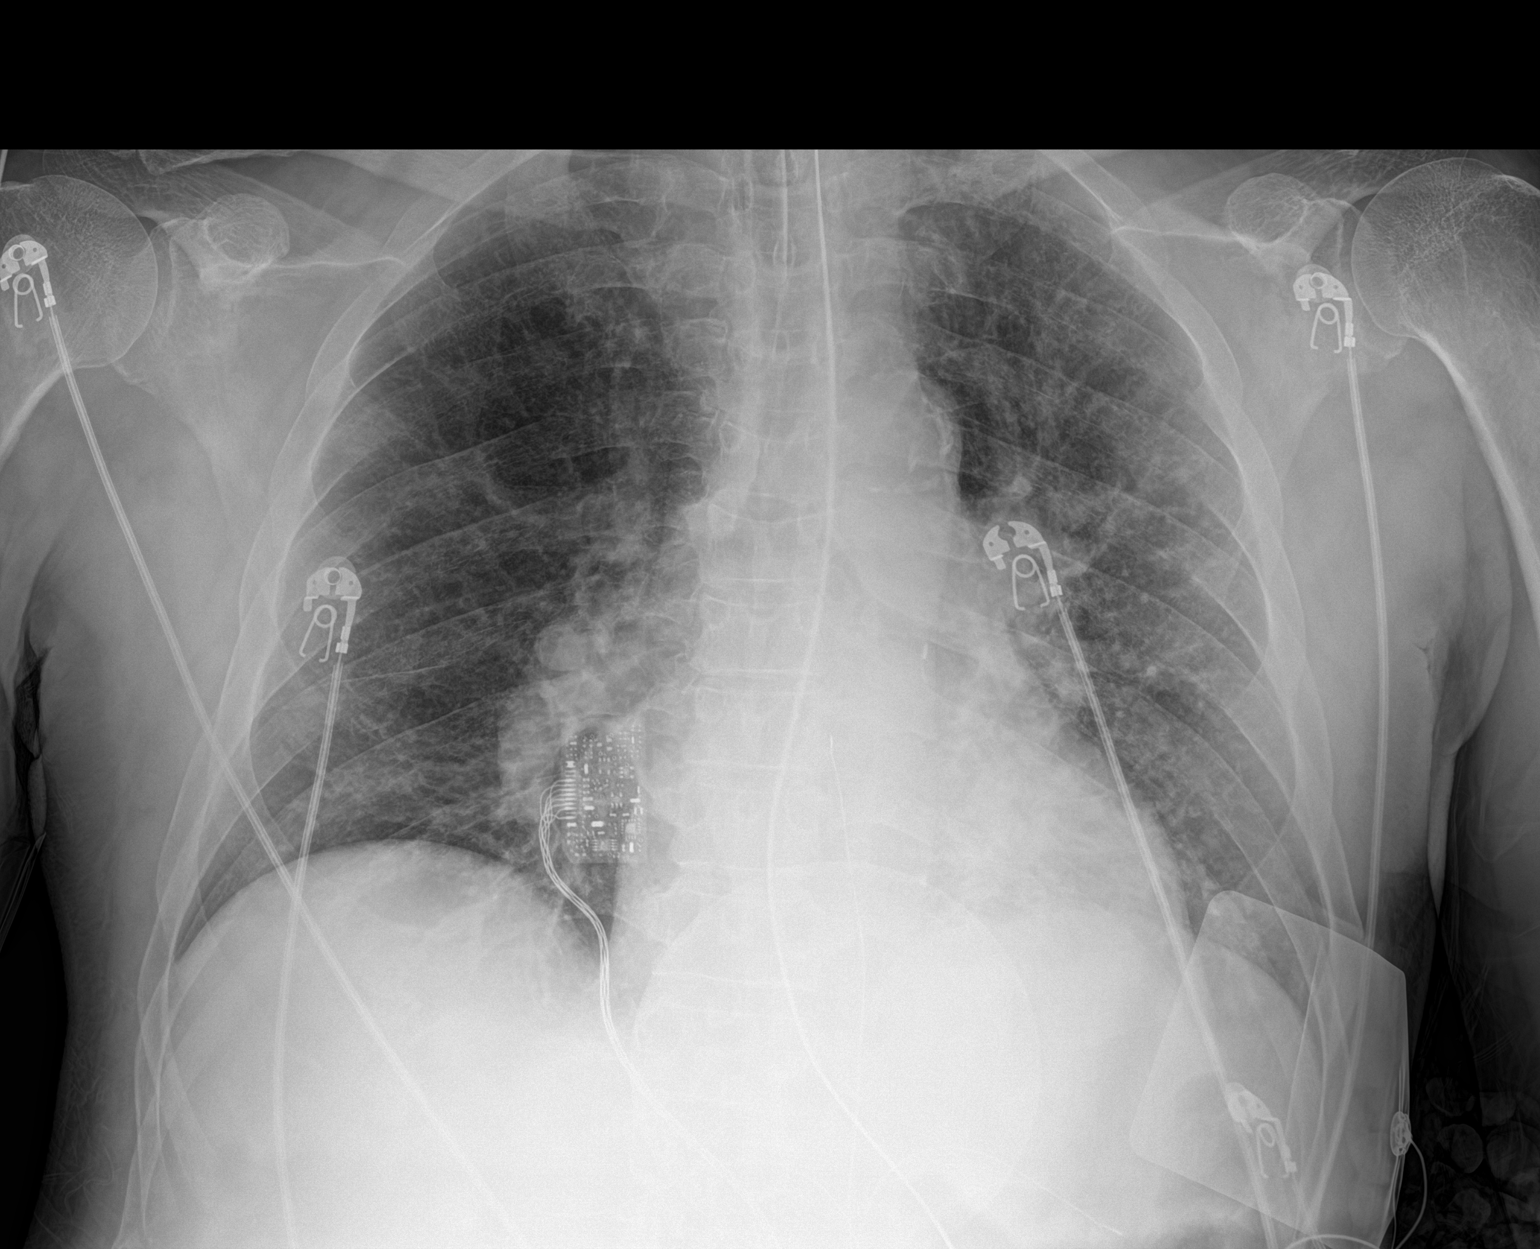

[1 of 1 positions shown; findings below may reference images not displayed]

FINDINGS: Single frontal view of the chest demonstrates stable endotracheal
tube, enteric catheter, and external defibrillator pads. The cardiac
silhouette is stable. There is persistent diffuse interstitial
prominence. There is developing consolidation within the left lung
base, which may reflect airspace disease or atelectasis. No effusion
or pneumothorax. No acute bony abnormality.
IMPRESSION: 1. Stable support devices.
2. Developing left basilar consolidation which may reflect airspace
disease, aspiration, or atelectasis.

## 2021-12-23 IMAGING — CT CT HEAD W/O CM
4 series · 16 of 47 positions shown, 18 images · non-contrast
Comparison: MRI dated 01/13/2017

CLINICAL DATA: Mental status change with unknown cause.

EXAM:
CT HEAD WITHOUT CONTRAST
TECHNIQUE: Contiguous axial images were obtained from the base of the skull
through the vertex without intravenous contrast.

[Series 3: head wo · axial · 0.48mm/px · z∈[-159,-24]mm · 7 of 37 slices shown, 9 images]
[im 5/37  brain]
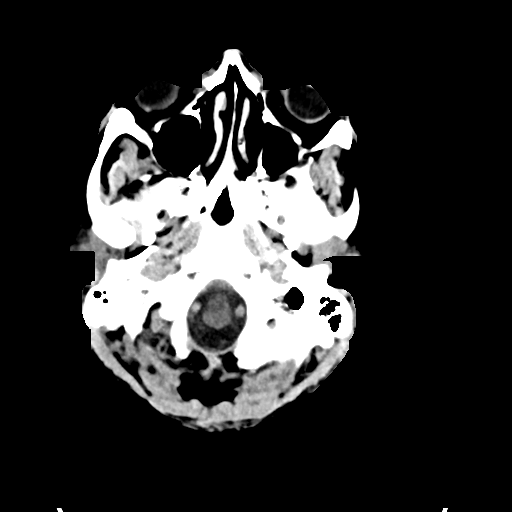
[im 5/37  bone]
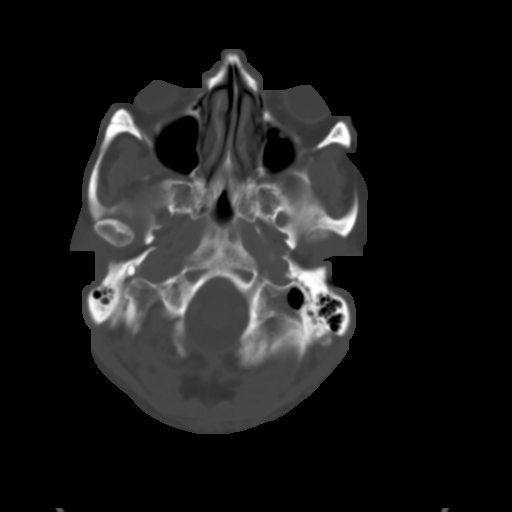
[im 10/37  brain]
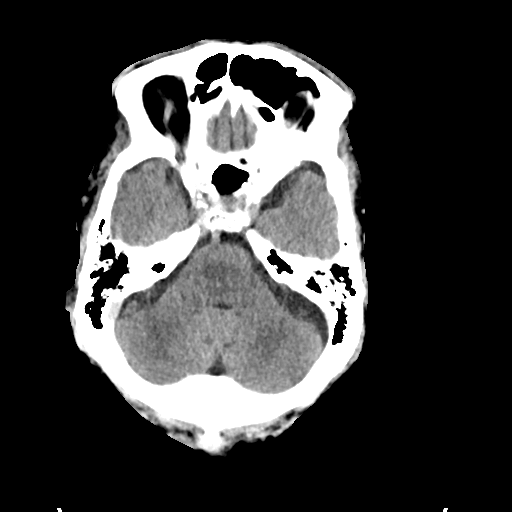
[im 14/37  brain]
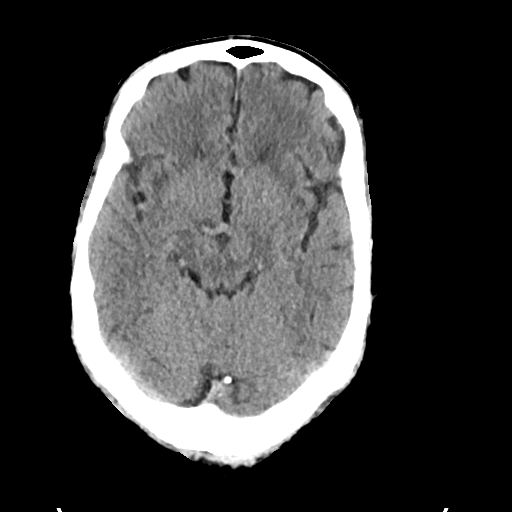
[im 19/37  brain]
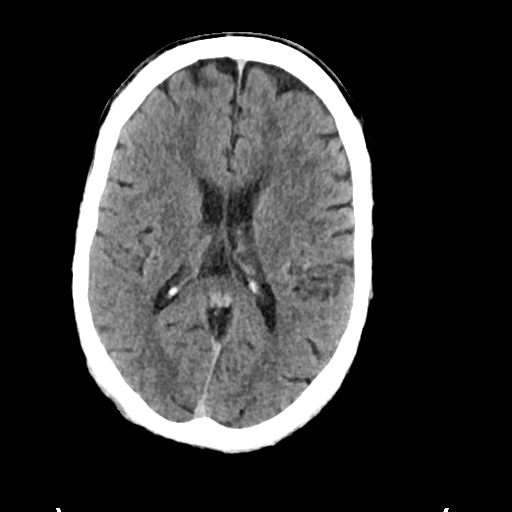
[im 23/37  brain]
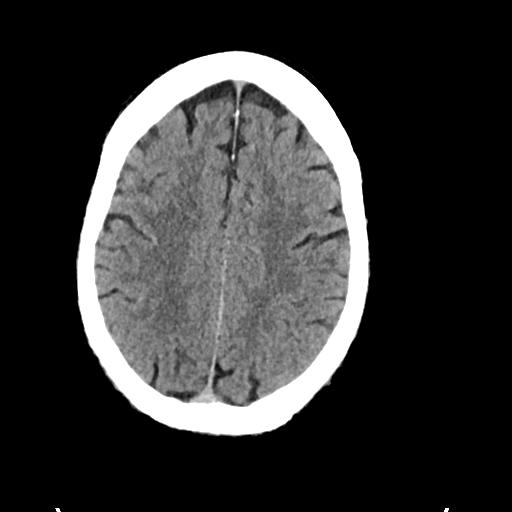
[im 23/37  bone]
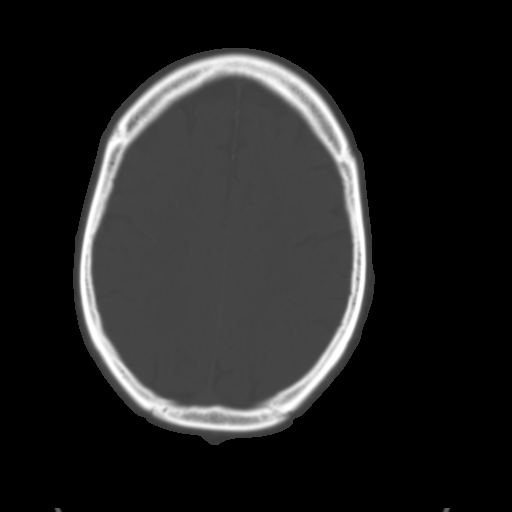
[im 28/37  brain]
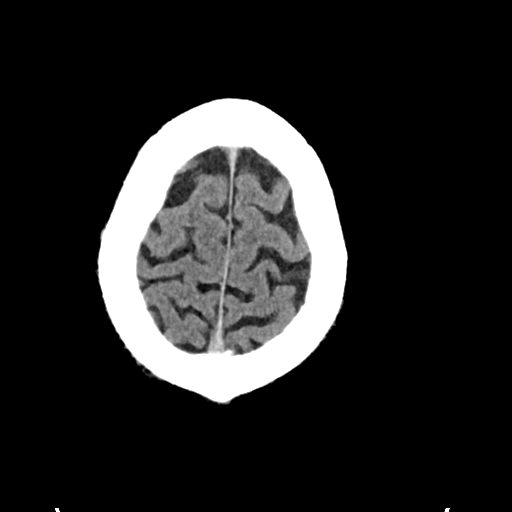
[im 32/37  brain]
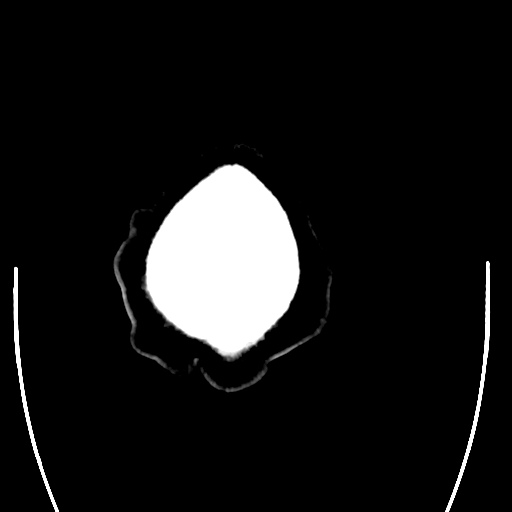

[Series 4: head bone · axial · 0.48mm/px · z∈[-161,-125]mm · 3 of 91 slices shown]
[im 10/91  bone]
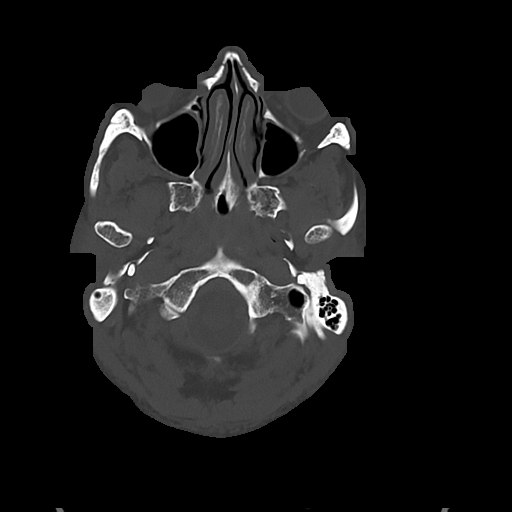
[im 19/91  bone]
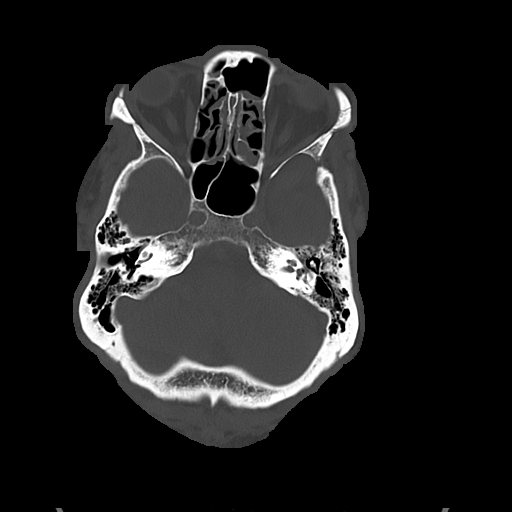
[im 28/91  bone]
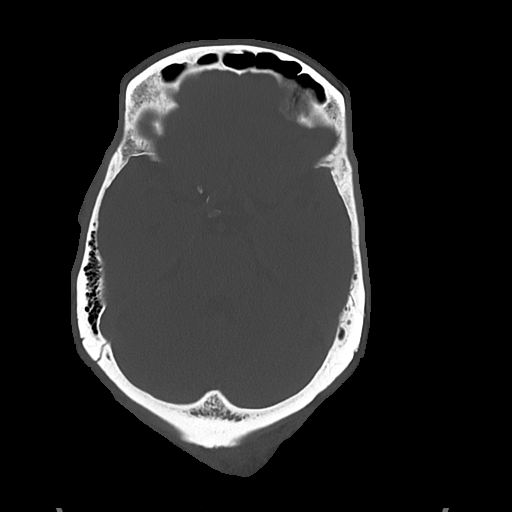

[Series 5: cor soft · coronal · 0.34mm/px · 3 of 77 slices shown]
[im 26/77  brain]
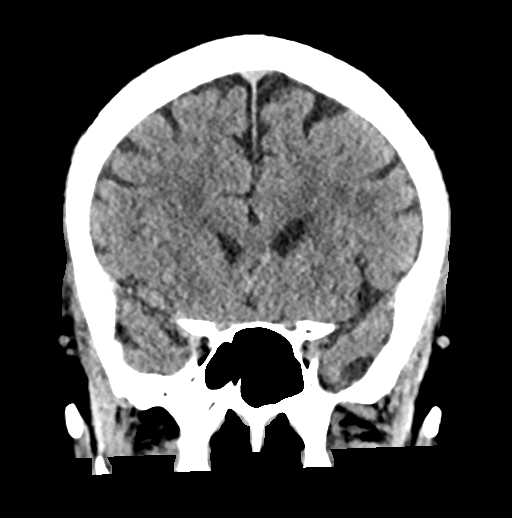
[im 34/77  brain]
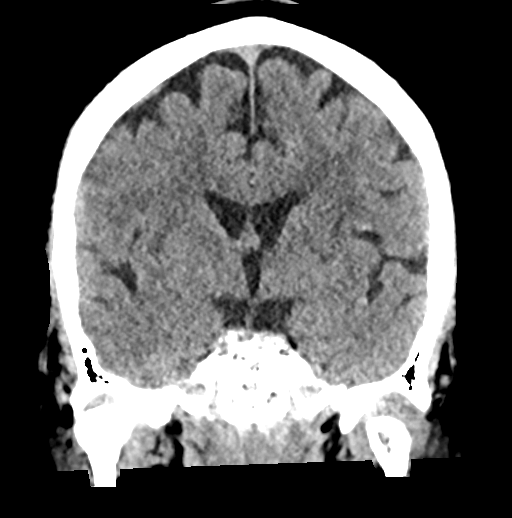
[im 43/77  brain]
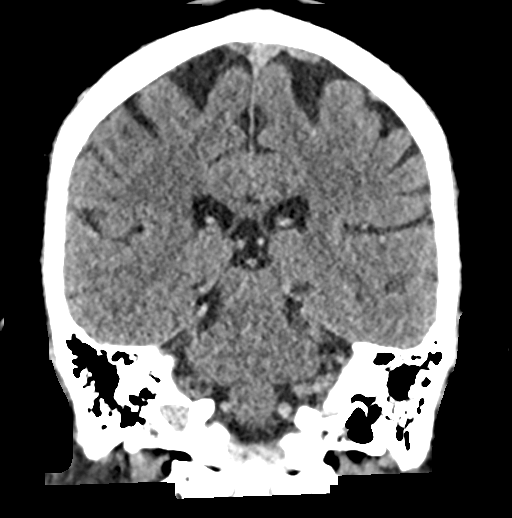

[Series 6: sag soft · sagittal · 0.35mm/px · 3 of 59 slices shown]
[im 20/59  brain]
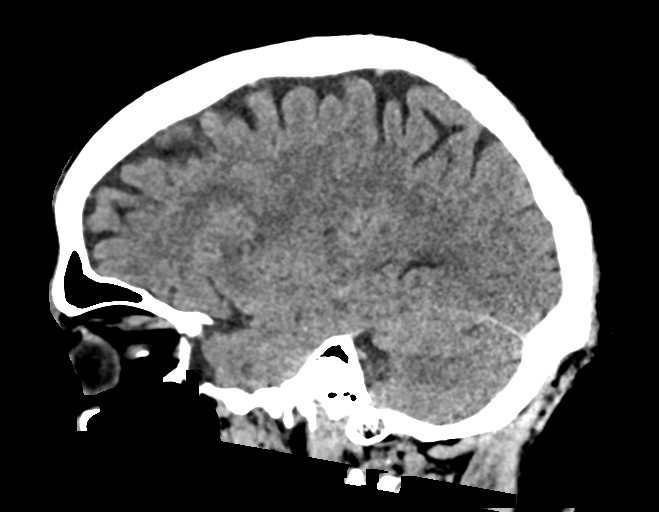
[im 30/59  brain]
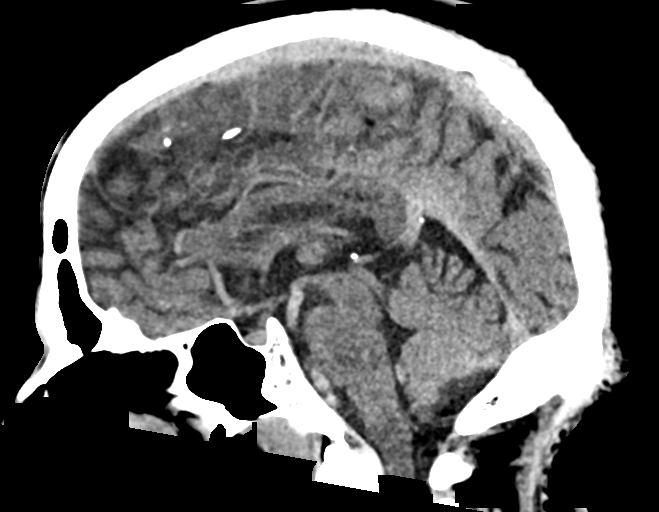
[im 39/59  brain]
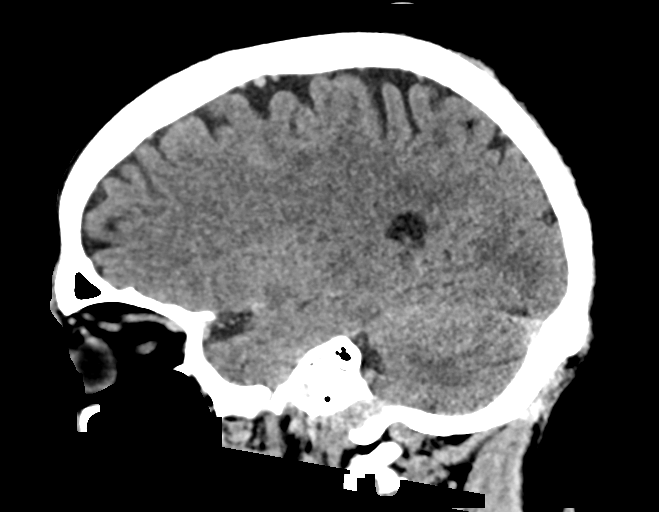

[16 of 47 positions shown; findings below may reference images not displayed]

FINDINGS: Brain: No hemorrhage. No extraaxial collection.No midline shift.
There is mild atrophy.The basal cisterns are unremarkable.
Asymmetric relatively confluent white matter disease is noted,
greatest in the left frontal lobe. This has progressed since the
patient's prior MRI given differences and technique. There is an old
right basal ganglia lacunar infarct. The brainstem is unremarkable.
The cerebellum is unremarkable. The sella is unremarkable.

Vascular: No hyperdense vessel or unexpected calcification.

Skull: The calvarium is unremarkable. The skull base is
unremarkable. The visualized upper cervical spine is unremarkable.

Sinuses/Orbits: The visualized orbits are unremarkable. The
paranasal sinuses are unremarkable. The mastoid air cells are clear.

Other: The visualized parotid gland is unremarkable. There is no
scalp soft tissue swelling.
IMPRESSION: 1. No acute intracranial abnormality.
2. Asymmetric relatively confluent white matter disease, greatest in
the left frontal lobe. This has progressed since the patient's prior
MRI given differences and technique. This is nonspecific but can be
seen in the setting of chronic microvascular ischemia.
